# Patient Record
Sex: Female | Born: 1955 | Race: Black or African American | Hispanic: No | Smoking: Current every day smoker
Health system: Southern US, Community
[De-identification: ages and names within clinical notes are randomized; demographics above are authoritative.]

## PROBLEM LIST (undated history)

## (undated) DIAGNOSIS — C801 Malignant (primary) neoplasm, unspecified: Secondary | ICD-10-CM

## (undated) DIAGNOSIS — Z720 Tobacco use: Secondary | ICD-10-CM

## (undated) DIAGNOSIS — Z9221 Personal history of antineoplastic chemotherapy: Secondary | ICD-10-CM

## (undated) DIAGNOSIS — D869 Sarcoidosis, unspecified: Secondary | ICD-10-CM

## (undated) DIAGNOSIS — Z923 Personal history of irradiation: Secondary | ICD-10-CM

## (undated) DIAGNOSIS — C50912 Malignant neoplasm of unspecified site of left female breast: Secondary | ICD-10-CM

## (undated) DIAGNOSIS — J449 Chronic obstructive pulmonary disease, unspecified: Secondary | ICD-10-CM

## (undated) DIAGNOSIS — E119 Type 2 diabetes mellitus without complications: Secondary | ICD-10-CM

## (undated) DIAGNOSIS — I427 Cardiomyopathy due to drug and external agent: Secondary | ICD-10-CM

## (undated) DIAGNOSIS — I10 Essential (primary) hypertension: Secondary | ICD-10-CM

## (undated) DIAGNOSIS — L509 Urticaria, unspecified: Secondary | ICD-10-CM

## (undated) DIAGNOSIS — T451X5A Adverse effect of antineoplastic and immunosuppressive drugs, initial encounter: Secondary | ICD-10-CM

## (undated) HISTORY — DX: Cardiomyopathy due to drug and external agent: I42.7

## (undated) HISTORY — PX: OTHER SURGICAL HISTORY: SHX169

## (undated) HISTORY — PX: DILATION AND CURETTAGE OF UTERUS: SHX78

## (undated) HISTORY — DX: Tobacco use: Z72.0

## (undated) HISTORY — PX: GANGLION CYST EXCISION: SHX1691

## (undated) HISTORY — PX: ABDOMINAL HYSTERECTOMY: SHX81

## (undated) HISTORY — DX: Chronic obstructive pulmonary disease, unspecified: J44.9

## (undated) HISTORY — DX: Malignant neoplasm of unspecified site of left female breast: C50.912

## (undated) HISTORY — PX: ECTOPIC PREGNANCY SURGERY: SHX613

## (undated) HISTORY — DX: Urticaria, unspecified: L50.9

## (undated) HISTORY — PX: INGUINAL HERNIA REPAIR: SUR1180

## (undated) HISTORY — DX: Sarcoidosis, unspecified: D86.9

## (undated) HISTORY — DX: Adverse effect of antineoplastic and immunosuppressive drugs, initial encounter: T45.1X5A

---

## 2001-10-23 ENCOUNTER — Ambulatory Visit (HOSPITAL_COMMUNITY): Admission: RE | Admit: 2001-10-23 | Discharge: 2001-10-23 | Payer: Self-pay | Admitting: Internal Medicine

## 2001-10-23 ENCOUNTER — Encounter: Payer: Self-pay | Admitting: Internal Medicine

## 2002-06-09 ENCOUNTER — Encounter: Payer: Self-pay | Admitting: Internal Medicine

## 2002-06-09 ENCOUNTER — Ambulatory Visit (HOSPITAL_COMMUNITY): Admission: RE | Admit: 2002-06-09 | Discharge: 2002-06-09 | Payer: Self-pay | Admitting: Internal Medicine

## 2002-11-11 ENCOUNTER — Encounter: Payer: Self-pay | Admitting: Internal Medicine

## 2002-11-11 ENCOUNTER — Ambulatory Visit (HOSPITAL_COMMUNITY): Admission: RE | Admit: 2002-11-11 | Discharge: 2002-11-11 | Payer: Self-pay | Admitting: Internal Medicine

## 2003-02-12 ENCOUNTER — Ambulatory Visit (HOSPITAL_COMMUNITY): Admission: RE | Admit: 2003-02-12 | Discharge: 2003-02-12 | Payer: Self-pay | Admitting: Internal Medicine

## 2003-02-12 ENCOUNTER — Encounter: Payer: Self-pay | Admitting: Internal Medicine

## 2004-12-06 ENCOUNTER — Ambulatory Visit (HOSPITAL_COMMUNITY): Admission: RE | Admit: 2004-12-06 | Discharge: 2004-12-06 | Payer: Self-pay | Admitting: Internal Medicine

## 2005-12-08 ENCOUNTER — Ambulatory Visit (HOSPITAL_COMMUNITY): Admission: RE | Admit: 2005-12-08 | Discharge: 2005-12-08 | Payer: Self-pay | Admitting: Internal Medicine

## 2006-03-01 ENCOUNTER — Ambulatory Visit (HOSPITAL_COMMUNITY): Admission: RE | Admit: 2006-03-01 | Discharge: 2006-03-01 | Payer: Self-pay | Admitting: Internal Medicine

## 2008-01-17 ENCOUNTER — Ambulatory Visit (HOSPITAL_COMMUNITY): Admission: RE | Admit: 2008-01-17 | Discharge: 2008-01-17 | Payer: Self-pay | Admitting: Internal Medicine

## 2008-01-22 ENCOUNTER — Ambulatory Visit (HOSPITAL_COMMUNITY): Admission: RE | Admit: 2008-01-22 | Discharge: 2008-01-22 | Payer: Self-pay | Admitting: Internal Medicine

## 2008-03-04 ENCOUNTER — Ambulatory Visit: Payer: Self-pay | Admitting: Orthopedic Surgery

## 2008-03-04 DIAGNOSIS — M6740 Ganglion, unspecified site: Secondary | ICD-10-CM | POA: Insufficient documentation

## 2008-07-27 ENCOUNTER — Ambulatory Visit (HOSPITAL_COMMUNITY): Admission: RE | Admit: 2008-07-27 | Discharge: 2008-07-27 | Payer: Self-pay | Admitting: Internal Medicine

## 2009-05-27 ENCOUNTER — Ambulatory Visit (HOSPITAL_COMMUNITY): Admission: RE | Admit: 2009-05-27 | Discharge: 2009-05-27 | Payer: Self-pay | Admitting: Internal Medicine

## 2010-06-22 ENCOUNTER — Ambulatory Visit (HOSPITAL_COMMUNITY): Admission: RE | Admit: 2010-06-22 | Discharge: 2010-06-22 | Payer: Self-pay | Admitting: Internal Medicine

## 2011-01-14 ENCOUNTER — Encounter: Payer: Self-pay | Admitting: Internal Medicine

## 2011-01-15 ENCOUNTER — Encounter: Payer: Self-pay | Admitting: Internal Medicine

## 2011-11-12 DIAGNOSIS — I10 Essential (primary) hypertension: Secondary | ICD-10-CM | POA: Insufficient documentation

## 2012-01-02 ENCOUNTER — Other Ambulatory Visit (HOSPITAL_COMMUNITY): Payer: Self-pay | Admitting: Internal Medicine

## 2012-01-02 DIAGNOSIS — Z139 Encounter for screening, unspecified: Secondary | ICD-10-CM

## 2012-01-03 ENCOUNTER — Other Ambulatory Visit: Payer: Self-pay

## 2012-01-03 ENCOUNTER — Telehealth: Payer: Self-pay

## 2012-01-03 DIAGNOSIS — Z139 Encounter for screening, unspecified: Secondary | ICD-10-CM

## 2012-01-03 NOTE — Telephone Encounter (Signed)
MOVI PREP SPLIT DOSING, REGULAR BREAKFAST. CLEAR LIQUIDS AFTER 9 AM.  

## 2012-01-03 NOTE — Telephone Encounter (Signed)
FAXING RX AND INSTRUCTIONS TO [facility] ON [address] IN [place]....FAX NUMBER  [phone]

## 2012-01-03 NOTE — Telephone Encounter (Signed)
Gastroenterology Pre-Procedure Form   Request Date: 01/03/2012       Requesting Physician: Dr. Felecia Shelling     PATIENT INFORMATION:  Sally [name] is a 56 y.o., female (DOB=[birth year]).  PROCEDURE: Procedure(s) requested: colonoscopy Procedure Reason: screening for colon cancer  PATIENT REVIEW QUESTIONS: The patient reports the following:   1. Diabetes Melitis: no 2. Joint replacements in the past 12 months: no 3. Major health problems in the past 3 months: no 4. Has an artificial valve or MVP:no 5. Has been advised in past to take antibiotics in advance of a procedure like teeth cleaning: no}    MEDICATIONS & ALLERGIES:    Patient reports the following regarding taking any blood thinners:   Plavix? no Aspirin?no Coumadin?  no  Patient confirms/reports the following medications:  Current Outpatient Prescriptions  Medication Sig Dispense Refill  . atorvastatin (LIPITOR) 10 MG tablet Take 10 mg by mouth daily.      . NON FORMULARY OTC allergy pills..... Only as needed      . triamterene-hydrochlorothiazide (DYAZIDE) 37.5-25 MG per capsule Take 1 capsule by mouth once.        Patient confirms/reports the following allergies:  No Known Allergies  Patient is appropriate to schedule for requested procedure(s): yes  AUTHORIZATION INFORMATION Primary Insurance:   ID #:   Group #:  Pre-Cert / Auth required Pre-Cert / Auth #:   Secondary Insurance: ID #:   Group #:  Pre-Cert / Auth required:  Pre-Cert / Auth #:  No orders of the defined types were placed in this encounter.    SCHEDULE INFORMATION: Procedure has been scheduled as follows:  Date: 01/09/2012         Time: 1:30 PM  Location: [facility] Short Stay  This Gastroenterology Pre-Precedure Form is being routed to the following provider(s) for review: Jonette Eva, MD  CAN [facility] AND [facility] [place] ON [address]

## 2012-01-04 ENCOUNTER — Ambulatory Visit (HOSPITAL_COMMUNITY)
Admission: RE | Admit: 2012-01-04 | Discharge: 2012-01-04 | Disposition: A | Discharge status: Home / Self Care | Payer: BC Managed Care – PPO | Source: Ambulatory Visit | Attending: Internal Medicine | Admitting: Internal Medicine

## 2012-01-04 DIAGNOSIS — Z1231 Encounter for screening mammogram for malignant neoplasm of breast: Secondary | ICD-10-CM | POA: Insufficient documentation

## 2012-01-04 DIAGNOSIS — Z139 Encounter for screening, unspecified: Secondary | ICD-10-CM

## 2012-01-05 ENCOUNTER — Encounter (HOSPITAL_COMMUNITY): Payer: Self-pay

## 2012-01-08 MED ORDER — SODIUM CHLORIDE 0.45 % IV SOLN
Freq: Once | INTRAVENOUS | Status: AC
  Administered 2012-01-09: 1000 mL via INTRAVENOUS

## 2012-01-09 ENCOUNTER — Encounter (HOSPITAL_COMMUNITY): Payer: Self-pay | Admitting: *Deleted

## 2012-01-09 ENCOUNTER — Ambulatory Visit (HOSPITAL_COMMUNITY)
Admission: RE | Admit: 2012-01-09 | Discharge: 2012-01-09 | Disposition: A | Discharge status: Home / Self Care | Payer: BC Managed Care – PPO | Source: Ambulatory Visit | Attending: Gastroenterology | Admitting: Gastroenterology

## 2012-01-09 ENCOUNTER — Encounter (HOSPITAL_COMMUNITY)
Admission: RE | Disposition: A | Discharge status: Home / Self Care | Payer: Self-pay | Source: Ambulatory Visit | Attending: Gastroenterology

## 2012-01-09 ENCOUNTER — Other Ambulatory Visit: Payer: Self-pay | Admitting: Gastroenterology

## 2012-01-09 DIAGNOSIS — K5730 Diverticulosis of large intestine without perforation or abscess without bleeding: Secondary | ICD-10-CM | POA: Insufficient documentation

## 2012-01-09 DIAGNOSIS — Z1211 Encounter for screening for malignant neoplasm of colon: Secondary | ICD-10-CM

## 2012-01-09 DIAGNOSIS — D126 Benign neoplasm of colon, unspecified: Secondary | ICD-10-CM

## 2012-01-09 DIAGNOSIS — D128 Benign neoplasm of rectum: Secondary | ICD-10-CM | POA: Insufficient documentation

## 2012-01-09 DIAGNOSIS — Z79899 Other long term (current) drug therapy: Secondary | ICD-10-CM | POA: Insufficient documentation

## 2012-01-09 DIAGNOSIS — K648 Other hemorrhoids: Secondary | ICD-10-CM

## 2012-01-09 DIAGNOSIS — I10 Essential (primary) hypertension: Secondary | ICD-10-CM | POA: Insufficient documentation

## 2012-01-09 DIAGNOSIS — Z139 Encounter for screening, unspecified: Secondary | ICD-10-CM

## 2012-01-09 HISTORY — DX: Essential (primary) hypertension: I10

## 2012-01-09 HISTORY — PX: COLONOSCOPY: SHX5424

## 2012-01-09 SURGERY — COLONOSCOPY
Anesthesia: Moderate Sedation

## 2012-01-09 MED ORDER — MEPERIDINE HCL 100 MG/ML IJ SOLN
INTRAMUSCULAR | Status: AC
  Filled 2012-01-09: qty 2

## 2012-01-09 MED ORDER — MEPERIDINE HCL 100 MG/ML IJ SOLN
INTRAMUSCULAR | Status: DC | PRN
Start: 2012-01-09 — End: 2012-01-09
  Administered 2012-01-09: 50 mg via INTRAVENOUS

## 2012-01-09 MED ORDER — MIDAZOLAM HCL 5 MG/5ML IJ SOLN
INTRAMUSCULAR | Status: DC | PRN
  Administered 2012-01-09: 2 mg via INTRAVENOUS
  Administered 2012-01-09: 1 mg via INTRAVENOUS

## 2012-01-09 MED ORDER — MIDAZOLAM HCL 5 MG/5ML IJ SOLN
INTRAMUSCULAR | Status: AC
  Filled 2012-01-09: qty 10

## 2012-01-09 NOTE — Op Note (Addendum)
[facility] [address] [place], [place]  [address]  COLONOSCOPY PROCEDURE REPORT  PATIENT:  Sally, [name]  MR#:  [id number] BIRTHDATE:  [birth year], 55 yrs. old  GENDER:  female  ENDOSCOPIST:  Jonette Eva, MD REF. BY:  Glenice Laine, M.D. ASSISTANT:  PROCEDURE DATE:  01/09/2012 PROCEDURE:  Colonoscopy with biopsy  INDICATIONS:  SCREENING  MEDICATIONS:   Demerol 50 mg IV, Versed 3 mg IV  DESCRIPTION OF PROCEDURE:    Physical exam was performed. Informed consent was obtained from the patient after explaining the benefits, risks, and alternatives to procedure.  The patient was connected to monitor and placed in left lateral position. Continuous oxygen was provided by nasal cannula and IV medicine administered through an indwelling cannula.  After administration of sedation and rectal exam, the patient's rectum was intubated and the EC-[id number] ([id number]) colonoscope was advanced under direct visualization to the cecum.  The scope was removed slowly by carefully examining the color, texture, anatomy, and integrity mucosa on the way out.  The patient was recovered in endoscopy and discharged home in satisfactory condition. <<PROCEDUREIMAGES>>  FINDINGS:  There were THREE(2-3 MM)  polyps identified and removed in the rectum VIA COLD FORCEPS.  RARE Diverticula were found in the ascending colon.  SMALL Internal Hemorrhoids were found.  PREP QUALITY: EXCELLENT CECAL W/D TIME:    11 minutes  COMPLICATIONS:    None  ENDOSCOPIC IMPRESSION: 1) Polyps, multiple in the rectum 2) MILD DiverticulOSIS in the ascending colon 3) Internal hemorrhoids  RECOMMENDATIONS: TCS IN 10 YEARS HIGH FIBER DIET AWAIT BIOPSIES  REPEAT EXAM:  No  ______________________________ Jonette Eva, MD  CC:  Glenice Laine, M.D.  n. REVISED:  01/09/2012 03:05 PM eSIGNED:   Exavier Lina at 01/09/2012 03:05 PM  [place], [place], [phone]

## 2012-01-09 NOTE — H&P (Signed)
  Primary Care Physician:  Avon Gully, MD Primary Gastroenterologist:  Dr. Darrick Penna  Pre-Procedure History & Physical: HPI:  Sally [name] is a 56 y.o. female here for COLON CANCER SCREENING.   Past Medical History  Diagnosis Date  . Chronic sinusitis   . Hypertension     Past Surgical History  Procedure Date  . Inguinal hernia repair   . Ectopic pregnancy surgery   . Abdominal hysterectomy   . Left leg surgery     17 yrs of age  . Ganglion cyst excision   . Dilation and curettage of uterus     Prior to Admission medications   Medication Sig Start Date End Date Taking? Authorizing Provider  atorvastatin (LIPITOR) 10 MG tablet Take 10 mg by mouth daily.   Yes Historical Provider, MD  ibuprofen (ADVIL,MOTRIN) 200 MG tablet Take 200-400 mg by mouth every 6 (six) hours as needed. For Ankle Pain   Yes Historical Provider, MD  NON FORMULARY OTC allergy pills..... Only as needed   Yes Historical Provider, MD  polyvinyl alcohol (LIQUIFILM TEARS) 1.4 % ophthalmic solution Place 2 drops into both eyes as needed. For Dry Eyes   Yes Historical Provider, MD  triamterene-hydrochlorothiazide (DYAZIDE) 37.5-25 MG per capsule Take 1 capsule by mouth daily.    Yes Historical Provider, MD    Allergies as of 01/03/2012  . (No Known Allergies)    Family History  Problem Relation Age of Onset  . Diabetes type II Mother   . Diabetes type II Father   . Prostate cancer Father   NO COLON CA OR POLYPS   History   Social History  . Marital Status: Divorced    Spouse Name: N/A    Number of Children: N/A  . Years of Education: N/A   Occupational History  . Not on file.   Social History Main Topics  . Smoking status: Current Everyday Smoker -- 1.0 packs/day    Types: Cigarettes  . Smokeless tobacco: Not on file  . Alcohol Use: Yes     social beer  . Drug Use: No  . Sexually Active: Yes   Other Topics Concern  . Not on file   Social History Narrative  . No narrative on file      Review of Systems: See HPI, otherwise negative ROS   Physical Exam: BP 128/81  Pulse 82  Temp(Src) 97.5 F (36.4 C) (Oral)  Resp 18  Ht 5\' 1"  (1.549 m)  Wt 157 lb (71.215 kg)  BMI 29.67 kg/m2  SpO2 98% General:   Alert,  pleasant and cooperative in NAD Head:  Normocephalic and atraumatic. Neck:  Supple;  Lungs:  Clear throughout to auscultation.    Heart:  Regular rate and rhythm. Abdomen:  Soft, nontender and nondistended. Normal bowel sounds, without guarding, and without rebound.   Neurologic:  Alert and  oriented x4;  grossly normal neurologically.  Impression/Plan:    AVERAGE RISK  PLAN: TCS TODAY

## 2012-01-10 ENCOUNTER — Other Ambulatory Visit: Payer: Self-pay | Admitting: Internal Medicine

## 2012-01-10 DIAGNOSIS — R928 Other abnormal and inconclusive findings on diagnostic imaging of breast: Secondary | ICD-10-CM

## 2012-01-11 ENCOUNTER — Telehealth: Payer: Self-pay | Admitting: Gastroenterology

## 2012-01-11 NOTE — Telephone Encounter (Signed)
Called, many rings and no answer.

## 2012-01-11 NOTE — Telephone Encounter (Signed)
Results Cc to PCP  

## 2012-01-11 NOTE — Telephone Encounter (Signed)
Please call pt. She had HYPERPLASTIC POLYPS removed from her colon. TCS in 10 years. High fiber diet.  

## 2012-01-15 NOTE — Telephone Encounter (Signed)
Called home, many rings and no answer. Called mobile, constant busy. Mailed letter to call.

## 2012-01-16 ENCOUNTER — Encounter (HOSPITAL_COMMUNITY): Payer: Self-pay | Admitting: Gastroenterology

## 2012-01-18 NOTE — Progress Notes (Unsigned)
Pt called ;and was informed of her results of colonoscopy. ( Please see note dated 01/11/2012).

## 2012-01-22 NOTE — Progress Notes (Signed)
There is a reminder in epic for patient to have tcs in 10 years

## 2012-01-24 ENCOUNTER — Ambulatory Visit (HOSPITAL_COMMUNITY)
Admission: RE | Admit: 2012-01-24 | Discharge: 2012-01-24 | Disposition: A | Discharge status: Home / Self Care | Payer: BC Managed Care – PPO | Source: Ambulatory Visit | Attending: Internal Medicine | Admitting: Internal Medicine

## 2012-01-24 ENCOUNTER — Other Ambulatory Visit: Payer: Self-pay | Admitting: Internal Medicine

## 2012-01-24 DIAGNOSIS — R928 Other abnormal and inconclusive findings on diagnostic imaging of breast: Secondary | ICD-10-CM | POA: Insufficient documentation

## 2013-09-02 ENCOUNTER — Other Ambulatory Visit (HOSPITAL_COMMUNITY): Payer: Self-pay | Admitting: Internal Medicine

## 2013-09-02 DIAGNOSIS — Z09 Encounter for follow-up examination after completed treatment for conditions other than malignant neoplasm: Secondary | ICD-10-CM

## 2013-09-11 ENCOUNTER — Ambulatory Visit (HOSPITAL_COMMUNITY)
Admission: RE | Admit: 2013-09-11 | Discharge: 2013-09-11 | Disposition: A | Discharge status: Home / Self Care | Payer: BC Managed Care – PPO | Source: Ambulatory Visit | Attending: Internal Medicine | Admitting: Internal Medicine

## 2013-09-11 DIAGNOSIS — Z09 Encounter for follow-up examination after completed treatment for conditions other than malignant neoplasm: Secondary | ICD-10-CM

## 2013-09-11 DIAGNOSIS — Z1231 Encounter for screening mammogram for malignant neoplasm of breast: Secondary | ICD-10-CM | POA: Insufficient documentation

## 2013-09-16 ENCOUNTER — Other Ambulatory Visit: Payer: Self-pay | Admitting: Internal Medicine

## 2013-09-16 DIAGNOSIS — R928 Other abnormal and inconclusive findings on diagnostic imaging of breast: Secondary | ICD-10-CM

## 2013-09-24 ENCOUNTER — Encounter (HOSPITAL_COMMUNITY): Payer: Self-pay

## 2013-09-24 ENCOUNTER — Ambulatory Visit (HOSPITAL_COMMUNITY)
Admission: RE | Admit: 2013-09-24 | Discharge: 2013-09-24 | Disposition: A | Discharge status: Home / Self Care | Payer: BC Managed Care – PPO | Source: Ambulatory Visit | Attending: Internal Medicine | Admitting: Internal Medicine

## 2013-09-24 ENCOUNTER — Other Ambulatory Visit (HOSPITAL_COMMUNITY): Payer: Self-pay | Admitting: Internal Medicine

## 2013-09-24 ENCOUNTER — Ambulatory Visit
Admission: RE | Admit: 2013-09-24 | Discharge: 2013-09-24 | Disposition: A | Discharge status: Home / Self Care | Payer: BC Managed Care – PPO | Source: Ambulatory Visit | Attending: Internal Medicine | Admitting: Internal Medicine

## 2013-09-24 ENCOUNTER — Other Ambulatory Visit: Payer: Self-pay | Admitting: Internal Medicine

## 2013-09-24 DIAGNOSIS — R928 Other abnormal and inconclusive findings on diagnostic imaging of breast: Secondary | ICD-10-CM

## 2013-09-24 DIAGNOSIS — C50919 Malignant neoplasm of unspecified site of unspecified female breast: Secondary | ICD-10-CM | POA: Insufficient documentation

## 2013-09-24 MED ORDER — LIDOCAINE HCL (PF) 2 % IJ SOLN
10.0000 mL | Freq: Once | INTRAMUSCULAR | Status: AC
  Administered 2013-09-24: 10 mL
  Filled 2013-09-24: qty 10

## 2013-09-24 MED ORDER — LIDOCAINE HCL (PF) 2 % IJ SOLN
INTRAMUSCULAR | Status: AC
  Filled 2013-09-24: qty 10

## 2013-09-24 NOTE — Progress Notes (Signed)
Biopsy complete no signs of distress  

## 2013-09-26 ENCOUNTER — Other Ambulatory Visit: Payer: Self-pay | Admitting: Internal Medicine

## 2013-09-26 DIAGNOSIS — C50912 Malignant neoplasm of unspecified site of left female breast: Secondary | ICD-10-CM

## 2013-10-03 ENCOUNTER — Ambulatory Visit (HOSPITAL_COMMUNITY)
Admission: RE | Admit: 2013-10-03 | Discharge: 2013-10-03 | Disposition: A | Discharge status: Home / Self Care | Payer: BC Managed Care – PPO | Source: Ambulatory Visit | Attending: Internal Medicine | Admitting: Internal Medicine

## 2013-10-03 DIAGNOSIS — C50912 Malignant neoplasm of unspecified site of left female breast: Secondary | ICD-10-CM

## 2013-10-03 DIAGNOSIS — C50419 Malignant neoplasm of upper-outer quadrant of unspecified female breast: Secondary | ICD-10-CM | POA: Insufficient documentation

## 2013-10-03 MED ORDER — SODIUM CHLORIDE 0.9 % IV SOLN
INTRAVENOUS | Status: AC
  Filled 2013-10-03: qty 150

## 2013-10-03 MED ORDER — GADOBENATE DIMEGLUMINE 529 MG/ML IV SOLN
15.0000 mL | Freq: Once | INTRAVENOUS | Status: AC | PRN
  Administered 2013-10-03: 15 mL via INTRAVENOUS

## 2013-10-09 ENCOUNTER — Other Ambulatory Visit: Payer: Self-pay | Admitting: Internal Medicine

## 2013-10-09 DIAGNOSIS — R928 Other abnormal and inconclusive findings on diagnostic imaging of breast: Secondary | ICD-10-CM

## 2013-10-10 ENCOUNTER — Other Ambulatory Visit: Payer: Self-pay | Admitting: Internal Medicine

## 2013-10-10 DIAGNOSIS — R928 Other abnormal and inconclusive findings on diagnostic imaging of breast: Secondary | ICD-10-CM

## 2013-10-13 ENCOUNTER — Ambulatory Visit
Admission: RE | Admit: 2013-10-13 | Discharge: 2013-10-13 | Disposition: A | Discharge status: Home / Self Care | Payer: BC Managed Care – PPO | Source: Ambulatory Visit | Attending: Internal Medicine | Admitting: Internal Medicine

## 2013-10-13 ENCOUNTER — Other Ambulatory Visit: Payer: Self-pay | Admitting: Internal Medicine

## 2013-10-13 DIAGNOSIS — R928 Other abnormal and inconclusive findings on diagnostic imaging of breast: Secondary | ICD-10-CM

## 2013-10-14 ENCOUNTER — Other Ambulatory Visit: Payer: BC Managed Care – PPO

## 2013-10-17 ENCOUNTER — Ambulatory Visit
Admission: RE | Admit: 2013-10-17 | Discharge: 2013-10-17 | Disposition: A | Discharge status: Home / Self Care | Payer: BC Managed Care – PPO | Source: Ambulatory Visit | Attending: Internal Medicine | Admitting: Internal Medicine

## 2013-10-17 DIAGNOSIS — R928 Other abnormal and inconclusive findings on diagnostic imaging of breast: Secondary | ICD-10-CM

## 2013-10-17 MED ORDER — GADOBENATE DIMEGLUMINE 529 MG/ML IV SOLN
10.0000 mL | Freq: Once | INTRAVENOUS | Status: AC | PRN
  Administered 2013-10-17: 10 mL via INTRAVENOUS

## 2013-10-28 ENCOUNTER — Other Ambulatory Visit (HOSPITAL_COMMUNITY): Payer: Self-pay | Admitting: General Surgery

## 2013-10-28 DIAGNOSIS — N63 Unspecified lump in unspecified breast: Secondary | ICD-10-CM

## 2013-10-28 DIAGNOSIS — C50912 Malignant neoplasm of unspecified site of left female breast: Secondary | ICD-10-CM

## 2013-10-28 NOTE — H&P (Signed)
  NTS SOAP Note  Vital Signs:  Vitals as of: 10/28/2013: Systolic 135: Diastolic 85: Heart Rate 80: Temp 98.64F: Height 24ft 1in: Weight 148Lbs 0 Ounces: BMI 27.96  BMI : 27.96 kg/m2  Subjective: This 57 Years old Female presents for of left breast cancer.  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  Confirm by biopsy.  MRI shows suspicious area in the right breast.  No lump noted by patient,  No nipple discharge, family h/o breast cancer.  Review of Symptoms:  Constitutional:unremarkable   Head:unremarkable    Eyes:unremarkable   Nose/Mouth/Throat:unremarkable Cardiovascular:  unremarkable   Respiratory:unremarkable   Gastrointestinal:  unremarkable   Genitourinary:unremarkable     Musculoskeletal:unremarkable     as above Hematolgic/Lymphatic:unremarkable       sinus allergies   Past Medical History:    Reviewed   Past Medical History  Surgical History: TAH, leg surgery Medical Problems: HTN, high cholesterol Allergies: NKDA Medications: lipitor, triamterene/HCTZ   Social History:Reviewed  Social History  Preferred Language: English Race:  Black or African American Ethnicity: Not Hispanic / Latino Age: 58 Years 11 Months Marital Status:  S Alcohol: rarely Recreational drug(s): no   Smoking Status: Current every day smoker reviewed on 10/28/2013 Started Date: 12/25/1978 Packs per day: 1.00 Functional Status reviewed on mm/dd/yyyy ------------------------------------------------ Bathing: Normal Cooking: Normal Dressing: Normal Driving: Normal Eating: Normal Managing Meds: Normal Oral Care: Normal Shopping: Normal Toileting: Normal Transferring: Normal Walking: Normal Cognitive Status reviewed on mm/dd/yyyy ------------------------------------------------ Attention: Normal Decision Making: Normal Language: Normal Memory: Normal Motor: Normal Perception: Normal Problem Solving:  Normal Visual and Spatial: Normal   Family History:  Reviewed  Family Health History Mother, Deceased; Diabetes mellitus, unspecified type;  Father, Deceased; Healthy;     Objective Information: General:  Well appearing, well nourished in no distress. Neck:  Supple without lymphadenopathy.  Heart:  RRR, no murmur Lungs:    CTA bilaterally, no wheezes, rhonchi, rales.  Breathing unlabored.     No dominant mass, nipple discharge, dimpling.  Axillas negative for palpable nodes.  Assessment:Left breast carcinoma, clinical stage 1 Diagnosis &amp; Procedure Smart Code   Plan:Scheduled for left partial mastectomy after needle localization, sentinel lymph node biopsy, possible axillary dissection on 11/12/13.  Risks and benefits of procedure were fully explained to the patient, who gives informed consent.   Follow-up:Pending surgery

## 2013-11-05 ENCOUNTER — Encounter (HOSPITAL_COMMUNITY): Payer: Self-pay | Admitting: Pharmacy Technician

## 2013-11-06 ENCOUNTER — Ambulatory Visit (HOSPITAL_COMMUNITY): Payer: BC Managed Care – PPO

## 2013-11-06 ENCOUNTER — Encounter (HOSPITAL_COMMUNITY): Payer: Self-pay

## 2013-11-06 ENCOUNTER — Ambulatory Visit (HOSPITAL_COMMUNITY)
Admission: RE | Admit: 2013-11-06 | Discharge: 2013-11-06 | Disposition: A | Discharge status: Home / Self Care | Payer: BC Managed Care – PPO | Source: Ambulatory Visit | Attending: General Surgery | Admitting: General Surgery

## 2013-11-06 ENCOUNTER — Encounter (HOSPITAL_COMMUNITY)
Admission: RE | Admit: 2013-11-06 | Discharge: 2013-11-06 | Disposition: A | Discharge status: Home / Self Care | Payer: BC Managed Care – PPO | Source: Ambulatory Visit | Attending: General Surgery | Admitting: General Surgery

## 2013-11-06 DIAGNOSIS — C50919 Malignant neoplasm of unspecified site of unspecified female breast: Secondary | ICD-10-CM | POA: Insufficient documentation

## 2013-11-06 DIAGNOSIS — R9431 Abnormal electrocardiogram [ECG] [EKG]: Secondary | ICD-10-CM | POA: Insufficient documentation

## 2013-11-06 DIAGNOSIS — I454 Nonspecific intraventricular block: Secondary | ICD-10-CM | POA: Insufficient documentation

## 2013-11-06 DIAGNOSIS — F17200 Nicotine dependence, unspecified, uncomplicated: Secondary | ICD-10-CM | POA: Insufficient documentation

## 2013-11-06 DIAGNOSIS — J9819 Other pulmonary collapse: Secondary | ICD-10-CM | POA: Insufficient documentation

## 2013-11-06 HISTORY — DX: Type 2 diabetes mellitus without complications: E11.9

## 2013-11-06 LAB — COMPREHENSIVE METABOLIC PANEL
ALT: 33 U/L (ref 0–35)
AST: 32 U/L (ref 0–37)
CO2: 32 mEq/L (ref 19–32)
Calcium: 10.2 mg/dL (ref 8.4–10.5)
Chloride: 101 mEq/L (ref 96–112)
Creatinine, Ser: 1.08 mg/dL (ref 0.50–1.10)
GFR calc non Af Amer: 57 mL/min — ABNORMAL LOW (ref >90)
Sodium: 141 mEq/L (ref 135–145)
Total Protein: 7.7 g/dL (ref 6.0–8.3)

## 2013-11-06 LAB — CBC WITH DIFFERENTIAL/PLATELET
Basophils Absolute: 0 10*3/uL (ref 0.0–0.1)
Basophils Relative: 0 % (ref 0–1)
Eosinophils Absolute: 0.2 10*3/uL (ref 0.0–0.7)
Eosinophils Relative: 3 % (ref 0–5)
HCT: 40.2 % (ref 36.0–46.0)
Lymphocytes Relative: 33 % (ref 12–46)
MCH: 31.2 pg (ref 26.0–34.0)
MCHC: 34.3 g/dL (ref 30.0–36.0)
MCV: 90.7 fL (ref 78.0–100.0)
Monocytes Absolute: 0.5 10*3/uL (ref 0.1–1.0)
Neutrophils Relative %: 53 % (ref 43–77)
Platelets: 175 10*3/uL (ref 150–400)
RDW: 12.8 % (ref 11.5–15.5)

## 2013-11-06 NOTE — Patient Instructions (Signed)
Sally [name]  11/06/2013   Your procedure is scheduled on: 11/12/2013  Report to [facility] at  700  AM.  Call this number if you have problems the morning of surgery: [phone]   Remember:   Do not eat food or drink liquids after midnight.   Take these medicines the morning of surgery with A SIP OF WATER: claritin, dyazide   Do not wear jewelry, make-up or nail polish.  Do not wear lotions, powders, or perfumes.  Do not shave 48 hours prior to surgery. Men may shave face and neck.  Do not bring valuables to the hospital.  [facility] is not responsible for any belongings or valuables.               Contacts, dentures or bridgework may not be worn into surgery.  Leave suitcase in the car. After surgery it may be brought to your room.  For patients admitted to the hospital, discharge time is determined by your treatment team.               Patients discharged the day of surgery will not be allowed to drive home.  Name and phone number of your driver: family  Special Instructions: Shower using CHG 2 nights before surgery and the night before surgery.  If you shower the day of surgery use CHG.  Use special wash - you have one bottle of CHG for all showers.  You should use approximately 1/3 of the bottle for each shower.   Please read over the following fact sheets that you were given: Pain Booklet, Coughing and Deep Breathing, MRSA Information, Surgical Site Infection Prevention, Anesthesia Post-op Instructions and Care and Recovery After Surgery Sentinel Lymph Node Biopsy Sentinel lymph node biopsy is a procedure in which a single lymph node is identified, removed, and examined for cancer. Lymph nodes are collections of tissue that help filter infections, cancer cells, and other waste substances from the bloodstream. Certain types of cancer can spread to nearby lymph nodes. The cancer spreads to one lymph node first, and then to others. The first lymph node that your  cancer could spread to is called the sentinel lymph node. Examining the sentinel lymph node for cancer can help your caregiver plan future treatment for you. LET YOUR CAREGIVER KNOW ABOUT:   Allergies to food or medicine.  Medicines taken, including vitamins, herbs, eyedrops, over-the-counter medicines, and creams.  Use of steroids (by mouth or creams).  Previous problems with numbing medicines.  History of bleeding problems or blood clots.  Previous surgery.  Other health problems, including diabetes and kidney problems.  Possibility of pregnancy, if this applies. RISKS AND COMPLICATIONS   Infection.  Bleeding.  Allergic reaction to the dye used for the procedure.  Blue staining of the skin where the dye is injected.  Damaged lymph vessels, causing a buildup of fluid (lymphedema).  Pain or bruising at the biopsy site. BEFORE THE PROCEDURE   Stop smoking at least 2 weeks before the procedure. Not smoking will improve your health after the procedure and decrease the chance of getting a wound infection.  You may have blood tests to make sure your blood clots normally.  Ask your caregiver about changing or stopping your regular medicines.  Do not eat or drink anything for 8 hours before the procedure. PROCEDURE   You will be given medicine that makes you sleep (general anesthetic).  A blue, radioactive dye will be  injected near the tumor. The dye will then spread into the sentinel lymph node.  A scanner will identify the sentinel lymph node.  A small cut (incision) will be made, and the sentinel lymph node will be removed.  The sentinel lymph node will be examined in a lab. Sometimes, a sentinel lymph node biopsy is performed during another surgery, such as a mastectomy or lumpectomy for breast cancer.  AFTER THE PROCEDURE   You will go to a recovery room.  You will be monitored for several hours.  If complications do not occur, you will be allowed to go home a  few hours after the procedure.  Your urine may be blue for the next 24 hours. This is normal. It is caused by the dye used during the procedure.  Your skin where the dye was injected may be blue for up to 8 weeks. Document Released: 03/04/2012 Document Reviewed: 03/04/2012 [facility] Patient Information 2014 [place], [place]. Sentinel Lymph Node Analysis in Breast Cancer Treatment WHAT IS A LYMPH NODE? Lymph nodes are little glands that lie along the lymph channels and serve to trap infections in the body. These are the small vessel-like structures that carry the fluid (lymph) from body tissues away to be filtered. These are the "glands" that feel swollen in the neck when you or your child has a sore throat. These glands in your armpit are where breast cancer tends to spread first. WHAT IS SENTINEL LYMPH NODE ANALYSIS? Sentinel lymph node study is a new cancer diagnostic procedure. It aims to look at the most likely first spread of breast cancer. It involves looking at the lymph node or nodes where breast cancer is likely to travel next.  PROCEDURE  A small amount of blue dye and radioactive tracer are injected around the tumor in the breast. The dye and tracer follow the same path that a spreading cancer would be likely to follow. With the use of a Conservation officer, nature, the radioactive tracer can be located in the lymph node that is the gatekeeper to other lymph nodes in the armpit. The sentinel lymph node is the first lymph node in a chain of lymph nodes that drain the lymph from the breast. The blue dye enables the surgeon to identify this sentinel node. This node can be removed and examined. If no cancer is found in this node, no further removal of lymph nodes is recommended. In this case, the spread of cancer to the other lymph nodes is rare. This eliminates any more surgery in the armpit and risk of complications. If the lymph node shows spread of the cancer from the breast, the other lymph nodes in the  armpit are removed and analyzed. This helps the doctor and patient decide how much more surgery is needed and if chemotherapy and/or radiation treatment is necessary following the surgery.  BENEFITS  The pathology tests for this procedure are much more sensitive than was previously available.  This technique is thought to be a major improvement in the treatment of breast cancer.  This procedure allows many patients to avoid the effects of more extensive underarm lymph node removal and risk of complications (infection, bleeding, or severe arm swelling).  Survival rates from breast cancer are better and the risk of complications isreduced. Document Released: 12/11/2005 Document Revised: 03/04/2012 Document Reviewed: 08/27/2007 [facility] Patient Information 2014 [place], [place]. Breast Biopsy A breast biopsy is a procedure where a sample of breast tissue is removed from your breast. The tissue is examined under a  microscope to see if cancerous cells are present. A breast biopsy is done when there is:  Any undiagnosed breast mass (tumor).  Nipple abnormalities, dimpling, crusting, or ulcerations.  Abnormal discharge from the nipple, especially blood.  Redness, swelling, and pain of the breast.  Calcium deposits (calcifications) or abnormalities seen on a mammogram, ultrasound result, or results of magnetic resonance imaging (MRI).  Suspicious changes in the breast seen on your mammogram. If the tumor is found to be cancerous (malignant), a breast biopsy can help to determine what the best treatment is for you. There are many different types of breast biopsies. Talk to your caregiver about your options and which type is best for you. LET YOUR CAREGIVER KNOW ABOUT:  Allergies to food or medicine.  Medicines taken, including vitamins, herbs, eyedrops, over-the-counter medicines, and creams.  Use of steroids (by mouth or creams).  Previous problems with anesthetics or numbing  medicines.  History of bleeding problems or blood clots.  Previous surgery.  Other health problems, including diabetes and kidney problems.  Any recent colds or infections.  Possibility of pregnancy, if this applies. RISKS AND COMPLICATIONS   Bleeding.  Infection.  Allergy to medicines.  Bruising and swelling of the breast.  Alteration in the shape of the breast.  Not finding the lump or abnormality.  Needing more surgery. BEFORE THE PROCEDURE  Arrange for someone to drive you home after the procedure.  Do not smoke for 2 weeks before the procedure. Stop smoking, if you smoke.  Do not drink alcohol for 24 hours before procedure.  Wear a good support bra to the procedure. PROCEDURE  You may be given a medicine to numb the breast area (local anesthesia) or a medicine to make you sleep (general anesthesia) during the procedure. The following are the different types of biopsies that can be performed.   Fine-needle aspiration A thin needle is attached to a syringe and inserted into the breast lump. Fluid and cells are removed and then looked at under a microscope. If the breast lump cannot be felt, an ultrasound may be used to help locate the lump and place the needle in the correct area.   Core needle biopsy A wide, hollow needle (core needle) is inserted into the breast lump 3 6 times to get tissue samples or cores. The samples are removed. The needle is usually placed in the correct area by using an ultrasound or X-ray.   Stereotactic biopsy X-ray equipment and a computer are used to analyze X-ray pictures of the breast lump. The computer then finds exactly where the core needle needs to be inserted. Tissue samples are removed.   Vacuum-assisted biopsy A small incision (less than  inch) is made in your breast. A biopsy device that includes a hollow needle and vacuum is passed through the incision and into the breast tissue. The vacuum gently draws abnormal breast tissue  into the needle to remove it. This type of biopsy removes a larger tissue sample than a regular core needle biopsy. No stitches are needed, and there is usually little scarring.  Ultrasound-guided core needle biopsy A high frequency ultrasound helps guide the core needle to the area of the mass or abnormality. An incision is made to insert the needle. Tissue samples are removed.  Open biopsy A larger incision is made in the breast. Your caregiver will attempt to remove the whole breast lump or as much as possible. AFTER THE PROCEDURE  You will be taken to the recovery area. If  you are doing well and have no problems, you will be allowed to go home.  You may notice bruising on your breast. This is normal.  Your caregiver may apply a pressure dressing on your breast for 24 48 hours. A pressure dressing is a bandage that is wrapped tightly around the chest to stop fluid from collecting underneath tissues. Document Released: 12/11/2005 Document Revised: 04/07/2013 Document Reviewed: 01/11/2012 [facility] Patient Information 2014 [place], [place]. PATIENT INSTRUCTIONS POST-ANESTHESIA  IMMEDIATELY FOLLOWING SURGERY:  Do not drive or operate machinery for the first twenty four hours after surgery.  Do not make any important decisions for twenty four hours after surgery or while taking narcotic pain medications or sedatives.  If you develop intractable nausea and vomiting or a severe headache please notify your doctor immediately.  FOLLOW-UP:  Please make an appointment with your surgeon as instructed. You do not need to follow up with anesthesia unless specifically instructed to do so.  WOUND CARE INSTRUCTIONS (if applicable):  Keep a dry clean dressing on the anesthesia/puncture wound site if there is drainage.  Once the wound has quit draining you may leave it open to air.  Generally you should leave the bandage intact for twenty four hours unless there is drainage.  If the epidural site drains for more  than 36-48 hours please call the anesthesia department.  QUESTIONS?:  Please feel free to call your physician or the hospital operator if you have any questions, and they will be happy to assist you.

## 2013-11-12 ENCOUNTER — Ambulatory Visit (HOSPITAL_COMMUNITY)
Admission: RE | Admit: 2013-11-12 | Discharge: 2013-11-12 | Disposition: A | Discharge status: Home / Self Care | Payer: BC Managed Care – PPO | Source: Ambulatory Visit | Attending: General Surgery | Admitting: General Surgery

## 2013-11-12 ENCOUNTER — Encounter (HOSPITAL_COMMUNITY): Payer: BC Managed Care – PPO | Admitting: Anesthesiology

## 2013-11-12 ENCOUNTER — Encounter (HOSPITAL_COMMUNITY): Payer: Self-pay

## 2013-11-12 ENCOUNTER — Ambulatory Visit (HOSPITAL_COMMUNITY): Payer: BC Managed Care – PPO

## 2013-11-12 ENCOUNTER — Ambulatory Visit (HOSPITAL_COMMUNITY): Payer: BC Managed Care – PPO | Admitting: Anesthesiology

## 2013-11-12 ENCOUNTER — Encounter (HOSPITAL_COMMUNITY)
Admission: RE | Disposition: A | Discharge status: Home / Self Care | Payer: Self-pay | Source: Ambulatory Visit | Attending: General Surgery

## 2013-11-12 DIAGNOSIS — C50919 Malignant neoplasm of unspecified site of unspecified female breast: Secondary | ICD-10-CM | POA: Insufficient documentation

## 2013-11-12 DIAGNOSIS — E7800 Pure hypercholesterolemia, unspecified: Secondary | ICD-10-CM | POA: Insufficient documentation

## 2013-11-12 DIAGNOSIS — I10 Essential (primary) hypertension: Secondary | ICD-10-CM | POA: Insufficient documentation

## 2013-11-12 DIAGNOSIS — N63 Unspecified lump in unspecified breast: Secondary | ICD-10-CM

## 2013-11-12 DIAGNOSIS — F17200 Nicotine dependence, unspecified, uncomplicated: Secondary | ICD-10-CM | POA: Insufficient documentation

## 2013-11-12 DIAGNOSIS — Z01812 Encounter for preprocedural laboratory examination: Secondary | ICD-10-CM | POA: Insufficient documentation

## 2013-11-12 DIAGNOSIS — E119 Type 2 diabetes mellitus without complications: Secondary | ICD-10-CM | POA: Insufficient documentation

## 2013-11-12 DIAGNOSIS — C801 Malignant (primary) neoplasm, unspecified: Secondary | ICD-10-CM

## 2013-11-12 DIAGNOSIS — C50912 Malignant neoplasm of unspecified site of left female breast: Secondary | ICD-10-CM

## 2013-11-12 HISTORY — DX: Malignant (primary) neoplasm, unspecified: C80.1

## 2013-11-12 HISTORY — PX: PARTIAL MASTECTOMY WITH NEEDLE LOCALIZATION AND AXILLARY SENTINEL LYMPH NODE BX: SHX6009

## 2013-11-12 LAB — GLUCOSE, CAPILLARY: Glucose-Capillary: 94 mg/dL (ref 70–99)

## 2013-11-12 SURGERY — PARTIAL MASTECTOMY WITH NEEDLE LOCALIZATION AND AXILLARY SENTINEL LYMPH NODE BX
Anesthesia: General | Site: Breast | Laterality: Left | Wound class: Clean

## 2013-11-12 MED ORDER — TECHNETIUM TC 99M SULFUR COLLOID FILTERED
0.5000 | Freq: Once | INTRAVENOUS | Status: AC | PRN
  Administered 2013-11-12: 0.5 via INTRADERMAL

## 2013-11-12 MED ORDER — MIDAZOLAM HCL 2 MG/2ML IJ SOLN
1.0000 mg | INTRAMUSCULAR | Status: DC | PRN
  Administered 2013-11-12: 2 mg via INTRAVENOUS

## 2013-11-12 MED ORDER — PENTAFLUOROPROP-TETRAFLUOROETH EX AERO
INHALATION_SPRAY | CUTANEOUS | Status: AC
  Filled 2013-11-12: qty 103.5

## 2013-11-12 MED ORDER — MIDAZOLAM HCL 5 MG/5ML IJ SOLN
INTRAMUSCULAR | Status: DC | PRN
  Administered 2013-11-12: 2 mg via INTRAVENOUS

## 2013-11-12 MED ORDER — SUCCINYLCHOLINE CHLORIDE 20 MG/ML IJ SOLN
INTRAMUSCULAR | Status: AC
  Filled 2013-11-12: qty 1

## 2013-11-12 MED ORDER — ROCURONIUM BROMIDE 100 MG/10ML IV SOLN
INTRAVENOUS | Status: DC | PRN
  Administered 2013-11-12: 30 mg via INTRAVENOUS

## 2013-11-12 MED ORDER — HYDROCODONE-ACETAMINOPHEN 5-325 MG PO TABS: 1.0000 | ORAL_TABLET | Freq: Four times a day (QID) | ORAL | Status: DC | PRN

## 2013-11-12 MED ORDER — PROPOFOL 10 MG/ML IV BOLUS
INTRAVENOUS | Status: DC | PRN
  Administered 2013-11-12: 150 mg via INTRAVENOUS

## 2013-11-12 MED ORDER — PROPOFOL 10 MG/ML IV EMUL
INTRAVENOUS | Status: AC
  Filled 2013-11-12: qty 20

## 2013-11-12 MED ORDER — METHYLENE BLUE 1 % INJ SOLN
INTRAMUSCULAR | Status: AC
  Filled 2013-11-12: qty 2

## 2013-11-12 MED ORDER — MIDAZOLAM HCL 2 MG/2ML IJ SOLN
INTRAMUSCULAR | Status: AC
  Filled 2013-11-12: qty 2

## 2013-11-12 MED ORDER — FENTANYL CITRATE 0.05 MG/ML IJ SOLN
INTRAMUSCULAR | Status: AC
  Filled 2013-11-12: qty 5

## 2013-11-12 MED ORDER — BUPIVACAINE HCL (PF) 0.25 % IJ SOLN
INTRAMUSCULAR | Status: AC
  Filled 2013-11-12: qty 30

## 2013-11-12 MED ORDER — ONDANSETRON HCL 4 MG/2ML IJ SOLN
4.0000 mg | Freq: Once | INTRAMUSCULAR | Status: AC
  Administered 2013-11-12: 4 mg via INTRAVENOUS

## 2013-11-12 MED ORDER — CEFAZOLIN SODIUM-DEXTROSE 2-3 GM-% IV SOLR
INTRAVENOUS | Status: AC
  Filled 2013-11-12: qty 50

## 2013-11-12 MED ORDER — ENOXAPARIN SODIUM 40 MG/0.4ML ~~LOC~~ SOLN
SUBCUTANEOUS | Status: AC
  Filled 2013-11-12: qty 0.4

## 2013-11-12 MED ORDER — SUCCINYLCHOLINE CHLORIDE 20 MG/ML IJ SOLN
INTRAMUSCULAR | Status: DC | PRN
  Administered 2013-11-12: 150 mg via INTRAVENOUS

## 2013-11-12 MED ORDER — CHLORHEXIDINE GLUCONATE 4 % EX LIQD: 1.0000 "application " | Freq: Once | CUTANEOUS | Status: DC

## 2013-11-12 MED ORDER — FENTANYL CITRATE 0.05 MG/ML IJ SOLN
25.0000 ug | INTRAMUSCULAR | Status: DC | PRN
  Filled 2013-11-12: qty 2

## 2013-11-12 MED ORDER — ROCURONIUM BROMIDE 50 MG/5ML IV SOLN
INTRAVENOUS | Status: AC
  Filled 2013-11-12: qty 1

## 2013-11-12 MED ORDER — SODIUM CHLORIDE 0.9 % IJ SOLN
INTRAMUSCULAR | Status: DC | PRN
  Administered 2013-11-12: 3 mL

## 2013-11-12 MED ORDER — GLYCOPYRROLATE 0.2 MG/ML IJ SOLN
INTRAMUSCULAR | Status: AC
  Filled 2013-11-12: qty 2

## 2013-11-12 MED ORDER — NEOSTIGMINE METHYLSULFATE 1 MG/ML IJ SOLN
INTRAMUSCULAR | Status: DC | PRN
  Administered 2013-11-12: 1 mg via INTRAVENOUS
  Administered 2013-11-12: 2 mg via INTRAVENOUS

## 2013-11-12 MED ORDER — BUPIVACAINE HCL (PF) 0.5 % IJ SOLN
INTRAMUSCULAR | Status: AC
  Filled 2013-11-12: qty 30

## 2013-11-12 MED ORDER — GLYCOPYRROLATE 0.2 MG/ML IJ SOLN
INTRAMUSCULAR | Status: DC | PRN
  Administered 2013-11-12 (×2): 0.2 mg via INTRAVENOUS

## 2013-11-12 MED ORDER — FENTANYL CITRATE 0.05 MG/ML IJ SOLN
INTRAMUSCULAR | Status: DC | PRN
  Administered 2013-11-12: 100 ug via INTRAVENOUS
  Administered 2013-11-12: 50 ug via INTRAVENOUS
  Administered 2013-11-12: 100 ug via INTRAVENOUS

## 2013-11-12 MED ORDER — ONDANSETRON HCL 4 MG/2ML IJ SOLN
INTRAMUSCULAR | Status: AC
  Filled 2013-11-12: qty 2

## 2013-11-12 MED ORDER — SODIUM CHLORIDE 0.9 % IJ SOLN
INTRAMUSCULAR | Status: AC
  Filled 2013-11-12: qty 10

## 2013-11-12 MED ORDER — BUPIVACAINE HCL (PF) 0.5 % IJ SOLN
INTRAMUSCULAR | Status: DC | PRN
  Administered 2013-11-12: 11 mL

## 2013-11-12 MED ORDER — LACTATED RINGERS IV SOLN
INTRAVENOUS | Status: DC
  Administered 2013-11-12 (×2): via INTRAVENOUS

## 2013-11-12 MED ORDER — CEFAZOLIN SODIUM-DEXTROSE 2-3 GM-% IV SOLR
2.0000 g | INTRAVENOUS | Status: AC
  Administered 2013-11-12: 2 g via INTRAVENOUS

## 2013-11-12 MED ORDER — KETOROLAC TROMETHAMINE 30 MG/ML IJ SOLN
30.0000 mg | Freq: Once | INTRAMUSCULAR | Status: AC
  Administered 2013-11-12: 30 mg via INTRAVENOUS
  Filled 2013-11-12: qty 1

## 2013-11-12 MED ORDER — ONDANSETRON HCL 4 MG/2ML IJ SOLN
4.0000 mg | Freq: Once | INTRAMUSCULAR | Status: AC | PRN
  Administered 2013-11-12: 4 mg via INTRAVENOUS
  Filled 2013-11-12: qty 2

## 2013-11-12 MED ORDER — ENOXAPARIN SODIUM 40 MG/0.4ML ~~LOC~~ SOLN
40.0000 mg | Freq: Once | SUBCUTANEOUS | Status: AC
  Administered 2013-11-12: 40 mg via SUBCUTANEOUS

## 2013-11-12 MED ORDER — LIDOCAINE HCL (PF) 1 % IJ SOLN
INTRAMUSCULAR | Status: AC
  Filled 2013-11-12: qty 5

## 2013-11-12 MED ORDER — METHYLENE BLUE 1 % INJ SOLN
INTRAMUSCULAR | Status: DC | PRN
  Administered 2013-11-12: 2 mL via INTRADERMAL

## 2013-11-12 MED ORDER — LIDOCAINE HCL 1 % IJ SOLN
INTRAMUSCULAR | Status: DC | PRN
  Administered 2013-11-12: 50 mg via INTRADERMAL

## 2013-11-12 SURGICAL SUPPLY — 37 items
APPLIER CLIP 9.375 SM OPEN (CLIP) ×2
BAG HAMPER (MISCELLANEOUS) ×2 IMPLANT
BNDG CONFORM 6X.82 1P STRL (GAUZE/BANDAGES/DRESSINGS) ×2 IMPLANT
CLIP APPLIE 9.375 SM OPEN (CLIP) ×1 IMPLANT
CLOTH BEACON ORANGE TIMEOUT ST (SAFETY) ×2 IMPLANT
CONT SPECI 4OZ STER CLIK (MISCELLANEOUS) ×4 IMPLANT
COVER LIGHT HANDLE STERIS (MISCELLANEOUS) ×4 IMPLANT
COVER PROBE W GEL 5X96 (DRAPES) ×2 IMPLANT
DECANTER SPIKE VIAL GLASS SM (MISCELLANEOUS) ×2 IMPLANT
DERMABOND ADVANCED (GAUZE/BANDAGES/DRESSINGS) ×1
DERMABOND ADVANCED .7 DNX12 (GAUZE/BANDAGES/DRESSINGS) ×1 IMPLANT
DURAPREP 26ML APPLICATOR (WOUND CARE) ×2 IMPLANT
ELECT REM PT RETURN 9FT ADLT (ELECTROSURGICAL) ×2
ELECTRODE REM PT RTRN 9FT ADLT (ELECTROSURGICAL) ×1 IMPLANT
FORMALIN 10 PREFIL 120ML (MISCELLANEOUS) ×2 IMPLANT
GLOVE BIO SURGEON STRL SZ7.5 (GLOVE) ×2 IMPLANT
GLOVE BIOGEL PI IND STRL 7.0 (GLOVE) ×1 IMPLANT
GLOVE BIOGEL PI IND STRL 7.5 (GLOVE) ×1 IMPLANT
GLOVE BIOGEL PI INDICATOR 7.0 (GLOVE) ×1
GLOVE BIOGEL PI INDICATOR 7.5 (GLOVE) ×1
GLOVE ECLIPSE 7.0 STRL STRAW (GLOVE) ×4 IMPLANT
GLOVE EXAM NITRILE LRG STRL (GLOVE) ×2 IMPLANT
GOWN STRL REIN XL XLG (GOWN DISPOSABLE) ×6 IMPLANT
KIT ROOM TURNOVER APOR (KITS) ×2 IMPLANT
MANIFOLD NEPTUNE II (INSTRUMENTS) ×2 IMPLANT
NEEDLE HYPO 25X1 1.5 SAFETY (NEEDLE) ×2 IMPLANT
NS IRRIG 1000ML POUR BTL (IV SOLUTION) ×2 IMPLANT
PACK MINOR (CUSTOM PROCEDURE TRAY) ×2 IMPLANT
PAD ARMBOARD 7.5X6 YLW CONV (MISCELLANEOUS) ×2 IMPLANT
SET BASIN LINEN APH (SET/KITS/TRAYS/PACK) ×2 IMPLANT
SPONGE LAP 18X18 X RAY DECT (DISPOSABLE) ×2 IMPLANT
STOCKINETTE IMPERVIOUS LG (DRAPES) ×2 IMPLANT
SUT SILK 0 FSL (SUTURE) ×2 IMPLANT
SUT VIC AB 3-0 SH 27 (SUTURE) ×1
SUT VIC AB 3-0 SH 27X BRD (SUTURE) ×1 IMPLANT
SUT VIC AB 4-0 PS2 27 (SUTURE) ×4 IMPLANT
SYR CONTROL 10ML LL (SYRINGE) ×2 IMPLANT

## 2013-11-12 NOTE — Op Note (Signed)
Patient:  Sally [name]  DOB:  [birth year]  MRN:  [id number]   Preop Diagnosis:  Left breast carcinoma  Postop Diagnosis:  Same  Procedure:  Left partial mastectomy after needle localization, sentinel lymph node biopsy  Surgeon:  Franky Macho, M.D.  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 After extensive discussion with the patient, she was elected to proceed with a left partial mastectomy after needle localization with sentinel lymph node biopsy, possible axillary dissection. The risks and benefits of the procedures including bleeding, infection, nerve injury, and the possibility of positive surgical margins were fully explained to the patient, who gave informed consent.  Procedure note:  The patient is placed the supine position. The patient had already undergone needle localization and radioactive nuclide injection by the radiology department. After induction of general endotracheal anesthesia, the left breast cancer site was injected with blue dye. This was massaged into the breast for 5 minutes. Then the left breast and axilla were prepped and draped using usual sterile technique with DuraPrep. Surgical site confirmation was performed.  First, we sentinel lymph node biopsy left axilla was performed. Using the neoprobe, I dissection was taken down to the left axilla. A large blue lymph node was noted. Ex vivo counts were approximately 3000. The basin count was less than 30. The specimen was sent for frozen section and that was negative for malignancy. Any bleeding was controlled using small clips. 0.5% Sensorcaine was instilled the surrounding wound. The subcutaneous layer was reapproximated using 3-0 Vicryl interrupted suture. The skin was closed using a 4 Vicryl subcuticular suture. Dermabond was applied when both  procedures were finished.  A wire was in place along the left lateral aspect of the left breast. An incision was made to include the guidewire. The guidewire was then used to guide the surgical resection of the cancer. Upon removal of the breast tissue, the area that was concerning for cancer was close to the medial margin. A short suture was placed superiorly and a long suture placed laterally for orientation purposes. I then elected to excise further medially down to the chest wall the partial mastectomy site. This was sent in formalin. Specimen radiography revealed the suspicious area along with the clip to have been removed. It was then sent to pathology further examination. The wound was irrigated iwth normal saline. A bleeding was controlled using Bovie electrocautery. 0.5% Sensorcaine was instilled the surrounding wound. The skin was closed a 4 Vicryl subcuticular suture. Dermabond was then applied.  All tape and needle counts were correct the end of the procedure. The patient was extubated in the operating room and transferred to PACU in stable condition.  Complications:  None  EBL:  Minimal  Specimen:  Left axillary sentinel lymph node biopsy, left breast tissue with metallic marker in place, extending medial excision of mastectomy site

## 2013-11-12 NOTE — Anesthesia Preprocedure Evaluation (Signed)
Anesthesia Evaluation  Patient identified by MRN, date of birth, ID band Patient awake    Reviewed: Allergy & Precautions, H&P , NPO status , Patient's Chart, lab work & pertinent test results  Airway Mallampati: III TM Distance: >3 FB Neck ROM: Full    Dental  (+) Partial Lower and Partial Upper   Pulmonary Current Smoker,  breath sounds clear to auscultation        Cardiovascular hypertension, Pt. on medications Rhythm:Regular Rate:Normal     Neuro/Psych    GI/Hepatic negative GI ROS,   Endo/Other  diabetes, Well Controlled, Type 2  Renal/GU      Musculoskeletal   Abdominal   Peds  Hematology   Anesthesia Other Findings   Reproductive/Obstetrics                           Anesthesia Physical Anesthesia Plan  ASA: II  Anesthesia Plan: General   Post-op Pain Management:    Induction: Intravenous  Airway Management Planned: Oral ETT and Video Laryngoscope Planned  Additional Equipment:   Intra-op Plan:   Post-operative Plan: Extubation in OR  Informed Consent: I have reviewed the patients History and Physical, chart, labs and discussed the procedure including the risks, benefits and alternatives for the proposed anesthesia with the patient or authorized representative who has indicated his/her understanding and acceptance.     Plan Discussed with:   Anesthesia Plan Comments:         Anesthesia Quick Evaluation

## 2013-11-12 NOTE — OR Nursing (Signed)
Back from xray / mammo.  Via wheelchair.    Noted   A, red scratch area to left shoulder  Appr. 1/1/2 inches long. Appr. 1/4" wide.  Patient states  That she don't know ow that happened.  No bleeding or pain at site.

## 2013-11-12 NOTE — OR Nursing (Signed)
To xray for needle loc via wheelchair

## 2013-11-12 NOTE — Anesthesia Procedure Notes (Signed)
Procedure Name: Intubation Date/Time: 11/12/2013 9:55 AM Performed by: Despina Hidden Pre-anesthesia Checklist: Suction available, Emergency Drugs available, Patient being monitored and Patient identified Patient Re-evaluated:Patient Re-evaluated prior to inductionOxygen Delivery Method: Circle system utilized Preoxygenation: Pre-oxygenation with 100% oxygen Intubation Type: IV induction, Rapid sequence and Cricoid Pressure applied Ventilation: Mask ventilation without difficulty and Oral airway inserted - appropriate to patient size Grade View: Grade II Tube type: Oral Tube size: 7.0 mm Number of attempts: 1 Airway Equipment and Method: Video-laryngoscopy and Stylet Placement Confirmation: positive ETCO2,  ETT inserted through vocal cords under direct vision and breath sounds checked- equal and bilateral Secured at: 22 cm Tube secured with: Tape Dental Injury: Teeth and Oropharynx as per pre-operative assessment

## 2013-11-12 NOTE — Anesthesia Postprocedure Evaluation (Signed)
  Anesthesia Post-op Note  Patient: Sally [name]  Procedure(s) Performed: Procedure(s): PARTIAL MASTECTOMY WITH NEEDLE LOCALIZATION AND AXILLARY SENTINEL LYMPH NODE BX (Left)  Patient Location: PACU  Anesthesia Type:General  Level of Consciousness: awake, alert , oriented and patient cooperative  Airway and Oxygen Therapy: Patient Spontanous Breathing  Post-op Pain: 3 /10, mild  Post-op Assessment: Post-op Vital signs reviewed, Patient's Cardiovascular Status Stable, Respiratory Function Stable, Patent Airway, No signs of Nausea or vomiting and Pain level controlled  Post-op Vital Signs: Reviewed and stable  Complications: No apparent anesthesia complications

## 2013-11-12 NOTE — Transfer of Care (Signed)
Immediate Anesthesia Transfer of Care Note  Patient: Sally [name]  Procedure(s) Performed: Procedure(s): PARTIAL MASTECTOMY WITH NEEDLE LOCALIZATION AND AXILLARY SENTINEL LYMPH NODE BX (Left)  Patient Location: PACU  Anesthesia Type:General  Level of Consciousness: awake and patient cooperative  Airway & Oxygen Therapy: Patient Spontanous Breathing and Patient connected to face mask oxygen  Post-op Assessment: Report given to PACU RN, Post -op Vital signs reviewed and stable and Patient moving all extremities  Post vital signs: Reviewed and stable  Complications: No apparent anesthesia complications

## 2013-11-12 NOTE — Interval H&P Note (Signed)
History and Physical Interval Note:  11/12/2013 9:32 AM  Sally [name]  has presented today for surgery, with the diagnosis of left breast cancer  The various methods of treatment have been discussed with the patient and family. After consideration of risks, benefits and other options for treatment, the patient has consented to  Procedure(s) with comments: PARTIAL MASTECTOMY WITH NEEDLE LOCALIZATION AND AXILLARY SENTINEL LYMPH NODE BX (Left) - Sentinel Node Injection 7:30/Needle Loc 8:00 as a surgical intervention .  The patient's history has been reviewed, patient examined, no change in status, stable for surgery.  I have reviewed the patient's chart and labs.  Questions were answered to the patient's satisfaction.     Franky Macho A

## 2013-11-12 NOTE — OR Nursing (Signed)
Dr. Lovell Sheehan informed and looked at  scratch area to right shoulder .

## 2013-11-12 NOTE — OR Nursing (Signed)
Sentinel injection completed.

## 2013-11-12 NOTE — OR Nursing (Signed)
Nuc med called to in form that patient is here and ready for sentinel node injection.

## 2013-11-13 ENCOUNTER — Encounter (HOSPITAL_COMMUNITY): Payer: Self-pay | Admitting: General Surgery

## 2013-11-28 ENCOUNTER — Other Ambulatory Visit (HOSPITAL_COMMUNITY): Payer: Self-pay | Admitting: *Deleted

## 2013-12-04 ENCOUNTER — Encounter (HOSPITAL_COMMUNITY): Payer: Self-pay

## 2013-12-04 ENCOUNTER — Encounter (HOSPITAL_COMMUNITY): Payer: BC Managed Care – PPO | Attending: Hematology and Oncology

## 2013-12-04 ENCOUNTER — Encounter (HOSPITAL_COMMUNITY): Payer: BC Managed Care – PPO

## 2013-12-04 VITALS — BP 116/85 | HR 105 | Temp 97.9°F | Resp 16 | Ht 60.5 in | Wt 153.2 lb

## 2013-12-04 DIAGNOSIS — Z170 Estrogen receptor positive status [ER+]: Secondary | ICD-10-CM

## 2013-12-04 DIAGNOSIS — C50919 Malignant neoplasm of unspecified site of unspecified female breast: Secondary | ICD-10-CM

## 2013-12-04 DIAGNOSIS — C50912 Malignant neoplasm of unspecified site of left female breast: Secondary | ICD-10-CM

## 2013-12-04 LAB — COMPREHENSIVE METABOLIC PANEL
AST: 30 U/L (ref 0–37)
BUN: 17 mg/dL (ref 6–23)
CO2: 28 mEq/L (ref 19–32)
Calcium: 10.4 mg/dL (ref 8.4–10.5)
Chloride: 95 mEq/L — ABNORMAL LOW (ref 96–112)
Creatinine, Ser: 0.99 mg/dL (ref 0.50–1.10)
GFR calc Af Amer: 73 mL/min — ABNORMAL LOW (ref >90)
GFR calc non Af Amer: 63 mL/min — ABNORMAL LOW (ref >90)
Glucose, Bld: 102 mg/dL — ABNORMAL HIGH (ref 70–99)
Sodium: 136 mEq/L (ref 135–145)
Total Bilirubin: 0.7 mg/dL (ref 0.3–1.2)
Total Protein: 8.5 g/dL — ABNORMAL HIGH (ref 6.0–8.3)

## 2013-12-04 LAB — CBC WITH DIFFERENTIAL/PLATELET
Basophils Absolute: 0 10*3/uL (ref 0.0–0.1)
Eosinophils Absolute: 0.1 10*3/uL (ref 0.0–0.7)
Eosinophils Relative: 2 % (ref 0–5)
HCT: 37.9 % (ref 36.0–46.0)
Lymphocytes Relative: 27 % (ref 12–46)
Lymphs Abs: 1.4 10*3/uL (ref 0.7–4.0)
MCH: 31.1 pg (ref 26.0–34.0)
MCV: 91.3 fL (ref 78.0–100.0)
Monocytes Absolute: 0.4 10*3/uL (ref 0.1–1.0)
Monocytes Relative: 8 % (ref 3–12)
Platelets: 249 10*3/uL (ref 150–400)
RBC: 4.15 MIL/uL (ref 3.87–5.11)
RDW: 13.7 % (ref 11.5–15.5)
WBC: 5.2 10*3/uL (ref 4.0–10.5)

## 2013-12-04 NOTE — Progress Notes (Signed)
Sally [name] presented for labwork. Labs per MD order drawn via Peripheral Line 23 gauge needle inserted in right AC  Good blood return present. Procedure without incident.  Needle removed intact. Patient tolerated procedure well.

## 2013-12-04 NOTE — Patient Instructions (Signed)
[facility] Discharge Instructions  RECOMMENDATIONS MADE BY THE CONSULTANT AND ANY TEST RESULTS WILL BE SENT TO YOUR REFERRING PHYSICIAN.  EXAM FINDINGS BY THE PHYSICIAN TODAY AND SIGNS OR SYMPTOMS TO REPORT TO CLINIC OR PRIMARY PHYSICIAN: Exam and findings as discussed by Dr. Zigmund Daniel. Need to wait for results of oncotype to determine best treatment options for you.  We will call you with the date and time of your next appointment.  MEDICATIONS PRESCRIBED:  none  INSTRUCTIONS/FOLLOW-UP: To be arranged.  Thank you for choosing [facility] to provide your oncology and hematology care.  To afford each patient quality time with our providers, please arrive at least 15 minutes before your scheduled appointment time.  With your help, our goal is to use those 15 minutes to complete the necessary work-up to ensure our physicians have the information they need to help with your evaluation and healthcare recommendations.    Effective January 1st, 2014, we ask that you re-schedule your appointment with our physicians should you arrive 10 or more minutes late for your appointment.  We strive to give you quality time with our providers, and arriving late affects you and other patients whose appointments are after yours.    Again, thank you for choosing [facility].  Our hope is that these requests will decrease the amount of time that you wait before being seen by our physicians.       _____________________________________________________________  Should you have questions after your visit to [facility], please contact our office at [phone] between the hours of 8:30 a.m. and 5:00 p.m.  Voicemails left after 4:30 p.m. will not be returned until the following business day.  For prescription refill requests, have your pharmacy contact our office with your prescription refill request.

## 2013-12-04 NOTE — Progress Notes (Signed)
[facility] Earl Lites A. Zigmund Daniel, M.D.  NEW PATIENT EVALUATION   Name: Sally [name] Date: 12/04/2013 MRN: [id number] DOB: 20-Sep-1956  PCP: Avon Gully, MD   REFERRING PHYSICIAN: Avon Gully, MD  REASON FOR REFERRAL: Breast cancer newly diagnosed.     HISTORY OF PRESENT ILLNESS:Sally [name] is a 57 y.o. female who is referred by her surgeon and family physician for evaluation and recommendation regarding management of newly diagnosed left breast cancer. 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 She was subsequent referred to Dr. Lovell Sheehan and after careful consideration he elected to undergo lumpectomy and sentinel node biopsy which was performed on 11/12/2013 revealing a 1.9 cm tumor that was node-negative, ER 57%, PR negative, HER-2/neu non-amplified. Right breast also showed an abnormality on mammography which on biopsy showed no evidence of cancer. She has experienced swelling and discomfort in the left breast without left upper extremity swelling. She denies any nasal drip, sore throat, cough, wheezing, PND, orthopnea, palpitations, nausea, vomiting, diarrhea, constipation, melena, hematochezia, hematuria, vaginal bleeding, dysuria, hematuria, incontinence, lower extremity swelling or redness, skin rash, headache, or seizures. She is being seen by her surgeon who expressed some fluid from the left breast without inserting a needle. This is being followed closely.   PAST MEDICAL HISTORY:  has a past medical history of Chronic sinusitis; Hypertension; Diabetes mellitus without complication; and Cancer (11/12/2013).     PAST SURGICAL HISTORY: Past Surgical History  Procedure Laterality Date  . Ectopic pregnancy surgery    . Abdominal hysterectomy    . Left leg surgery      17 yrs of age  . Ganglion cyst excision    . Dilation and curettage of  uterus    . Colonoscopy  01/09/2012    Procedure: COLONOSCOPY;  Surgeon: Arlyce Harman, MD;  Location: [facility];  Service: Endoscopy;  Laterality: N/A;  1:30 pm  . Inguinal hernia repair Left   . Partial mastectomy with needle localization and axillary sentinel lymph node bx Left 11/12/2013    Procedure: PARTIAL MASTECTOMY WITH NEEDLE LOCALIZATION AND AXILLARY SENTINEL LYMPH NODE BX;  Surgeon: Dalia Heading, MD;  Location: [facility] ORS;  Service: General;  Laterality: Left;     CURRENT MEDICATIONS: has a current medication list which includes the following prescription(s): atorvastatin, hydrocodone-acetaminophen, loratadine, triamterene-hydrochlorothiazide, and ibuprofen.   ALLERGIES: Review of patient's allergies indicates no known allergies.   SOCIAL HISTORY:  reports that she has been smoking Cigarettes.  She has a 3.75 pack-year smoking history. She has never used smokeless tobacco. She reports that she drinks alcohol. She reports that she does not use illicit drugs.   FAMILY HISTORY: family history includes Diabetes type II in her father and mother; Prostate cancer in her father.    REVIEW OF SYSTEMS:  Other than that discussed above is noncontributory.    PHYSICAL EXAM:  height is 5' 0.5" (1.537 m) and weight is 153 lb 3.2 oz (69.491 kg). Her oral temperature is 97.9 F (36.6 C). Her blood pressure is 116/85 and her pulse is 105. Her respiration is 16.    GENERAL:alert, no distress and comfortable SKIN: skin color, texture, turgor are normal, no rashes or significant lesions EYES: normal, Conjunctiva are pink and non-injected, sclera clear OROPHARYNX:no exudate, no erythema and lips, buccal mucosa, and tongue normal  NECK: supple, thyroid normal size, non-tender, without nodularity CHEST: Status  post left breast lumpectomy with swelling and tenderness without axillary adenopathy. Right breast without mass but with biopsy site in the upper inner quadrant LYMPH:  no palpable  lymphadenopathy in the cervical, axillary or inguinal LUNGS: clear to auscultation and percussion with normal breathing effort HEART: regular rate & rhythm and no murmurs ABDOMEN:abdomen soft, non-tender and normal bowel sounds MUSCULOSKELETALl:no cyanosis of digits, no clubbing or edema  NEURO: alert & oriented x 3 with fluent speech, no focal motor/sensory deficits    LABORATORY DATA:  Hospital Outpatient Visit on 11/12/2013  Component Date Value Range Status  . Glucose-Capillary 11/12/2013 94  70 - 99 mg/dL Final  Hospital Outpatient Visit on 11/06/2013  Component Date Value Range Status  . WBC 11/06/2013 4.6  4.0 - 10.5 K/uL Final  . RBC 11/06/2013 4.43  3.87 - 5.11 MIL/uL Final  . Hemoglobin 11/06/2013 13.8  12.0 - 15.0 g/dL Final  . HCT 16/09/9603 40.2  36.0 - 46.0 % Final  . MCV 11/06/2013 90.7  78.0 - 100.0 fL Final  . MCH 11/06/2013 31.2  26.0 - 34.0 pg Final  . MCHC 11/06/2013 34.3  30.0 - 36.0 g/dL Final  . RDW 54/08/8118 12.8  11.5 - 15.5 % Final  . Platelets 11/06/2013 175  150 - 400 K/uL Final  . Neutrophils Relative % 11/06/2013 53  43 - 77 % Final  . Neutro Abs 11/06/2013 2.4  1.7 - 7.7 K/uL Final  . Lymphocytes Relative 11/06/2013 33  12 - 46 % Final  . Lymphs Abs 11/06/2013 1.5  0.7 - 4.0 K/uL Final  . Monocytes Relative 11/06/2013 11  3 - 12 % Final  . Monocytes Absolute 11/06/2013 0.5  0.1 - 1.0 K/uL Final  . Eosinophils Relative 11/06/2013 3  0 - 5 % Final  . Eosinophils Absolute 11/06/2013 0.2  0.0 - 0.7 K/uL Final  . Basophils Relative 11/06/2013 0  0 - 1 % Final  . Basophils Absolute 11/06/2013 0.0  0.0 - 0.1 K/uL Final  . Sodium 11/06/2013 141  135 - 145 mEq/L Final  . Potassium 11/06/2013 3.8  3.5 - 5.1 mEq/L Final  . Chloride 11/06/2013 101  96 - 112 mEq/L Final  . CO2 11/06/2013 32  19 - 32 mEq/L Final  . Glucose, Bld 11/06/2013 116* 70 - 99 mg/dL Final  . BUN 14/78/2956 19  6 - 23 mg/dL Final  . Creatinine, Ser 11/06/2013 1.08  0.50 - 1.10 mg/dL  Final  . Calcium 21/30/8657 10.2  8.4 - 10.5 mg/dL Final  . Total Protein 11/06/2013 7.7  6.0 - 8.3 g/dL Final  . Albumin 84/69/6295 4.2  3.5 - 5.2 g/dL Final  . AST 28/41/3244 32  0 - 37 U/L Final  . ALT 11/06/2013 33  0 - 35 U/L Final  . Alkaline Phosphatase 11/06/2013 158* 39 - 117 U/L Final  . Total Bilirubin 11/06/2013 0.4  0.3 - 1.2 mg/dL Final  . GFR calc non Af Amer 11/06/2013 57* >90 mL/min Final  . GFR calc Af Amer 11/06/2013 66* >90 mL/min Final   Comment: (NOTE)                          The eGFR has been calculated using the CKD EPI equation.                          This calculation has not been validated in all clinical situations.  eGFR's persistently <90 mL/min signify possible Chronic Kidney                          Disease.    Urinalysis No results found for this basename: colorurine,  appearanceur,  labspec,  phurine,  glucoseu,  hgbur,  bilirubinur,  ketonesur,  proteinur,  urobilinogen,  nitrite,  leukocytesur      @RADIOGRAPHY : Chest 2 View  11/06/2013   CLINICAL DATA:  Breast cancer.  Smoker.  EXAM: CHEST  2 VIEW  COMPARISON:  No recent exam available for comparison.  FINDINGS: Mediastinum is normal. Mild bilateral hilar fullness and infrahilar soft tissue fullness is present. Although this may be vascular, adenopathy cannot be excluded. Mild atelectasis versus infiltrates present in the lung bases. Changes suggesting mild bronchiectasis noted in the right lung base. Given the above findings and the patient's history of breast cancer and smoking, it may be wise to perform a chest CT to evaluate for adenopathy/mass lesion and/ or significant infiltrate in the lung bases. Cardiac silhouette unremarkable. Pulmonary vascularity is normal. Mild degenerative changes thoracic spine. No focal bony abnormalities identified. No pneumothorax.  IMPRESSION: 1. Bilateral hilar and infrahilar fullness. Although this may be vascular to exclude a mass lesion or  adenopathy, chest CT suggested. 2. Basilar atelectasis with possible infiltrates. Bronchiectasis particularly in the right lung base cannot be excluded. These findings can also be further evaluated with chest CT.   Electronically Signed   By: Maisie Fus  Register   On: 11/06/2013 11:35   Nm Sentinel Node Inj-no Rpt (breast)  11/12/2013   CLINICAL DATA: Left Breast cancer   Sulfur colloid was injected intradermally by the nuclear medicine  technologist for breast cancer sentinel node localization.    Mm Lt Plc Breast Loc Dev   1st Lesion  Inc Mammo Guide  11/12/2013   CLINICAL DATA:  Left breast cancer.  EXAM: NEEDLE LOCALIZATION OF THE LEFT BREAST WITH MAMMO GUIDANCE  COMPARISON:  Previous exams.  FINDINGS: Patient presents for needle localization prior to lumpectomy. I met with the patient and we discussed the procedure of needle localization including benefits and alternatives. We discussed the high likelihood of a successful procedure. We discussed the risks of the procedure, including infection, bleeding, tissue injury, and further surgery. Informed, written consent was given. The usual time-out protocol was performed immediately prior to the procedure.  Using mammographic guidance, sterile technique, 2% lidocaine and a 7 cm modified Kopans needle, the biopsy site marker was localized using lateral to medial approach. The films were marked for Dr. Lovell Sheehan.  Specimen radiograph was performed at the radiology department and confirms the hookwire and clip to be present in the tissue sample. The specimen was marked for pathology.  IMPRESSION: Needle localization of the left breast. No apparent complications.   Electronically Signed   By: Jerene Dilling M.D.   On: 11/12/2013 11:14    PATHOLOGY: 1902) Patient: VERYL, ABRIL Collected: 09/24/2013 Client: [facility] Accession: [id number] Received: 09/25/2013 Cain Saupe, MD DOB: 12/26/1955 Age: 50 Gender: F Reported: 09/26/2013 [address] Patient Ph: [phone] MRN #: [id number] [place], [place] [address] Visit #: [id number] Chart #: Phone: [phone] Fax: CC: Avon Gully, MD REPORT OF SURGICAL PATHOLOGY ADDITIONAL INFORMATION: PROGNOSTIC INDICATORS - ACIS Results: IMMUNOHISTOCHEMICAL AND MORPHOMETRIC ANALYSIS BY THE AUTOMATED CELLULAR IMAGING SYSTEM (ACIS) Estrogen Receptor: 57%, POSITIVE, WEAK STAINING INTENSITY Progesterone Receptor: 0% NEGATIVE Proliferation Marker Ki67: 62% COMMENT: The negative hormone receptor study in this  case have an internal positive control. REFERENCE RANGE ESTROGEN RECEPTOR NEGATIVE <1% POSITIVE =>1% PROGESTERONE RECEPTOR NEGATIVE <1% POSITIVE =>1% All controls stained appropriately Pecola Leisure MD Pathologist, Electronic Signature ( Signed 10/01/2013) CHROMOGENIC IN-SITU HYBRIDIZATION Results: HER-2/NEU BY CISH - NO AMPLIFICATION OF HER-2 DETECTED. RESULT RATIO OF HER2: CEP 17 SIGNALS 0.94 AVERAGE HER2 COPY NUMBER PER CELL 1.55 1 of 3 FINAL for FAIRY, ASHLOCK [phone]) ADDITIONAL INFORMATION:(continued) REFERENCE RANGE NEGATIVE HER2/Chr17 Ratio <2.0 and Average HER2 copy number <4.0 EQUIVOCAL HER2/Chr17 Ratio <2.0 and Average HER2 copy number 4.0 and <6.0 POSITIVE HER2/Chr17 Ratio >=2.0 and/or Average HER2 copy number >=6.0 Pecola Leisure MD Pathologist, Electronic Signature ( Signed 09/30/2013) FINAL DIAGNOSIS Diagnosis Breast, left, needle core biopsy, 2 o'clock, 5 cm/nipple - INVASIVE DUCTAL CARCINOMA. Microscopic Comment Although the grade and type is best determined at the time of excision, there is an invasive ductal carcinoma that appears to be intermediate grade. 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 There is no definitive evidence of angiolymphatic invasion identified. The longest length involved by tumor measures 0.7 cm from the glass slide. A Breast prognostic profile will be performed and an addendum report will  follow. (HCL:gt, 09/26/13) Abigail Miyamoto MD Pathologist, Electronic Signature (Case signed 09/26/2013) Specimen Gross and Clinical Information Specimen(s) Obtained: Breast, left, needle core biopsy, 2 o'clock, 5 cm/nipple Specimen Clinical Information chronic complicated cyst vs fibroadenoma vs invasive mammary carcinoma Gross Received in formalin and consists of four core and core fragments of tan brown, focal hemorrhagic fibroadipose tissue, ranging from 0.3 x 0.3 x 0.1 cm to 2.1 x 0.2 x 0.2 cm. The specimen is entirely submitted in one cassette. (KL:gt, 09/25/13) Stain(s) used in Diagnosis: The following stain(s) were used in diagnosing the case: ER-ACIS, PR-ACIS, CISH, KI-67-ACIS. The control(s) stained appropriately. Disclaimer PR progesterone receptor (PgR 636), immunohistochemical stains are performed on formalin fixed, paraffin embedded tissue using a 3,3"-diaminobenzidine (DAB) chromogen and DAKO Autostainer System. The staining intensity of the nucleus is scored morphometrically using the Automated Cellular Imaging System (ACIS) and is reported as the percentage of tumor cell nuclei demonstrating specific nuclear staining. Estrogen receptor (SP1), immunohistochemical stains are performed on formalin fixed, paraffin embedded tissue using a 3,3"-diaminobenzidine (DAB) chromogen and DAKO Autostainer System. The staining intensity of the nucleus is scored morphometrically using the Automated Cellular Imaging System (ACIS) and is reported as the percentage of tumor cell nuclei demonstrating specific nuclear staining. Ki-67 (Mib-1), immunohistochemical stains are performed on formalin fixed, paraffin embedded tissue using a 3,3"-diaminobenzidine (DAB) chromogen and DAKO Autostainer 2 of 3 FINAL for ADALIZ, DOBIS [phone]) Disclaimer(continued) System. The staining intensity of the nucleus is scored morphometrically using the Automated Cellular Imaging System (ACIS) and is  reported as the percentage of tumor cell nuclei demonstrating specific nuclear staining. Her2Neu by CISH (chromogenic in-situ hybridization) is performed at [facility] Pathology, using the Her2CISH pharmDx Kit (code number [phone]) Report signed out from the following location(s) Technical Component performed at [facility]. [address],[place] [address].CLIA:[id number],[id number]., Technical Component performed at [facility] [address] [place], [place], [place] [address]. CLIA #: [id number], Interpretation performed at [facility] [address], [place], [place] [address]. CLIA #: [id number],  for RAELIN, PIXLER ([id number]) Patient: Cheryll Dessert D Collected: 10/17/2013 Client: [facility] Accession: [id number] Received: 10/17/2013 Sherian Rein, MD DOB: 10-01-1956 Age: 51 Gender: F Reported: 10/20/2013 [address] Patient Ph: [phone] MRN #: [id number] [place], [place] [address] Client  Acc#: Chart #: [id number] Phone: [phone] Fax: CC: [facility] INTERNAL CC CC: Avon Gully, MD REPORT OF SURGICAL PATHOLOGY FINAL DIAGNOSIS Diagnosis Breast, right, needle core biopsy, medial - FIBROCYTIC CHANGES. - THERE IS NO EVIDENCE OF MALIGNANCY. - SEE COMMENT. Microscopic Comment The results were called to [facility] on 10/20/2013. (MM:ecj 10/20/2013) Pecola Leisure MD Pathologist, Electronic Signature (Case signed 10/20/2013) Specimen Gross and Clinical Information Specimen Comment Small nodule medial right breast. In formalin 8:15 a.m. Extracted < 1 min. Specimen(s) Obtained: Breast, right, needle core biopsy, medial Specimen Clinical Information R/O cancer Gross Received in formalin is a 2.5 x 2.5 x 0.4 cm aggregate of soft yellow tan fibrofatty tissue. The specimen is entirely submitted in one block. (TB:caf 10/17/13) Report signed out from the following location(s) Technical  Component performed at [facility]. [address],[place] [address].CLIA:[id number],CAP:[id number]., Interpretation performed at [facility] [address] [place], [place], [place] [address]. CLIA #: [id number],  FINAL for ANJELIQUE, MAKAR ([id number]) Patient: Cheryll Dessert D Collected: 11/12/2013 Client: [facility] Accession: [id number] Received: 11/12/2013 Franky Macho DOB: Feb 21, 1956 Age: 32 Gender: F Reported: 11/17/2013 [address] Patient Ph: [phone] MRN #: [id number] [place] [address] Visit #: [id number] Chart #: Phone: [phone] Fax: CC: REPORT OF SURGICAL PATHOLOGY ADDITIONAL INFORMATION: 2. CHROMOGENIC IN-SITU HYBRIDIZATION Results: HER-2/NEU BY CISH - NO AMPLIFICATION OF HER-2 DETECTED. RESULT RATIO OF HER2: CEP 17 SIGNALS 1.21 AVERAGE HER2 COPY NUMBER PER CELL 2.00 REFERENCE RANGE NEGATIVE HER2/Chr17 Ratio <2.0 and Average HER2 copy number <4.0 EQUIVOCAL HER2/Chr17 Ratio <2.0 and Average HER2 copy number 4.0 and <6.0 POSITIVE HER2/Chr17 Ratio >=2.0 and/or Average HER2 copy number >=6.0 Pecola Leisure MD Pathologist, Electronic Signature ( Signed 11/19/2013) FINAL DIAGNOSIS Diagnosis 1. Lymph node, sentinel, biopsy, left axillary - ONE BENIGN LYMPH NODE (0/1). 2. Breast, lumpectomy, left - INVASIVE DUCTAL CARCINOMA. - HIGH GRADE DUCTAL CARCINOMA IN SITU WITH NECROSIS. - INVASIVE CARCINOMA LESS THAN 0.1 CM FROM POSTERIOR AND MEDIAL MARGINS. 3. Breast, excision, left, medial border - FIBROCYSTIC CHANGES. - NO MALIGNANCY IDENTIFIED. - FINAL MEDIAL MARGIN CLEAR. 1 of 4 FINAL for MYLEI, BRACKEEN ([id number]) Microscopic Comment 2. BREAST, INVASIVE TUMOR, WITH LYMPH NODE SAMPLING Specimen, including laterality and lymph node sampling (sentinel, non-sentinel): Left breast and sentinel lymph node Procedure: Needle localized lumpectomy and sentinel lymph node with additional medial margin tissue Histologic type:  Invasive ductal carcinoma Grade: III Tubule formation: 3 Nuclear pleomorphism: 2 Mitotic: 3 Tumor size (gross measurement): 1.9 cm Margins: Free of tumor Invasive, distance to closest margin: Less than 0.1 cm from posterior and medial margin and lumpectomy, additional medial margin free of tumor. In-situ, distance to closest margin: Less than 0.1 cm from medial margin in lumpectomy, additional medial margin free of DCIS If margin positive, focally or broadly: N/A Lymphovascular invasion: Suspicious, see comment. Ductal carcinoma in situ: Present Grade: High grade Extensive intraductal component: No Lobular neoplasia: No Tumor focality: Unifocal Treatment effect: No If present, treatment effect in breast tissue, lymph nodes or both: N/A Extent of tumor: Skin: N/A Nipple: N/A Skeletal muscle: N/A Lymph nodes: Examined: 1 Sentinel 0 Non-sentinel 1 Total Lymph nodes with metastasis: 0 Isolated tumor cells (< 0.2 mm): 0 Micrometastasis: (> 0.2 mm and < 2.0 mm): 0 Macrometastasis: (> 2.0 mm): 0 Extracapsular extension: N/A Breast prognostic profile: Case [id number] Estrogen receptor: 57%, positive Progesterone receptor: 0%, negative Her 2 neu: No amplification, ratio is 0.94. Will be repeated on current specimen Ki-67: 62% Non-neoplastic breast: Fibrocystic  changes TNM: pT 1c, pN0, pMX Comments: The tumor is a 1.9 cm in greatest dimension invasive and in situ ductal carcinoma which is focally less than 0.1 cm from the posterior and medial margins in the lumpectomy (part 2). The additional medial margin tissue (part 3) is free of tumor and therefore the final medial margin is greater than 1 cm. Around some of the nests of invasive tumor cells there is retraction artifact and focally lymph vascular invasion cannot be ruled out. (JDP:kh 11/14/13) Jimmy Picket MD Pathologist, Electronic Signature (Case signed 11/17/2013) 2 of 4 FINAL for SEPTEMBER, MORMILE  [phone]) Intraoperative Diagnosis RAPID INTRAOPERATIVE CONSULT: RECEIVED FRESH IS TISSUE THAT CONTAINS A LYMPH NODE, WHICH IS SAMPLED FOR RIOC. LEFT AXILLARY SENTINEL LYMPH NODE, TOUCH PREPARATION - NO MALIGNANT CELLS IDENTIFIED. (JDP) Specimen Gross and Clinical Information Specimen(s) Obtained: 1. Lymph node, sentinel, biopsy, left axillary 2. Breast, lumpectomy, left 3. Breast, excision, left, medial border Specimen Clinical Information 2. left breast cancer 3. left breast cancer Gross 1. Rapid Intraoperative Consult performed (Yes or No): Yes. Specimen: Left axillary sentinel node. Number and size: One node, 1.5 cm. Cut Surface(s): Soft, fatty, focally blue. Block Summary: Bisected, touch preparations are made on one slide, and submitted in one block labeled SLN for routine histology. 2. Specimen type: Left breast lumpectomy. Size: 5.3 x 5.1 cm, and ranges from 1.4 to 1.8 cm thick. Orientation: Long suture at lateral, short suture at superior. The specimen is inked as follows: Green anterior, blue inferior, orange lateral, yellow medial, black posterior, red superior. Localized area: There is an inserted wire, but no localizing needle. Cut surface: There is a 1.9 x 1.6 x 1.5 cm gray-white to red, firm, ill-defined mass with an embedded ribbon clip. Margins: The mass abuts the medial and anterior margins, and comes within 0.2 cm of the superior and posterior margins. Inferior and lateral margins are greater than 1 cm. Prognostic indicators: Not taken at the time of gross. Block summary: A - C = mass and nearest anterior and posterior margins. D, E = mass and nearest medial margin. F, G = mass and nearest superior margin. H - inferior margin nearest mass. I = lateral margin nearest mass. Total, nine blocks. 3. Received in formalin is a 4 x 3.1 x 1.1 cm irregular unoriented portion of fibrofatty tissue, clinically medial border of left breast. The presumed new surgical  margin is inked black. On sectioning, there is predominantly soft fatty tissue and minimal gray-white soft fibrous tissue. A discrete lesion or mass is not identified. Entirely submitted in eight blocks. (SSW:ecj 11/12/2013) Stain(s) used in Diagnosis: The following stain(s) were used in diagnosing the case: P63, CISH, Smooth Muscle Myosin - 1 Heavy Chain, Calponin. The control(s) stained appropriately. 3 of 4 FINAL for CECILA, SATCHER ([id number]) Disclaimer Her2Neu by CISH (chromogenic in-situ hybridization) is performed at [facility] Pathology, using the Her2CISH pharmDx Kit (code number [phone]) Some of these immunohistochemical stains may have been developed and the performance characteristics determined by [facility]. Some may not have been cleared or approved by the U.S. Food and Drug Administration. The FDA has determined that such clearance or approval is not necessary. This test is used for clinical purposes. It should not be regarded as investigational or for research. This laboratory is certified under the Clinical Laboratory Improvement Amendments of 1988 (CLIA-88) as qualified to perform high complexity clinical laboratory testing. Report signed out from the following location(s) Technical Component performed at [facility] [address],[place],  [place] [address] CLIA: [id number]., Technical Component performed at [facility] [address] [place], [place], [place] [address]. CLIA #: [id number], Interpretation performed at [facility] [address] [place], [place], [place] [address]. CLIA #: [id number],   IMPRESSION:  #1. 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 Oncotype DX will be available on 12/10/2013 #2. Hyperlipidemia, on treatment. #3. Allergic rhinitis, on treatment. #4.  Hypertension, controlled.   PLAN:  #1. A discussion was held with the regarding the value of adjuvant therapy and which adjuvant treatment to recommend. If the patient's Oncotype DX recurrence score is low, endocrine treatment alone will be recommended along with external beam radiotherapy to the remainder of the left breast. Treatment with aromatase inhibitor will be instituted if biochemically she is postmenopausal. If chemotherapy is necessary, we'll treat with 4 cycles of Cytoxan and docetaxel followed by radiotherapy plus an aromatase inhibitor with the latter given for 5 years.  #2. Followup in one week when Oncotype DX score will be back. The patient was told to call the day before to ensure that the result is in hand.  I [id number] the opportunity of sharing in her care.   Maurilio Lovely, MD 12/04/2013 4:07 PM

## 2013-12-04 NOTE — Addendum Note (Signed)
Addended by: Evelena Leyden on: 12/04/2013 04:18 PM   Modules accepted: Orders

## 2013-12-05 LAB — CANCER ANTIGEN 27.29: CA 27.29: 64 U/mL — ABNORMAL HIGH (ref 0–39)

## 2013-12-05 LAB — ESTRADIOL: Estradiol: 13.5 pg/mL

## 2013-12-05 LAB — CEA: CEA: 1.6 ng/mL (ref 0.0–5.0)

## 2013-12-10 ENCOUNTER — Encounter (HOSPITAL_COMMUNITY): Payer: Self-pay

## 2013-12-11 ENCOUNTER — Encounter (HOSPITAL_BASED_OUTPATIENT_CLINIC_OR_DEPARTMENT_OTHER): Payer: BC Managed Care – PPO

## 2013-12-11 ENCOUNTER — Encounter (HOSPITAL_COMMUNITY): Payer: Self-pay

## 2013-12-11 VITALS — BP 133/83 | HR 91 | Temp 97.8°F | Resp 16 | Wt 154.4 lb

## 2013-12-11 DIAGNOSIS — C50919 Malignant neoplasm of unspecified site of unspecified female breast: Secondary | ICD-10-CM

## 2013-12-11 DIAGNOSIS — C50912 Malignant neoplasm of unspecified site of left female breast: Secondary | ICD-10-CM

## 2013-12-11 DIAGNOSIS — Z171 Estrogen receptor negative status [ER-]: Secondary | ICD-10-CM

## 2013-12-11 HISTORY — DX: Malignant neoplasm of unspecified site of left female breast: C50.912

## 2013-12-11 NOTE — Patient Instructions (Signed)
[facility] Discharge Instructions  RECOMMENDATIONS MADE BY THE CONSULTANT AND ANY TEST RESULTS WILL BE SENT TO YOUR REFERRING PHYSICIAN.  EXAM FINDINGS BY THE PHYSICIAN TODAY AND SIGNS OR SYMPTOMS TO REPORT TO CLINIC OR PRIMARY PHYSICIAN:    MUGA scan:   MUGA scans are used to evaluate your heart muscle or more specifically the Left Ventricle's pumping effectiveness. We will monitor this throughout chemotherapy to make sure that your heart muscle is not weakening.   Your chemo regimen will consist of Taxotere, Adriamycin, & Cytoxan every 3 weeks. You will receive Neulasta (an injection in your abdominal tissue) the day after chemo. Neulasta helps to boost your white blood cell count in your body.   Return to [facility] for chemo on January 7 and to see Dr. Zigmund Daniel on Jan 7.   Thank you for choosing [facility] to provide your oncology and hematology care.  To afford each patient quality time with our providers, please arrive at least 15 minutes before your scheduled appointment time.  With your help, our goal is to use those 15 minutes to complete the necessary work-up to ensure our physicians have the information they need to help with your evaluation and healthcare recommendations.    Effective January 1st, 2014, we ask that you re-schedule your appointment with our physicians should you arrive 10 or more minutes late for your appointment.  We strive to give you quality time with our providers, and arriving late affects you and other patients whose appointments are after yours.    Again, thank you for choosing [facility].  Our hope is that these requests will decrease the amount of time that you wait before being seen by our physicians.       _____________________________________________________________  Should you have questions after your visit to [facility], please contact our office at [phone] between the hours  of 8:30 a.m. and 5:00 p.m.  Voicemails left after 4:30 p.m. will not be returned until the following business day.  For prescription refill requests, have your pharmacy contact our office with your prescription refill request.    Docetaxel injection What is this medicine? DOCETAXEL (doe se TAX el) is a chemotherapy drug. It targets fast dividing cells, like cancer cells, and causes these cells to die. This medicine is used to treat many types of cancers like breast cancer, certain stomach cancers, head and neck cancer, lung cancer, and prostate cancer. This medicine may be used for other purposes; ask your health care provider or pharmacist if you have questions. COMMON BRAND NAME(S): Docefrez , Taxotere What should I tell my health care provider before I take this medicine? They need to know if you have any of these conditions: -infection (especially a virus infection such as chickenpox, cold sores, or herpes) -liver disease -low blood counts, like low white cell, platelet, or red cell counts -an unusual or allergic reaction to docetaxel, polysorbate 80, other chemotherapy agents, other medicines, foods, dyes, or preservatives -pregnant or trying to get pregnant -breast-feeding How should I use this medicine? This drug is given as an infusion into a vein. It is administered in a hospital or clinic by a specially trained health care professional. Talk to your pediatrician regarding the use of this medicine in children. Special care may be needed. Overdosage: If you think you have taken too much of this medicine contact a poison control center or emergency room at once. NOTE: This medicine is only  for you. Do not share this medicine with others. What if I miss a dose? It is important not to miss your dose. Call your doctor or health care professional if you are unable to keep an appointment. What may interact with this medicine? -cyclosporine -erythromycin -ketoconazole -medicines to  increase blood counts like filgrastim, pegfilgrastim, sargramostim -vaccines Talk to your doctor or health care professional before taking any of these medicines: -acetaminophen -aspirin -ibuprofen -ketoprofen -naproxen This list may not describe all possible interactions. Give your health care provider a list of all the medicines, herbs, non-prescription drugs, or dietary supplements you use. Also tell them if you smoke, drink alcohol, or use illegal drugs. Some items may interact with your medicine. What should I watch for while using this medicine? Your condition will be monitored carefully while you are receiving this medicine. You will need important blood work done while you are taking this medicine. This drug may make you feel generally unwell. This is not uncommon, as chemotherapy can affect healthy cells as well as cancer cells. Report any side effects. Continue your course of treatment even though you feel ill unless your doctor tells you to stop. In some cases, you may be given additional medicines to help with side effects. Follow all directions for their use. Call your doctor or health care professional for advice if you get a fever, chills or sore throat, or other symptoms of a cold or flu. Do not treat yourself. This drug decreases your body's ability to fight infections. Try to avoid being around people who are sick. This medicine may increase your risk to bruise or bleed. Call your doctor or health care professional if you notice any unusual bleeding. Be careful brushing and flossing your teeth or using a toothpick because you may get an infection or bleed more easily. If you have any dental work done, tell your dentist you are receiving this medicine. Avoid taking products that contain aspirin, acetaminophen, ibuprofen, naproxen, or ketoprofen unless instructed by your doctor. These medicines may hide a fever. Do not become pregnant while taking this medicine. Women should inform  their doctor if they wish to become pregnant or think they might be pregnant. There is a potential for serious side effects to an unborn child. Talk to your health care professional or pharmacist for more information. Do not breast-feed an infant while taking this medicine. What side effects may I notice from receiving this medicine? Side effects that you should report to your doctor or health care professional as soon as possible: -allergic reactions like skin rash, itching or hives, swelling of the face, lips, or tongue -low blood counts - This drug may decrease the number of white blood cells, red blood cells and platelets. You may be at increased risk for infections and bleeding. -signs of infection - fever or chills, cough, sore throat, pain or difficulty passing urine -signs of decreased platelets or bleeding - bruising, pinpoint red spots on the skin, black, tarry stools, nosebleeds -signs of decreased red blood cells - unusually weak or tired, fainting spells, lightheadedness -breathing problems -fast or irregular heartbeat -low blood pressure -mouth sores -nausea and vomiting -pain, swelling, redness or irritation at the injection site -pain, tingling, numbness in the hands or feet -swelling of the ankle, feet, hands -weight gain Side effects that usually do not require medical attention (report to your prescriber or health care professional if they continue or are bothersome): -bone pain -complete hair loss including hair on your head, underarms, pubic  hair, eyebrows, and eyelashes -diarrhea -excessive tearing -changes in the color of fingernails -loosening of the fingernails -nausea -muscle pain -red flush to skin -sweating -weak or tired This list may not describe all possible side effects. Call your doctor for medical advice about side effects. You may report side effects to FDA at 1-800-FDA-1088. Where should I keep my medicine? This drug is given in a hospital or clinic  and will not be stored at home. NOTE: This sheet is a summary. It may not cover all possible information. If you have questions about this medicine, talk to your doctor, pharmacist, or health care provider.  2014, Elsevier/[employer] Standard. (2008-11-23 11:52:10) Doxorubicin injection What is this medicine? DOXORUBICIN (dox oh ROO bi sin) is a chemotherapy drug. It is used to treat many kinds of cancer like Hodgkin's disease, leukemia, non-Hodgkin's lymphoma, neuroblastoma, sarcoma, and Wilms' tumor. It is also used to treat bladder cancer, breast cancer, lung cancer, ovarian cancer, stomach cancer, and thyroid cancer. This medicine may be used for other purposes; ask your health care provider or pharmacist if you have questions. COMMON BRAND NAME(S): Adriamycin PFS, Adriamycin RDF, Adriamycin, Rubex What should I tell my health care provider before I take this medicine? They need to know if you have any of these conditions: -blood disorders -heart disease, recent heart attack -infection (especially a virus infection such as chickenpox, cold sores, or herpes) -irregular heartbeat -liver disease -recent or ongoing radiation therapy -an unusual or allergic reaction to doxorubicin, other chemotherapy agents, other medicines, foods, dyes, or preservatives -pregnant or trying to get pregnant -breast-feeding How should I use this medicine? This drug is given as an infusion into a vein. It is administered in a hospital or clinic by a specially trained health care professional. If you have pain, swelling, burning or any unusual feeling around the site of your injection, tell your health care professional right away. Talk to your pediatrician regarding the use of this medicine in children. Special care may be needed. Overdosage: If you think you have taken too much of this medicine contact a poison control center or emergency room at once. NOTE: This medicine is only for you. Do not share this medicine  with others. What if I miss a dose? It is important not to miss your dose. Call your doctor or health care professional if you are unable to keep an appointment. What may interact with this medicine? Do not take this medicine with any of the following medications: -cisapride -droperidol -halofantrine -pimozide -zidovudine This medicine may also interact with the following medications: -chloroquine -chlorpromazine -clarithromycin -cyclophosphamide -cyclosporine -erythromycin -medicines for depression, anxiety, or psychotic disturbances -medicines for irregular heart beat like amiodarone, bepridil, dofetilide, encainide, flecainide, propafenone, quinidine -medicines for seizures like ethotoin, fosphenytoin, phenytoin -medicines for nausea, vomiting like dolasetron, ondansetron, palonosetron -medicines to increase blood counts like filgrastim, pegfilgrastim, sargramostim -methadone -methotrexate -pentamidine -progesterone -vaccines -verapamil Talk to your doctor or health care professional before taking any of these medicines: -acetaminophen -aspirin -ibuprofen -ketoprofen -naproxen This list may not describe all possible interactions. Give your health care provider a list of all the medicines, herbs, non-prescription drugs, or dietary supplements you use. Also tell them if you smoke, drink alcohol, or use illegal drugs. Some items may interact with your medicine. What should I watch for while using this medicine? Your condition will be monitored carefully while you are receiving this medicine. You will need important blood work done while you are taking this medicine. This drug may make you feel  generally unwell. This is not uncommon, as chemotherapy can affect healthy cells as well as cancer cells. Report any side effects. Continue your course of treatment even though you feel ill unless your doctor tells you to stop. Your urine may turn red for a few days after your dose. This  is not blood. If your urine is dark or brown, call your doctor. In some cases, you may be given additional medicines to help with side effects. Follow all directions for their use. Call your doctor or health care professional for advice if you get a fever, chills or sore throat, or other symptoms of a cold or flu. Do not treat yourself. This drug decreases your body's ability to fight infections. Try to avoid being around people who are sick. This medicine may increase your risk to bruise or bleed. Call your doctor or health care professional if you notice any unusual bleeding. Be careful brushing and flossing your teeth or using a toothpick because you may get an infection or bleed more easily. If you have any dental work done, tell your dentist you are receiving this medicine. Avoid taking products that contain aspirin, acetaminophen, ibuprofen, naproxen, or ketoprofen unless instructed by your doctor. These medicines may hide a fever. Men and women of childbearing age should use effective birth control methods while using taking this medicine. Do not become pregnant while taking this medicine. There is a potential for serious side effects to an unborn child. Talk to your health care professional or pharmacist for more information. Do not breast-feed an infant while taking this medicine. Do not let others touch your urine or other body fluids for 5 days after each treatment with this medicine. Caregivers should wear latex gloves to avoid touching body fluids during this time. There is a maximum amount of this medicine you should receive throughout your life. The amount depends on the medical condition being treated and your overall health. Your doctor will watch how much of this medicine you receive in your lifetime. Tell your doctor if you have taken this medicine before. What side effects may I notice from receiving this medicine? Side effects that you should report to your doctor or health care  professional as soon as possible: -allergic reactions like skin rash, itching or hives, swelling of the face, lips, or tongue -low blood counts - this medicine may decrease the number of white blood cells, red blood cells and platelets. You may be at increased risk for infections and bleeding. -signs of infection - fever or chills, cough, sore throat, pain or difficulty passing urine -signs of decreased platelets or bleeding - bruising, pinpoint red spots on the skin, black, tarry stools, blood in the urine -signs of decreased red blood cells - unusually weak or tired, fainting spells, lightheadedness -breathing problems -chest pain -fast, irregular heartbeat -mouth sores -nausea, vomiting -pain, swelling, redness at site where injected -pain, tingling, numbness in the hands or feet -swelling of ankles, feet, or hands -unusual bleeding or bruising Side effects that usually do not require medical attention (report to your doctor or health care professional if they continue or are bothersome): -diarrhea -facial flushing -hair loss -loss of appetite -missed menstrual periods -nail discoloration or damage -red or watery eyes -red colored urine -stomach upset This list may not describe all possible side effects. Call your doctor for medical advice about side effects. You may report side effects to FDA at 1-800-FDA-1088. Where should I keep my medicine? This drug is given in a hospital  or clinic and will not be stored at home. NOTE: This sheet is a summary. It may not cover all possible information. If you have questions about this medicine, talk to your doctor, pharmacist, or health care provider.  2014, [employer] Standard. (2013-04-08 09:54:34) Cyclophosphamide injection What is this medicine? CYCLOPHOSPHAMIDE (sye kloe FOSS fa mide) is a chemotherapy drug. It slows the growth of cancer cells. This medicine is used to treat many types of cancer like lymphoma, myeloma, leukemia,  breast cancer, and ovarian cancer, to name a few. This medicine may be used for other purposes; ask your health care provider or pharmacist if you have questions. COMMON BRAND NAME(S): Cytoxan, Neosar What should I tell my health care provider before I take this medicine? They need to know if you have any of these conditions: -blood disorders -history of other chemotherapy -infection -kidney disease -liver disease -recent or ongoing radiation therapy -tumors in the bone marrow -an unusual or allergic reaction to cyclophosphamide, other chemotherapy, other medicines, foods, dyes, or preservatives -pregnant or trying to get pregnant -breast-feeding How should I use this medicine? This drug is usually given as an injection into a vein or muscle or by infusion into a vein. It is administered in a hospital or clinic by a specially trained health care professional. Talk to your pediatrician regarding the use of this medicine in children. Special care may be needed. Overdosage: If you think you have taken too much of this medicine contact a poison control center or emergency room at once. NOTE: This medicine is only for you. Do not share this medicine with others. What if I miss a dose? It is important not to miss your dose. Call your doctor or health care professional if you are unable to keep an appointment. What may interact with this medicine? This medicine may interact with the following medications: -amiodarone -amphotericin B -azathioprine -certain antiviral medicines for HIV or AIDS such as protease inhibitors (e.g., indinavir, ritonavir) and zidovudine -certain blood pressure medications such as benazepril, captopril, enalapril, fosinopril, lisinopril, moexipril, monopril, perindopril, quinapril, ramipril, trandolapril -certain cancer medications such as anthracyclines (e.g., daunorubicin, doxorubicin), busulfan, cytarabine, paclitaxel, pentostatin, tamoxifen, trastuzumab -certain  diuretics such as chlorothiazide, chlorthalidone, hydrochlorothiazide, indapamide, metolazone -certain medicines that treat or prevent blood clots like warfarin -certain muscle relaxants such as succinylcholine -cyclosporine -etanercept -indomethacin -medicines to increase blood counts like filgrastim, pegfilgrastim, sargramostim -medicines used as general anesthesia -metronidazole -natalizumab This list may not describe all possible interactions. Give your health care provider a list of all the medicines, herbs, non-prescription drugs, or dietary supplements you use. Also tell them if you smoke, drink alcohol, or use illegal drugs. Some items may interact with your medicine. What should I watch for while using this medicine? Visit your doctor for checks on your progress. This drug may make you feel generally unwell. This is not uncommon, as chemotherapy can affect healthy cells as well as cancer cells. Report any side effects. Continue your course of treatment even though you feel ill unless your doctor tells you to stop. Drink water or other fluids as directed. Urinate often, even at night. In some cases, you may be given additional medicines to help with side effects. Follow all directions for their use. Call your doctor or health care professional for advice if you get a fever, chills or sore throat, or other symptoms of a cold or flu. Do not treat yourself. This drug decreases your body's ability to fight infections. Try to avoid being around people who  are sick. This medicine may increase your risk to bruise or bleed. Call your doctor or health care professional if you notice any unusual bleeding. Be careful brushing and flossing your teeth or using a toothpick because you may get an infection or bleed more easily. If you have any dental work done, tell your dentist you are receiving this medicine. You may get drowsy or dizzy. Do not drive, use machinery, or do anything that needs mental  alertness until you know how this medicine affects you. Do not become pregnant while taking this medicine or for 1 year after stopping it. Women should inform their doctor if they wish to become pregnant or think they might be pregnant. Men should not father a child while taking this medicine and for 4 months after stopping it. There is a potential for serious side effects to an unborn child. Talk to your health care professional or pharmacist for more information. Do not breast-feed an infant while taking this medicine. This medicine may interfere with the ability to have a child. This medicine has caused ovarian failure in some women. This medicine has caused reduced sperm counts in some men. You should talk with your doctor or health care professional if you are concerned about your fertility. If you are going to have surgery, tell your doctor or health care professional that you have taken this medicine. What side effects may I notice from receiving this medicine? Side effects that you should report to your doctor or health care professional as soon as possible: -allergic reactions like skin rash, itching or hives, swelling of the face, lips, or tongue -low blood counts - this medicine may decrease the number of white blood cells, red blood cells and platelets. You may be at increased risk for infections and bleeding. -signs of infection - fever or chills, cough, sore throat, pain or difficulty passing urine -signs of decreased platelets or bleeding - bruising, pinpoint red spots on the skin, black, tarry stools, blood in the urine -signs of decreased red blood cells - unusually weak or tired, fainting spells, lightheadedness -breathing problems -dark urine -dizziness -palpitations -swelling of the ankles, feet, hands -trouble passing urine or change in the amount of urine -weight gain -yellowing of the eyes or skin Side effects that usually do not require medical attention (report to your  doctor or health care professional if they continue or are bothersome): -changes in nail or skin color -hair loss -missed menstrual periods -mouth sores -nausea, vomiting This list may not describe all possible side effects. Call your doctor for medical advice about side effects. You may report side effects to FDA at 1-800-FDA-1088. Where should I keep my medicine? This drug is given in a hospital or clinic and will not be stored at home. NOTE: This sheet is a summary. It may not cover all possible information. If you have questions about this medicine, talk to your doctor, pharmacist, or health care provider.  2014, [employer] Standard. (2012-10-25 16:22:58) Pegfilgrastim injection What is this medicine? PEGFILGRASTIM (peg fil GRA stim) helps the body make more white blood cells. It is used to prevent infection in people with low amounts of white blood cells following cancer treatment. This medicine may be used for other purposes; ask your health care provider or pharmacist if you have questions. COMMON BRAND NAME(S): Neulasta What should I tell my health care provider before I take this medicine? They need to know if you have any of these conditions: -sickle cell disease -an unusual or  allergic reaction to pegfilgrastim, filgrastim, E.coli protein, other medicines, foods, dyes, or preservatives -pregnant or trying to get pregnant -breast-feeding How should I use this medicine? This medicine is for injection under the skin. It is usually given by a health care professional in a hospital or clinic setting. If you get this medicine at home, you will be taught how to prepare and give this medicine. Do not shake this medicine. Use exactly as directed. Take your medicine at regular intervals. Do not take your medicine more often than directed. It is important that you put your used needles and syringes in a special sharps container. Do not put them in a trash can. If you do not have a sharps  container, call your pharmacist or healthcare provider to get one. Talk to your pediatrician regarding the use of this medicine in children. While this drug may be prescribed for children who weigh more than 45 kg for selected conditions, precautions do apply Overdosage: If you think you have taken too much of this medicine contact a poison control center or emergency room at once. NOTE: This medicine is only for you. Do not share this medicine with others. What if I miss a dose? If you miss a dose, take it as soon as you can. If it is almost time for your next dose, take only that dose. Do not take double or extra doses. What may interact with this medicine? -lithium -medicines for growth therapy This list may not describe all possible interactions. Give your health care provider a list of all the medicines, herbs, non-prescription drugs, or dietary supplements you use. Also tell them if you smoke, drink alcohol, or use illegal drugs. Some items may interact with your medicine. What should I watch for while using this medicine? Visit your doctor for regular check ups. You will need important blood work done while you are taking this medicine. What side effects may I notice from receiving this medicine? Side effects that you should report to your doctor or health care professional as soon as possible: -allergic reactions like skin rash, itching or hives, swelling of the face, lips, or tongue -breathing problems -fever -pain, redness, or swelling where injected -shoulder pain -stomach or side pain Side effects that usually do not require medical attention (report to your doctor or health care professional if they continue or are bothersome): -aches, pains -headache -loss of appetite -nausea, vomiting -unusually tired This list may not describe all possible side effects. Call your doctor for medical advice about side effects. You may report side effects to FDA at 1-800-FDA-1088. Where should  I keep my medicine? Keep out of the reach of children. Store in a refrigerator between 2 and 8 degrees C (36 and 46 degrees F). Do not freeze. Keep in carton to protect from light. Throw away this medicine if it is left out of the refrigerator for more than 48 hours. Throw away any unused medicine after the expiration date. NOTE: This sheet is a summary. It may not cover all possible information. If you have questions about this medicine, talk to your doctor, pharmacist, or health care provider.  2014, Elsevier/Gold Standard. (2008-07-13 15:41:44) Implanted Port Instructions An implanted port is a central line that has a round shape and is placed under the skin. It is used for long-term IV (intravenous) access for:  Medicine.  Fluids.  Liquid nutrition, such as TPN (total parenteral nutrition).  Blood samples. Ports can be placed:  In the chest area just below the  collarbone (this is the most common place.)  In the arms.  In the belly (abdomen) area.  In the legs. PARTS OF THE PORT A port has 2 main parts:  The reservoir. The reservoir is round, disc-shaped, and will be a small, raised area under your skin.  The reservoir is the part where a needle is inserted (accessed) to either give medicines or to draw blood.  The catheter. The catheter is a long, slender tube that extends from the reservoir. The catheter is placed into a large vein.  Medicine that is inserted into the reservoir goes into the catheter and then into the vein. INSERTION OF THE PORT  The port is surgically placed in either an operating room or in a procedural area (interventional radiology).  Medicine may be given to help you relax during the procedure.  The skin where the port will be inserted is numbed (local anesthetic).  1 or 2 small cuts (incisions) will be made in the skin to insert the port.  The port can be used after it has been inserted. INCISION SITE CARE  The incision site may have small  adhesive strips on it. This helps keep the incision site closed. Sometimes, no adhesive strips are placed. Instead of adhesive strips, a special kind of surgical glue is used to keep the incision closed.  If adhesive strips were placed on the incision sites, do not take them off. They will fall off on their own.  The incision site may be sore for 1 to 2 days. Pain medicine can help.  Do not get the incision site wet. Bathe or shower as directed by your caregiver.  The incision site should heal in 5 to 7 days. A small scar may form after the incision has healed. ACCESSING THE PORT Special steps must be taken to access the port:  Before the port is accessed, a numbing cream can be placed on the skin. This helps numb the skin over the port site.  A sterile technique is used to access the port.  The port is accessed with a needle. Only "non-coring" port needles should be used to access the port. Once the port is accessed, a blood return should be checked. This helps ensure the port is in the vein and is not clogged (clotted).  If your caregiver believes your port should remain accessed, a clear (transparent) bandage will be placed over the needle site. The bandage and needle will need to be changed every week or as directed by your caregiver.  Keep the bandage covering the needle clean and dry. Do not get it wet. Follow your caregiver's instructions on how to take a shower or bath when the port is accessed.  If your port does not need to stay accessed, no bandage is needed over the port. FLUSHING THE PORT Flushing the port keeps it from getting clogged. How often the port is flushed depends on:  If a constant infusion is running. If a constant infusion is running, the port may not need to be flushed.  If intermittent medicines are given.  If the port is not being used. For intermittent medicines:  The port will need to be flushed:  After medicines have been given.  After blood has  been drawn.  As part of routine maintenance.  A port is normally flushed with:  Normal saline.  Heparin.  Follow your caregiver's advice on how often, how much, and the type of flush to use on your port. IMPORTANT PORT INFORMATION  Tell your caregiver if you are allergic to heparin.  After your port is placed, you will get a manufacturer's information card. The card has information about your port. Keep this card with you at all times.  There are many types of ports available. Know what kind of port you have.  In case of an emergency, it may be helpful to wear a medical alert bracelet. This can help alert health care workers that you have a port.  The port can stay in for as long as your caregiver believes it is necessary.  When it is time for the port to come out, surgery will be done to remove it. The surgery will be similar to how the port was put in.  If you are in the hospital or clinic:  Your port will be taken care of and flushed by a nurse.  If you are at home:  A home health care nurse may give medicines and take care of the port.  You or a family member can get special training and directions for giving medicine and taking care of the port at home. SEEK IMMEDIATE MEDICAL CARE IF:   Your port does not flush or you are unable to get a blood return.  New drainage or pus is coming from the incision.  A bad smell is coming from the incision site.  You develop swelling or increased redness at the incision site.  You develop increased swelling or pain at the port site.  You develop swelling or pain in the surrounding skin near the port.  You have an oral temperature above 102 F (38.9 C), not controlled by medicine. MAKE SURE YOU:   Understand these instructions.  Will watch your condition.  Will get help right away if you are not doing well or get worse. Document Released: 12/11/2005 Document Revised: 03/04/2012 Document Reviewed: 03/04/2009 ExitCare  Patient Information 2014 [place], [place]. MUGA Testing The medical term for the MUGA test is Electrocardiographic Multi-Gated Acquisition Study with First Pass Radionuclide Angiography (MUGA). MEANING OF TEST  The purpose of this test to check on how your heart's ventricles are working. It does this by looking at the muscle motion of the heart muscle while it is beating. The ventricles are the large muscular chambers in the lower part of your heart that pump blood to your lungs (right ventricle) and to the rest of your body (left ventricle). The test also checks the ejection fraction of the heart. This is the amount of blood that is being pushed out by the heart. The purpose of a MUGA study is to evaluate right and left ventricular wall motion and ejection fraction. There is no preparation for this test. NORMAL FINDINGS  Normal myocardial ejection fraction and coronary perfusion. Ranges for normal findings may vary among different laboratories and hospitals. You should always check with your doctor after having lab work or other tests done to discuss the meaning of your test results and whether your values are considered within normal limits. OBTAINING THE TEST RESULTS It is your responsibility to obtain your test results. Ask the lab or department performing the test when and how you will get your results. Document Released: 01/30/2006 Document Revised: 03/04/2012 Document Reviewed: 03/28/2007 [facility] Patient Information 2014 [place], [place].

## 2013-12-11 NOTE — Progress Notes (Signed)
[facility]  OFFICE PROGRESS Sally Kuba, MD [address] [place] [address]  DIAGNOSIS: Breast cancer, unspecified laterality - Plan: [place] [facility] [place]  Chief Complaint  Patient presents with  . 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  INTERVAL HISTORY: Sally [name] 57 y.o. female returns for followup after lumpectomy and sentinel node biopsy for recommendations regarding adjuvant therapy.[id number] She did develop a seroma in the left breast was was drained 2 days ago. Pain has considerably diminished since then. Otherwise she has no new complaints.  MEDICAL HISTORY: Past Medical History  Diagnosis Date  . Chronic sinusitis   . Hypertension   . Diabetes mellitus without complication     diet controlled  . Cancer 11/12/2013    left breast cancer    INTERIM HISTORY: has GANGLION-HAND/WRIST on her problem list.    ALLERGIES:  has no allergies on file.  MEDICATIONS: has a current medication list which includes the following prescription(s): atorvastatin, loratadine, PRESCRIPTION MEDICATION, triamterene-hydrochlorothiazide, hydrocodone-acetaminophen, and ibuprofen.  SURGICAL HISTORY:  Past Surgical History  Procedure Laterality Date  . Ectopic pregnancy surgery    . Abdominal hysterectomy    . Left leg surgery      17 yrs of age  . Ganglion cyst excision    . Dilation and curettage of uterus    . Colonoscopy  01/09/2012    Procedure: COLONOSCOPY;  Surgeon: Arlyce Harman, MD;  Location: [facility];  Service: Endoscopy;  Laterality: N/A;  1:30 pm  . Inguinal hernia repair Left   . Partial mastectomy with needle localization and axillary sentinel lymph node bx Left 11/12/2013    Procedure: PARTIAL  MASTECTOMY WITH NEEDLE LOCALIZATION AND AXILLARY SENTINEL LYMPH NODE BX;  Surgeon: Dalia Heading, MD;  Location: [facility] OR[facility];  Service: General;  Laterality: Left;    FAMILY HISTORY: family history includes Diabetes type II in her father and mother; Prostate cancer in her father.  SOCIAL HISTORY:  reports that she has been smoking Cigarettes.  She has a 3.75 pack-year smoking history. She has never used smokeless tobacco. She reports that she drinks alcohol. She reports that she does not use illicit drugs.  REVIEW OF SYSTEMS:  Other than that discussed above is noncontributory.  PHYSICAL EXAMINATION: ECOG PERFORMANCE STATUS: 0 - Asymptomatic  Blood pressure 133/83, pulse 91, temperature 97.8 F (36.6 C), temperature source Oral, resp. rate 16, weight 154 lb 6.4 oz (70.035 kg).  GENERAL:alert, no distress and comfortable SKIN: skin color, texture, turgor are normal, no rashes or significant lesions EYES: PERLA; Conjunctiva are pink and non-injected, sclera clear OROPHARYNX:no exudate, no erythema on lips, buccal mucosa, or tongue. NECK: supple, thyroid normal size, non-tender, without nodularity. No masses CHEST: Status post left breast lumpectomy with no recurrence of seroma since drainage 2 days ago. Right breast without mass. LYMPH:  no palpable lymphadenopathy in the cervical, axillary or inguinal LUNGS: clear to auscultation and percussion with normal breathing effort HEART: regular rate & rhythm and no murmurs. ABDOMEN:abdomen soft, non-tender and normal bowel sounds MUSCULOSKELETAL:no cyanosis of digits and no clubbing. Range of motion normal.  NEURO: alert & oriented x 3 with fluent speech, no focal motor/sensory deficits   LABORATORY DATA: Office Visit on 12/04/2013  Component  Date Value Range Status  . LH 12/04/2013 48.6   Final   Comment: (NOTE)                          Reference Ranges:                                  Female:     20 - 70 Years           1.5 -  9.3 mIU/mL                                                > 70 Years           3.1 - 34.6 mIU/mL                                  Female:   Follicular Phase        1.9 - 12.5 mIU/mL                                            Midcycle                8.7 - 76.3 mIU/mL                                            Luteal Phase            0.5 - 16.9 mIU/mL                                            Post Menopausal        15.9 - 54.0 mIU/mL                                            Pregnant                    <  1.5 mIU/mL                                            Contraceptives          0.7 -  5.6 mIU/mL                                  Children:                             <  6.0 mIU/mL  Performed at [facility]  . [facility] 12/04/2013 102.3   Final   Comment: (NOTE)                          Reference Ranges:                                  Female:                         1.4 -  18.1 mIU/mL                                  Female:   Follicular Phase    2.5 -  10.2 mIU/mL                                            MidCycle Peak       3.4 -  33.4 mIU/mL                                            Luteal Phase        1.5 -   9.1 mIU/mL                                            Post Menopausal    23.0 - 116.3 mIU/mL                                            Pregnant                <   0.3 mIU/mL                          Performed at [facility]  . WBC 12/04/2013 5.2  4.0 - 10.5 K/uL Final  . RBC 12/04/2013 4.15  3.87 - 5.11 MIL/uL Final  . Hemoglobin 12/04/2013 12.9  12.0 - 15.0 g/dL Final  . HCT 16/09/9603 37.9  36.0 - 46.0 % Final  . MCV 12/04/2013 91.3  78.0 - 100.0 fL Final  . MCH 12/04/2013 31.1  26.0 - 34.0 pg Final  . MCHC 12/04/2013 34.0  30.0 - 36.0 g/dL Final  . RDW 54/08/8118 13.7  11.5 - 15.5 % Final  . Platelets 12/04/2013 249  150 - 400 K/uL Final  . Neutrophils Relative % 12/04/2013 63  43 - 77 % Final  . Neutro Abs 12/04/2013 3.3  1.7 - 7.7 K/uL Final  .  Lymphocytes Relative 12/04/2013 27  12 - 46 % Final  . Lymphs Abs 12/04/2013 1.4  0.7 - 4.0 K/uL Final  . Monocytes Relative 12/04/2013 8  3 - 12 % Final  . Monocytes Absolute 12/04/2013 0.4  0.1 - 1.0 K/uL Final  . Eosinophils Relative 12/04/2013 2  0 - 5 % Final  .  Eosinophils Absolute 12/04/2013 0.1  0.0 - 0.7 K/uL Final  . Basophils Relative 12/04/2013 1  0 - 1 % Final  . Basophils Absolute 12/04/2013 0.0  0.0 - 0.1 K/uL Final  . Sodium 12/04/2013 136  135 - 145 mEq/L Final  . Potassium 12/04/2013 3.4* 3.5 - 5.1 mEq/L Final  . Chloride 12/04/2013 95* 96 - 112 mEq/L Final  . CO2 12/04/2013 28  19 - 32 mEq/L Final  . Glucose, Bld 12/04/2013 102* 70 - 99 mg/dL Final  . BUN 16/09/9603 17  6 - 23 mg/dL Final  . Creatinine, Ser 12/04/2013 0.99  0.50 - 1.10 mg/dL Final  . Calcium 54/08/8118 10.4  8.4 - 10.5 mg/dL Final  . Total Protein 12/04/2013 8.5* 6.0 - 8.3 g/dL Final  . Albumin 14/78/2956 4.6  3.5 - 5.2 g/dL Final  . AST 21/30/8657 30  0 - 37 U/L Final  . ALT 12/04/2013 30  0 - 35 U/L Final  . Alkaline Phosphatase 12/04/2013 199* 39 - 117 U/L Final  . Total Bilirubin 12/04/2013 0.7  0.3 - 1.2 mg/dL Final  . GFR calc non Af Amer 12/04/2013 63* >90 mL/min Final  . GFR calc Af Amer 12/04/2013 73* >90 mL/min Final   Comment: (NOTE)                          The eGFR has been calculated using the CKD EPI equation.                          This calculation has not been validated in all clinical situations.                          eGFR's persistently <90 mL/min signify possible Chronic Kidney                          Disease.  . CEA 12/04/2013 1.6  0.0 - 5.0 ng/mL Final   Performed at [facility]  . CA 27.29 12/04/2013 64* 0 - 39 U/mL Final   Performed at [facility]  . Estradiol 12/04/2013 13.5   Final   Comment: (NOTE)                            Males                           0.0 -  39.0 pg/mL                            Menstruating Females (by day in cycle  relative to LH peak)                            Follicular phase (-12 to -4)   19.5 - 144.2 pg/mL                            Midcycle          (-3 to +2)   63.9 - 356.7 pg/mL  Postmenopausal Females          0.0 -  32.2 pg/mL                              (untreated)                          Performed at [facility] Outpatient Visit on 11/12/2013  Component Date Value Range Status  . Glucose-Capillary 11/12/2013 94  70 - 99 mg/dL Final    PATHOLOGY:  Urinalysis No results found for this basename: colorurine, appearanceur, labspec, phurine, glucoseu, hgbur, bilirubinur, ketonesur, proteinur, urobilinogen, nitrite, leukocytesur    RADIOGRAPHIC STUDIES: Nm Sentinel Node Inj-no Rpt (breast)  11/12/2013   CLINICAL DATA: Left Breast cancer   Sulfur colloid was injected intradermally by the nuclear medicine  technologist for breast cancer sentinel node localization.    Mm Lt Plc Breast Loc Dev   1st Lesion  Inc Mammo Guide  11/12/2013   CLINICAL DATA:  Left breast cancer.  EXAM: NEEDLE LOCALIZATION OF THE LEFT BREAST WITH MAMMO GUIDANCE  COMPARISON:  Previous exams.  FINDINGS: Patient presents for needle localization prior to lumpectomy. I met with the patient and we discussed the procedure of needle localization including benefits and alternatives. We discussed the high likelihood of a successful procedure. We discussed the risks of the procedure, including infection, bleeding, tissue injury, and further surgery. Informed, written consent was given. The usual time-out protocol was performed immediately prior to the procedure.  Using mammographic guidance, sterile technique, 2% lidocaine and a 7 cm modified Kopans needle, the biopsy site marker was localized using lateral to medial approach. The films were marked for Dr. Lovell Sheehan.  Specimen radiograph was performed at the radiology department and confirms the hookwire and clip  to be present in the tissue sample. The specimen was marked for pathology.  IMPRESSION: Needle localization of the left breast. No apparent complications.   Electronically Signed   By: Jerene Dilling M.D.   On: 11/12/2013 11:14    ASSESSMENT:  #1. 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 Previous biopsy indicated ER positivity of 57% which was not corroborated on June, testing. #2. Left breast seroma, improved after drainage. #3. Hypertension, controlled. #4. Allergic rhinitis, on treatment, controlled. #5. Hyperlipidemia.   PLAN:  #1. Combination chemotherapy with docetaxel, doxorubicin, and cyclophosphamide given intravenously every 3 weeks for 6 cycles with Neulasta support. #2. Baseline MUGA scan. #3. LifePort by Dr. Lovell Sheehan. #4. Treatment to start 12/31/2013. Information was given to the patient about the 3 drugs as well as Neulasta. Appointment will be made with nurse navigator for more detailed indoctrination.   All questions were answered. The patient knows to call the clinic with any problems, questions or concerns. We can certainly see the patient much sooner if necessary.   I spent 25 minutes counseling the patient face to face. The total time spent in the appointment was 30 minutes.    Maurilio Lovely, MD 12/11/2013 9:56 AM

## 2013-12-16 ENCOUNTER — Encounter (HOSPITAL_COMMUNITY): Payer: Self-pay | Admitting: Pharmacy Technician

## 2013-12-17 NOTE — H&P (Signed)
Sally [name] is an 57 y.o. female.   Chief Complaint: Left breast invasive carcinoma HPI: Patient is a 57 year old black female status post left partial mastectomy and axillary sentinel lymph node biopsy in November 2014 who now presents for a Port-A-Cath insertion. She is about to undergo chemotherapy.  Past Medical History  Diagnosis Date  . Chronic sinusitis   . Hypertension   . Diabetes mellitus without complication     diet controlled  . Cancer 11/12/2013    left breast cancer    Past Surgical History  Procedure Laterality Date  . Ectopic pregnancy surgery    . Abdominal hysterectomy    . Left leg surgery      17 yrs of age  . Ganglion cyst excision    . Dilation and curettage of uterus    . Colonoscopy  01/09/2012    Procedure: COLONOSCOPY;  Surgeon: Arlyce Harman, MD;  Location: [facility];  Service: Endoscopy;  Laterality: N/A;  1:30 pm  . Inguinal hernia repair Left   . Partial mastectomy with needle localization and axillary sentinel lymph node bx Left 11/12/2013    Procedure: PARTIAL MASTECTOMY WITH NEEDLE LOCALIZATION AND AXILLARY SENTINEL LYMPH NODE BX;  Surgeon: Dalia Heading, MD;  Location: [facility];  Service: General;  Laterality: Left;    Family History  Problem Relation Age of Onset  . Diabetes type II Mother   . Diabetes type II Father   . Prostate cancer Father    Social History:  reports that she has been smoking Cigarettes.  She has a 3.75 pack-year smoking history. She has never used smokeless tobacco. She reports that she drinks alcohol. She reports that she does not use illicit drugs.  Allergies: No Known Allergies  No prescriptions prior to admission    No results found for this or any previous visit (from the past 48 hour(s)). No results found.  Review of Systems  Respiratory: Negative.   Cardiovascular: Negative.   All other systems reviewed and are negative.    There were no vitals taken for this visit. Physical Exam   Constitutional: She appears well-developed and well-nourished.  HENT:  Head: Normocephalic and atraumatic.  Neck: Normal range of motion. Neck supple.  Cardiovascular: Normal rate, regular rhythm and normal heart sounds.   Respiratory: Effort normal and breath sounds normal.  GI: Soft. Bowel sounds are normal.  Skin:  Left breast with healed surgical scar, decreasing hematoma noted.     Assessment/Plan Impression: Left breast carcinoma, need for central venous access Plan: Patient will undergo a Port-A-Cath insertion on 12/29/2013. The risks and benefits of the procedure including bleeding, infection, and pneumothorax were fully explained to the patient, who gave informed consent.  Sally [name] A 12/17/2013, 8:08 AM

## 2013-12-22 ENCOUNTER — Encounter (HOSPITAL_COMMUNITY): Payer: Self-pay

## 2013-12-22 ENCOUNTER — Encounter (HOSPITAL_COMMUNITY)
Admission: RE | Admit: 2013-12-22 | Discharge: 2013-12-22 | Disposition: A | Discharge status: Home / Self Care | Payer: BC Managed Care – PPO | Source: Ambulatory Visit | Attending: General Surgery | Admitting: General Surgery

## 2013-12-22 ENCOUNTER — Other Ambulatory Visit (HOSPITAL_COMMUNITY): Payer: Self-pay | Admitting: Hematology and Oncology

## 2013-12-22 ENCOUNTER — Other Ambulatory Visit (HOSPITAL_COMMUNITY): Payer: BC Managed Care – PPO

## 2013-12-22 DIAGNOSIS — Z01818 Encounter for other preprocedural examination: Secondary | ICD-10-CM | POA: Insufficient documentation

## 2013-12-22 MED ORDER — METOCLOPRAMIDE HCL 5 MG PO TABS: ORAL_TABLET | ORAL | Status: DC

## 2013-12-22 MED ORDER — LIDOCAINE-PRILOCAINE 2.5-2.5 % EX CREA: TOPICAL_CREAM | CUTANEOUS | Status: DC

## 2013-12-22 MED ORDER — DEXAMETHASONE 4 MG PO TABS: ORAL_TABLET | ORAL | Status: DC

## 2013-12-22 MED ORDER — PROCHLORPERAZINE MALEATE 10 MG PO TABS: ORAL_TABLET | ORAL | Status: DC

## 2013-12-22 NOTE — Patient Instructions (Signed)
Digna Countess Pavek  12/22/2013   Your procedure is scheduled on:  12/29/2012  Report to [facility] at 830  AM.  Call this number if you have problems the morning of surgery: [phone]   Remember:   Do not eat food or drink liquids after midnight.   Take these medicines the morning of surgery with A SIP OF WATER: norco, claritin, dyazide   Do not wear jewelry, make-up or nail polish.  Do not wear lotions, powders, or perfumes.   Do not shave 48 hours prior to surgery. Men may shave face and neck.  Do not bring valuables to the hospital.  [facility] is not responsible for any belongings or valuables.               Contacts, dentures or bridgework may not be worn into surgery.  Leave suitcase in the car. After surgery it may be brought to your room.  For patients admitted to the hospital, discharge time is determined by your treatment team.               Patients discharged the day of surgery will not be allowed to drive  home.  Name and phone number of your driver: family  Special Instructions: Shower using CHG 2 nights before surgery and the night before surgery.  If you shower the day of surgery use CHG.  Use special wash - you have one bottle of CHG for all showers.  You should use approximately 1/3 of the bottle for each shower.   Please read over the following fact sheets that you were given: Pain Booklet, Coughing and Deep Breathing, Surgical Site Infection Prevention, Anesthesia Post-op Instructions and Care and Recovery After Surgery Implanted Port Instructions An implanted port is a central line that has a round shape and is placed under the skin. It is used for long-term IV (intravenous) access for:  Medicine.  Fluids.  Liquid nutrition, such as TPN (total parenteral nutrition).  Blood samples. Ports can be placed:  In the chest area just below the collarbone (this is the most common place.)  In the arms.  In the belly (abdomen) area.  In the legs. PARTS  OF THE PORT A port has 2 main parts:  The reservoir. The reservoir is round, disc-shaped, and will be a small, raised area under your skin.  The reservoir is the part where a needle is inserted (accessed) to either give medicines or to draw blood.  The catheter. The catheter is a long, slender tube that extends from the reservoir. The catheter is placed into a large vein.  Medicine that is inserted into the reservoir goes into the catheter and then into the vein. INSERTION OF THE PORT  The port is surgically placed in either an operating room or in a procedural area (interventional radiology).  Medicine may be given to help you relax during the procedure.  The skin where the port will be inserted is numbed (local anesthetic).  1 or 2 small cuts (incisions) will be made in the skin to insert the port.  The port can be used after it has been inserted. INCISION SITE CARE  The incision site may have small adhesive strips on it. This helps keep the incision site closed. Sometimes, no adhesive strips are placed. Instead of adhesive strips, a special kind of surgical glue is used to keep the incision closed.  If adhesive strips were placed on the incision sites, do not take them off.  They will fall off on their own.  The incision site may be sore for 1 to 2 days. Pain medicine can help.  Do not get the incision site wet. Bathe or shower as directed by your caregiver.  The incision site should heal in 5 to 7 days. A small scar may form after the incision has healed. ACCESSING THE PORT Special steps must be taken to access the port:  Before the port is accessed, a numbing cream can be placed on the skin. This helps numb the skin over the port site.  A sterile technique is used to access the port.  The port is accessed with a needle. Only "non-coring" port needles should be used to access the port. Once the port is accessed, a blood return should be checked. This helps ensure the port is  in the vein and is not clogged (clotted).  If your caregiver believes your port should remain accessed, a clear (transparent) bandage will be placed over the needle site. The bandage and needle will need to be changed every week or as directed by your caregiver.  Keep the bandage covering the needle clean and dry. Do not get it wet. Follow your caregiver's instructions on how to take a shower or bath when the port is accessed.  If your port does not need to stay accessed, no bandage is needed over the port. FLUSHING THE PORT Flushing the port keeps it from getting clogged. How often the port is flushed depends on:  If a constant infusion is running. If a constant infusion is running, the port may not need to be flushed.  If intermittent medicines are given.  If the port is not being used. For intermittent medicines:  The port will need to be flushed:  After medicines have been given.  After blood has been drawn.  As part of routine maintenance.  A port is normally flushed with:  Normal saline.  Heparin.  Follow your caregiver's advice on how often, how much, and the type of flush to use on your port. IMPORTANT PORT INFORMATION  Tell your caregiver if you are allergic to heparin.  After your port is placed, you will get a manufacturer's information card. The card has information about your port. Keep this card with you at all times.  There are many types of ports available. Know what kind of port you have.  In case of an emergency, it may be helpful to wear a medical alert bracelet. This can help alert health care workers that you have a port.  The port can stay in for as long as your caregiver believes it is necessary.  When it is time for the port to come out, surgery will be done to remove it. The surgery will be similar to how the port was put in.  If you are in the hospital or clinic:  Your port will be taken care of and flushed by a nurse.  If you are at  home:  A home health care nurse may give medicines and take care of the port.  You or a family member can get special training and directions for giving medicine and taking care of the port at home. SEEK IMMEDIATE MEDICAL CARE IF:   Your port does not flush or you are unable to get a blood return.  New drainage or pus is coming from the incision.  A bad smell is coming from the incision site.  You develop swelling or increased redness at the  incision site.  You develop increased swelling or pain at the port site.  You develop swelling or pain in the surrounding skin near the port.  You have an oral temperature above 102 F (38.9 C), not controlled by medicine. MAKE SURE YOU:   Understand these instructions.  Will watch your condition.  Will get help right away if you are not doing well or get worse. Document Released: 12/11/2005 Document Revised: 03/04/2012 Document Reviewed: 03/04/2009 [facility] Patient Information 2014 [place], [place]. PATIENT INSTRUCTIONS POST-ANESTHESIA  IMMEDIATELY FOLLOWING SURGERY:  Do not drive or operate machinery for the first twenty four hours after surgery.  Do not make any important decisions for twenty four hours after surgery or while taking narcotic pain medications or sedatives.  If you develop intractable nausea and vomiting or a severe headache please notify your doctor immediately.  FOLLOW-UP:  Please make an appointment with your surgeon as instructed. You do not need to follow up with anesthesia unless specifically instructed to do so.  WOUND CARE INSTRUCTIONS (if applicable):  Keep a dry clean dressing on the anesthesia/puncture wound site if there is drainage.  Once the wound has quit draining you may leave it open to air.  Generally you should leave the bandage intact for twenty four hours unless there is drainage.  If the epidural site drains for more than 36-48 hours please call the anesthesia department.  QUESTIONS?:  Please feel free  to call your physician or the hospital operator if you have any questions, and they will be happy to assist you.

## 2013-12-22 NOTE — Patient Instructions (Signed)
[facility]   CHEMOTHERAPY INSTRUCTIONS  Adriamycin - bone marrow suppression, nausea, vomiting, hair loss, mouth sores, cardiotoxicity (this is why we do the 2D Echoes), sensitivity to light, will turn urine red for a few voids after receiving it.  Cytoxan - can cause hemorrhagic cystitis (bloody urine) - this chemo irritates your bladder! We need you drinking 64 oz of fluid (preferably water/decaff fluids) 2 days prior to chemo and for up to 4-5 days after chemo. Drink more if you can. Do not hold your urine. Urinate before you go to bed and if you wake up in the middle of the night. This can also cause nausea/vomiting and hair loss.  Taxotere - bone marrow suppression (lowers white blood cells (fight infection), lowers red blood cells (make up your blood), lowers platelets (help blood to clot). This chemo can cause fluid retention. You will be responsible for taking a steroid called Dexamethasone at home prior to and after Taxotere. This steroid will keep you from having fluid retention. Take it whether you think you need it or not. Can cause hair loss, skin/nail changes (darkening of the nail beds, pain where the nail bed meets the skin, loosening of the nail beds, dry skin, palms of hands and soles of feet may darken or get sensitive, nausea/vomiting, paresthesia (numbness or tingling) in extremities - we need to know if this develops, mucositis (inflammation of any mucosal membrane - the mouth, throat), mouth sores, neurotoxicity (loss of memory, headaches, trouble sleeping, etc.), can also cause excessive tear production. Please let us know if any side effect develops.   Neulasta - this medication is not chemo but being given because you have had chemo. It is usually given 20-24 hours after the completion of chemotherapy. This medication works by boosting your bone marrow's supply of white blood cells. White blood cells are what protect our bodies against  infection. The medication is given in the form of a subcutaneous injection. It is given in the fatty tissue of your abdomen. It is a short needle. The major side effect of this medication is bone or muscle pain. The drug of choice to relieve or lessen the pain is Aleve or Ibuprofen. If a physician has ever told you not to take Aleve or Ibuprofen - then don't take it. You should then take Tylenol/acetaminophen. Take either medication as the bottle directs you to.  The level of pain you experience as a result of this injection can range from none, to mild or moderate, or severe. Please let us know if you develop moderate or severe bone pain.      POTENTIAL SIDE EFFECTS OF TREATMENT: Increased Susceptibility to Infection, Vomiting, Constipation, Red or Pink Urine (with Adriamycin), Hair Thinning, Changes in Character of Skin and Nails (brittleness, dryness,etc.), Pigment Changes (darkening of nail beds, palms of hands, soles of feet, etc.), Bone Marrow Suppression, Blood in Urine, Complete Hair Loss, Nausea, Diarrhea, Sun Sensitivity and Mouth Sores   SELF IMAGE NEEDS AND REFERRALS MADE: Obtain hair accessories as soon as possible (wigs, scarves, turbans,caps,etc.) and Referral to Look Good, Feel Better consultant   EDUCATIONAL MATERIALS GIVEN AND REVIEWED: Chemotherapy and You  Specific Instructions Sheets: Adriamycin, Cytoxan, Taxotere, Neulasta, Dexamethasone, Aloxi, Emend, Metoclopramide, Prochlorperazine, EMLA cream   SELF CARE ACTIVITIES WHILE ON CHEMOTHERAPY: Increase your fluid intake 48 hours prior to treatment and drink at least 2 quarts per day after treatment., No alcohol intake., No aspirin or other medications unless approved by your oncologist.,  Eat foods that are light and easy to digest., Eat foods at cold or room temperature., No fried, fatty, or spicy foods immediately before or after treatment., Have teeth cleaned professionally before starting treatment. Keep dentures and partial  plates clean., Use soft toothbrush and do not use mouthwashes that contain alcohol. Biotene is a good mouthwash. Use warm salt water gargles (1 teaspoon salt per 1 quart warm water) before and after meals and at bedtime. Or you may rinse with 2 tablespoons of three -percent hydrogen peroxide mixed in eight ounces of water., Always use sunscreen with SPF (Sun Protection Factor) of 30 or higher., Use your nausea medication as directed to prevent nausea., Use your stool softener or laxative as directed to prevent constipation. and Use your anti-diarrheal medication as directed to stop diarrhea.  Please wash your hands for at least 30 seconds using warm soapy water. Handwashing is the #1 way to prevent the spread of germs. Stay away from sick people or people who are getting over a cold. If you develop respiratory systems such as green/yellow mucus production or productive cough or persistent cough let us know and we will see if you need an antibiotic. It is a good idea to keep a pair of gloves on when going into grocery stores/[employer] to decrease your risk of coming into contact with germs on the carts, etc. Carry alcohol hand gel with you at all times and use it frequently if out in public. All foods need to be cooked thoroughly. No raw foods. No medium or undercooked meats, eggs. If your food is cooked medium well, it does not need to be hot pink or saturated with bloody liquid at all. Vegetables and fruits need to be washed/rinsed under the faucet with a dish detergent before being consumed. You can eat raw fruits and vegetables unless we tell you otherwise but it would be best if you cooked them or bought frozen. Do not eat off of salad bars or hot bars unless you really trust the cleanliness of the restaurant. If you need dental work, please let us know before you go for your appointment so that we can coordinate the best possible time for you in regards to your chemo regimen. You need to also let your dentist  know that you are actively taking chemo. We may need to do labs prior to your dental appointment. We also want your bowels moving at least every other day. If this is not happening, we need to know so that we can get you on a bowel regimen to help you go.     MEDICATIONS: You have been given prescriptions for the following medications:  Dexamethasone 4mg  tablet. The day before chemo, take 2 tabs in the am and 2 tabs in the pm. Then starting the day after chemo, take 2 tabs in the am and 2 tabs in the pm for 2 days. Then Stop. Repeat with each chemo.   Metoclopramide 5mg  tablet. Starting the day after chemo, take 1-2 tablets four times a day x 48 hours. Then may take 1-2 tablets four times a day IF needed for nausea/vomiting.   Prochlorperazine 10mg  tablet. Starting the day after chemo, take 1 tablet four times a day x 48 hours. Then may take 1-2 tablets four times a day IF needed for nausea/vomiting.   EMLA cream. Apply a quarter size amount to port site 1 hour prior to chemo. Do not rub in. Cover with plastic wrap.   Over-the-Counter Meds:  Colace -  this is a stool softener. Take 100mg  capsule 2-6 times a day as needed. If you have to take more than 6 capsules of Colace a day call the Cancer Center.  Senna - this is a mild laxative used to treat mild constipation. May take 2 tabs by mouth daily or up to twice a day as needed for mild constipation.  Milk of Magnesia - this is a laxative used to treat moderate to severe constipation. May take 2-4 tablespoons every 8 hours as needed. May increase to 8 tablespoons x 1 dose and if no bowel movement call the Cancer Center.  Imodium - this is for diarrhea. Take 2 tabs after 1st loose stool and then 1 tab after each loose stool until you go a total of 12 hours without a loose stool. Call Cancer Center if loose stools continue. If it is weekday and you develop diarrhea, please call the Cancer Center and we will order you a prescription strength  anti-diarrheal medication.    SYMPTOMS TO REPORT AS SOON AS POSSIBLE AFTER TREATMENT:  FEVER GREATER THAN 100.5 F  CHILLS WITH OR WITHOUT FEVER  NAUSEA AND VOMITING THAT IS NOT CONTROLLED WITH YOUR NAUSEA MEDICATION  UNUSUAL SHORTNESS OF BREATH  UNUSUAL BRUISING OR BLEEDING  TENDERNESS IN MOUTH AND THROAT WITH OR WITHOUT PRESENCE OF ULCERS  URINARY PROBLEMS  BOWEL PROBLEMS  UNUSUAL RASH    Wear comfortable clothing and clothing appropriate for easy access to any Portacath or PICC line. Let us know if there is anything that we can do to make your therapy better!      I have been informed and understand all of the instructions given to me and have received a copy. I have been instructed to call the clinic [phone] or my family physician as soon as possible for continued medical care, if indicated. I do not have any more questions at this time but understand that I may call the Cancer Center or the Patient Navigator at [phone] during office hours should I have questions or need assistance in obtaining follow-up care.      _________________________________________      _______________     __________ Signature of Patient or Authorized Representative        Date                            Time      _________________________________________ Nurse's Signature      Doxorubicin injection What is this medicine? DOXORUBICIN (dox oh ROO bi sin) is a chemotherapy drug. It is used to treat many kinds of cancer like Hodgkin's disease, leukemia, non-Hodgkin's lymphoma, neuroblastoma, sarcoma, and Wilms' tumor. It is also used to treat bladder cancer, breast cancer, lung cancer, ovarian cancer, stomach cancer, and thyroid cancer. This medicine may be used for other purposes; ask your health care provider or pharmacist if you have questions. COMMON BRAND NAME(S): Adriamycin PFS, Adriamycin RDF, Adriamycin, Rubex What should I tell my health care provider before I take  this medicine? They need to know if you have any of these conditions: -blood disorders -heart disease, recent heart attack -infection (especially a virus infection such as chickenpox, cold sores, or herpes) -irregular heartbeat -liver disease -recent or ongoing radiation therapy -an unusual or allergic reaction to doxorubicin, other chemotherapy agents, other medicines, foods, dyes, or preservatives -pregnant or trying to get pregnant -breast-feeding How should I use this medicine? This drug is given as an  infusion into a vein. It is administered in a hospital or clinic by a specially trained health care professional. If you have pain, swelling, burning or any unusual feeling around the site of your injection, tell your health care professional right away. Talk to your pediatrician regarding the use of this medicine in children. Special care may be needed. Overdosage: If you think you have taken too much of this medicine contact a poison control center or emergency room at once. NOTE: This medicine is only for you. Do not share this medicine with others. What if I miss a dose? It is important not to miss your dose. Call your doctor or health care professional if you are unable to keep an appointment. What may interact with this medicine? Do not take this medicine with any of the following medications: -cisapride -droperidol -halofantrine -pimozide -zidovudine This medicine may also interact with the following medications: -chloroquine -chlorpromazine -clarithromycin -cyclophosphamide -cyclosporine -erythromycin -medicines for depression, anxiety, or psychotic disturbances -medicines for irregular heart beat like amiodarone, bepridil, dofetilide, encainide, flecainide, propafenone, quinidine -medicines for seizures like ethotoin, fosphenytoin, phenytoin -medicines for nausea, vomiting like dolasetron, ondansetron, palonosetron -medicines to increase blood counts like filgrastim,  pegfilgrastim, sargramostim -methadone -methotrexate -pentamidine -progesterone -vaccines -verapamil Talk to your doctor or health care professional before taking any of these medicines: -acetaminophen -aspirin -ibuprofen -ketoprofen -naproxen This list may not describe all possible interactions. Give your health care provider a list of all the medicines, herbs, non-prescription drugs, or dietary supplements you use. Also tell them if you smoke, drink alcohol, or use illegal drugs. Some items may interact with your medicine. What should I watch for while using this medicine? Your condition will be monitored carefully while you are receiving this medicine. You will need important blood work done while you are taking this medicine. This drug may make you feel generally unwell. This is not uncommon, as chemotherapy can affect healthy cells as well as cancer cells. Report any side effects. Continue your course of treatment even though you feel ill unless your doctor tells you to stop. Your urine may turn red for a few days after your dose. This is not blood. If your urine is dark or brown, call your doctor. In some cases, you may be given additional medicines to help with side effects. Follow all directions for their use. Call your doctor or health care professional for advice if you get a fever, chills or sore throat, or other symptoms of a cold or flu. Do not treat yourself. This drug decreases your body's ability to fight infections. Try to avoid being around people who are sick. This medicine may increase your risk to bruise or bleed. Call your doctor or health care professional if you notice any unusual bleeding. Be careful brushing and flossing your teeth or using a toothpick because you may get an infection or bleed more easily. If you have any dental work done, tell your dentist you are receiving this medicine. Avoid taking products that contain aspirin, acetaminophen, ibuprofen, naproxen,  or ketoprofen unless instructed by your doctor. These medicines may hide a fever. Men and women of childbearing age should use effective birth control methods while using taking this medicine. Do not become pregnant while taking this medicine. There is a potential for serious side effects to an unborn child. Talk to your health care professional or pharmacist for more information. Do not breast-feed an infant while taking this medicine. Do not let others touch your urine or other body fluids for  5 days after each treatment with this medicine. Caregivers should wear latex gloves to avoid touching body fluids during this time. There is a maximum amount of this medicine you should receive throughout your life. The amount depends on the medical condition being treated and your overall health. Your doctor will watch how much of this medicine you receive in your lifetime. Tell your doctor if you have taken this medicine before. What side effects may I notice from receiving this medicine? Side effects that you should report to your doctor or health care professional as soon as possible: -allergic reactions like skin rash, itching or hives, swelling of the face, lips, or tongue -low blood counts - this medicine may decrease the number of white blood cells, red blood cells and platelets. You may be at increased risk for infections and bleeding. -signs of infection - fever or chills, cough, sore throat, pain or difficulty passing urine -signs of decreased platelets or bleeding - bruising, pinpoint red spots on the skin, black, tarry stools, blood in the urine -signs of decreased red blood cells - unusually weak or tired, fainting spells, lightheadedness -breathing problems -chest pain -fast, irregular heartbeat -mouth sores -nausea, vomiting -pain, swelling, redness at site where injected -pain, tingling, numbness in the hands or feet -swelling of ankles, feet, or hands -unusual bleeding or bruising Side  effects that usually do not require medical attention (report to your doctor or health care professional if they continue or are bothersome): -diarrhea -facial flushing -hair loss -loss of appetite -missed menstrual periods -nail discoloration or damage -red or watery eyes -red colored urine -stomach upset This list may not describe all possible side effects. Call your doctor for medical advice about side effects. You may report side effects to FDA at 1-800-FDA-1088. Where should I keep my medicine? This drug is given in a hospital or clinic and will not be stored at home. NOTE: This sheet is a summary. It may not cover all possible information. If you have questions about this medicine, talk to your doctor, pharmacist, or health care provider.  2014, [employer] Standard. (2013-04-08 09:54:34) Cyclophosphamide tablets What is this medicine? CYCLOPHOSPHAMIDE (sye kloe FOSS fa mide) is a chemotherapy drug. It slows the growth of cancer cells. This medicine is used to treat many types of cancer like lymphoma, myeloma, leukemia, breast cancer, and ovarian cancer, to name a few. It is also used to treat nephrotic syndrome in children. This medicine may be used for other purposes; ask your health care provider or pharmacist if you have questions. COMMON BRAND NAME(S): Cytoxan What should I tell my health care provider before I take this medicine? They need to know if you have any of these conditions: -blood disorders -infection -kidney disease -liver disease -recent or ongoing radiation therapy -an unusual or allergic reaction to cyclophosphamide, other chemotherapy, other medicines, foods, dyes, or preservatives -pregnant or trying to get pregnant -breast-feeding How should I use this medicine? Take this medicine by mouth with a glass of water. Follow the directions on the prescription label. Do not cut, crush or chew this medicine. Take your medicine at regular intervals. Do not take  it more often than directed. Do not stop taking except on your doctor's advice. Talk to your pediatrician regarding the use of this medicine in children. Special care may be needed. Overdosage: If you think you have taken too much of this medicine contact a poison control center or emergency room at once. NOTE: This medicine is only for you. Do  not share this medicine with others. What if I miss a dose? If you miss a dose, take it as soon as you can. If it is almost time for your next dose, take only that dose. Do not take double or extra doses. What may interact with this medicine? This medicine may interact with the following medications: -amiodarone -amphotericin B -azathioprine -certain antiviral medicines for HIV or AIDS such as protease inhibitors (e.g., indinavir, ritonavir) and zidovudine -certain blood pressure medications such as benazepril, captopril, enalapril, fosinopril, lisinopril, moexipril, monopril, perindopril, quinapril, ramipril, trandolapril -certain cancer medications such as anthracyclines (e.g., daunorubicin, doxorubicin), busulfan, cytarabine, paclitaxel, pentostatin, tamoxifen, trastuzumab -certain diuretics such as chlorothiazide, chlorthalidone, hydrochlorothiazide, indapamide, metolazone -certain medicines that treat or prevent blood clots like warfarin -certain muscle relaxants such as succinylcholine -cyclosporine -etanercept -indomethacin -medicines to increase blood counts like filgrastim, pegfilgrastim, sargramostim -medicines used as general anesthesia -metronidazole -natalizumab This list may not describe all possible interactions. Give your health care provider a list of all the medicines, herbs, non-prescription drugs, or dietary supplements you use. Also tell them if you smoke, drink alcohol, or use illegal drugs. Some items may interact with your medicine. What should I watch for while using this medicine? Visit your doctor for checks on your  progress. This drug may make you feel generally unwell. This is not uncommon, as chemotherapy can affect healthy cells as well as cancer cells. Report any side effects. Continue your course of treatment even though you feel ill unless your doctor tells you to stop. Drink water or other fluids as directed. Urinate often, even at night. In some cases, you may be given additional medicines to help with side effects. Follow all directions for their use. Call your doctor or health care professional for advice if you get a fever, chills or sore throat, or other symptoms of a cold or flu. Do not treat yourself. This drug decreases your body's ability to fight infections. Try to avoid being around people who are sick. This medicine may increase your risk to bruise or bleed. Call your doctor or health care professional if you notice any unusual bleeding. Be careful brushing and flossing your teeth or using a toothpick because you may get an infection or bleed more easily. If you have any dental work done, tell your dentist you are receiving this medicine. You may get drowsy or dizzy. Do not drive, use machinery, or do anything that needs mental alertness until you know how this medicine affects you. Do not become pregnant while taking this medicine or for 1 year after stopping it. Women should inform their doctor if they wish to become pregnant or think they might be pregnant. Men should not father a child while taking this medicine and for 4 months after stopping it. There is a potential for serious side effects to an unborn child. Talk to your health care professional or pharmacist for more information. Do not breast-feed an infant while taking this medicine. This medicine may interfere with the ability to have a child. This medicine has caused ovarian failure in some women. This medicine has caused reduced sperm counts in some men. You should talk with your doctor or health care professional if you are concerned  about your fertility. If you are going to have surgery, tell your doctor or health care professional that you have taken this medicine. What side effects may I notice from receiving this medicine? Side effects that you should report to your doctor or health care professional as  soon as possible: -allergic reactions like skin rash, itching or hives, swelling of the face, lips, or tongue -low blood counts - this medicine may decrease the number of white blood cells, red blood cells and platelets. You may be at increased risk for infections and bleeding. -signs of infection - fever or chills, cough, sore throat, pain or difficulty passing urine -signs of decreased platelets or bleeding - bruising, pinpoint red spots on the skin, black, tarry stools, blood in the urine -signs of decreased red blood cells - unusually weak or tired, fainting spells, lightheadedness -breathing problems -dark urine -dizziness -palpitations -swelling of the ankles, feet, hands -trouble passing urine or change in the amount of urine -weight gain -yellowing of the eyes or skin Side effects that usually do not require medical attention (report to your doctor or health care professional if they continue or are bothersome): -changes in nail or skin color -hair loss -missed menstrual periods -mouth sores -nausea, vomiting This list may not describe all possible side effects. Call your doctor for medical advice about side effects. You may report side effects to FDA at 1-800-FDA-1088. Where should I keep my medicine? Keep out of the reach of children. Store at room temperature at or below 25 degrees C (77 degrees F). This medicine can be stored at room temperatures of up to 30 degrees C (86 degrees F) for a short time. Protect from temperatures above 30 degrees C (86 degrees F). Throw away any unused medicine after the expiration date. NOTE: This sheet is a summary. It may not cover all possible information. If you have  questions about this medicine, talk to your doctor, pharmacist, or health care provider.  2014, [employer] Standard. (2012-10-25 16:42:57) Docetaxel injection What is this medicine? DOCETAXEL (doe se TAX el) is a chemotherapy drug. It targets fast dividing cells, like cancer cells, and causes these cells to die. This medicine is used to treat many types of cancers like breast cancer, certain stomach cancers, head and neck cancer, lung cancer, and prostate cancer. This medicine may be used for other purposes; ask your health care provider or pharmacist if you have questions. COMMON BRAND NAME(S): Docefrez , Taxotere What should I tell my health care provider before I take this medicine? They need to know if you have any of these conditions: -infection (especially a virus infection such as chickenpox, cold sores, or herpes) -liver disease -low blood counts, like low white cell, platelet, or red cell counts -an unusual or allergic reaction to docetaxel, polysorbate 80, other chemotherapy agents, other medicines, foods, dyes, or preservatives -pregnant or trying to get pregnant -breast-feeding How should I use this medicine? This drug is given as an infusion into a vein. It is administered in a hospital or clinic by a specially trained health care professional. Talk to your pediatrician regarding the use of this medicine in children. Special care may be needed. Overdosage: If you think you have taken too much of this medicine contact a poison control center or emergency room at once. NOTE: This medicine is only for you. Do not share this medicine with others. What if I miss a dose? It is important not to miss your dose. Call your doctor or health care professional if you are unable to keep an appointment. What may interact with this medicine? -cyclosporine -erythromycin -ketoconazole -medicines to increase blood counts like filgrastim, pegfilgrastim, sargramostim -vaccines Talk to your  doctor or health care professional before taking any of these medicines: -acetaminophen -aspirin -ibuprofen -ketoprofen -naproxen  This list may not describe all possible interactions. Give your health care provider a list of all the medicines, herbs, non-prescription drugs, or dietary supplements you use. Also tell them if you smoke, drink alcohol, or use illegal drugs. Some items may interact with your medicine. What should I watch for while using this medicine? Your condition will be monitored carefully while you are receiving this medicine. You will need important blood work done while you are taking this medicine. This drug may make you feel generally unwell. This is not uncommon, as chemotherapy can affect healthy cells as well as cancer cells. Report any side effects. Continue your course of treatment even though you feel ill unless your doctor tells you to stop. In some cases, you may be given additional medicines to help with side effects. Follow all directions for their use. Call your doctor or health care professional for advice if you get a fever, chills or sore throat, or other symptoms of a cold or flu. Do not treat yourself. This drug decreases your body's ability to fight infections. Try to avoid being around people who are sick. This medicine may increase your risk to bruise or bleed. Call your doctor or health care professional if you notice any unusual bleeding. Be careful brushing and flossing your teeth or using a toothpick because you may get an infection or bleed more easily. If you have any dental work done, tell your dentist you are receiving this medicine. Avoid taking products that contain aspirin, acetaminophen, ibuprofen, naproxen, or ketoprofen unless instructed by your doctor. These medicines may hide a fever. Do not become pregnant while taking this medicine. Women should inform their doctor if they wish to become pregnant or think they might be pregnant. There is a  potential for serious side effects to an unborn child. Talk to your health care professional or pharmacist for more information. Do not breast-feed an infant while taking this medicine. What side effects may I notice from receiving this medicine? Side effects that you should report to your doctor or health care professional as soon as possible: -allergic reactions like skin rash, itching or hives, swelling of the face, lips, or tongue -low blood counts - This drug may decrease the number of white blood cells, red blood cells and platelets. You may be at increased risk for infections and bleeding. -signs of infection - fever or chills, cough, sore throat, pain or difficulty passing urine -signs of decreased platelets or bleeding - bruising, pinpoint red spots on the skin, black, tarry stools, nosebleeds -signs of decreased red blood cells - unusually weak or tired, fainting spells, lightheadedness -breathing problems -fast or irregular heartbeat -low blood pressure -mouth sores -nausea and vomiting -pain, swelling, redness or irritation at the injection site -pain, tingling, numbness in the hands or feet -swelling of the ankle, feet, hands -weight gain Side effects that usually do not require medical attention (report to your prescriber or health care professional if they continue or are bothersome): -bone pain -complete hair loss including hair on your head, underarms, pubic hair, eyebrows, and eyelashes -diarrhea -excessive tearing -changes in the color of fingernails -loosening of the fingernails -nausea -muscle pain -red flush to skin -sweating -weak or tired This list may not describe all possible side effects. Call your doctor for medical advice about side effects. You may report side effects to FDA at 1-800-FDA-1088. Where should I keep my medicine? This drug is given in a hospital or clinic and will not be stored at home.  NOTE: This sheet is a summary. It may not cover all  possible information. If you have questions about this medicine, talk to your doctor, pharmacist, or health care provider.  2014, [employer]. (2008-11-23 11:52:10) Pegfilgrastim injection What is this medicine? PEGFILGRASTIM (peg fil GRA stim) helps the body make more white blood cells. It is used to prevent infection in people with low amounts of white blood cells following cancer treatment. This medicine may be used for other purposes; ask your health care provider or pharmacist if you have questions. COMMON BRAND NAME(S): Neulasta What should I tell my health care provider before I take this medicine? They need to know if you have any of these conditions: -sickle cell disease -an unusual or allergic reaction to pegfilgrastim, filgrastim, E.coli protein, other medicines, foods, dyes, or preservatives -pregnant or trying to get pregnant -breast-feeding How should I use this medicine? This medicine is for injection under the skin. It is usually given by a health care professional in a hospital or clinic setting. If you get this medicine at home, you will be taught how to prepare and give this medicine. Do not shake this medicine. Use exactly as directed. Take your medicine at regular intervals. Do not take your medicine more often than directed. It is important that you put your used needles and syringes in a special sharps container. Do not put them in a trash can. If you do not have a sharps container, call your pharmacist or healthcare provider to get one. Talk to your pediatrician regarding the use of this medicine in children. While this drug may be prescribed for children who weigh more than 45 kg for selected conditions, precautions do apply Overdosage: If you think you have taken too much of this medicine contact a poison control center or emergency room at once. NOTE: This medicine is only for you. Do not share this medicine with others. What if I miss a dose? If you miss a  dose, take it as soon as you can. If it is almost time for your next dose, take only that dose. Do not take double or extra doses. What may interact with this medicine? -lithium -medicines for growth therapy This list may not describe all possible interactions. Give your health care provider a list of all the medicines, herbs, non-prescription drugs, or dietary supplements you use. Also tell them if you smoke, drink alcohol, or use illegal drugs. Some items may interact with your medicine. What should I watch for while using this medicine? Visit your doctor for regular check ups. You will need important blood work done while you are taking this medicine. What side effects may I notice from receiving this medicine? Side effects that you should report to your doctor or health care professional as soon as possible: -allergic reactions like skin rash, itching or hives, swelling of the face, lips, or tongue -breathing problems -fever -pain, redness, or swelling where injected -shoulder pain -stomach or side pain Side effects that usually do not require medical attention (report to your doctor or health care professional if they continue or are bothersome): -aches, pains -headache -loss of appetite -nausea, vomiting -unusually tired This list may not describe all possible side effects. Call your doctor for medical advice about side effects. You may report side effects to FDA at 1-800-FDA-1088. Where should I keep my medicine? Keep out of the reach of children. Store in a refrigerator between 2 and 8 degrees C (36 and 46 degrees F). Do not freeze. Keep  in carton to protect from light. Throw away this medicine if it is left out of the refrigerator for more than 48 hours. Throw away any unused medicine after the expiration date. NOTE: This sheet is a summary. It may not cover all possible information. If you have questions about this medicine, talk to your doctor, pharmacist, or health care  provider.  2014, [employer]. (2008-07-13 15:41:44) Dexamethasone tablets What is this medicine? DEXAMETHASONE (dex a METH a sone) is a corticosteroid. It is commonly used to treat inflammation of the skin, joints, lungs, and other organs. Common conditions treated include asthma, allergies, and arthritis. It is also used for other conditions, such as blood disorders and diseases of the adrenal glands. This medicine may be used for other purposes; ask your health care provider or pharmacist if you have questions. COMMON BRAND NAME(S): Decadron, DexPak Dorothea Ogle, DexPak TaperPak, Zema-Pak What should I tell my health care provider before I take this medicine? They need to know if you have any of these conditions: -Cushing's syndrome -diabetes -glaucoma -heart problems or disease -high blood pressure -infection like herpes, measles, tuberculosis, or chickenpox -kidney disease -liver disease -mental problems -myasthenia gravis -osteoporosis -previous heart attack -seizures -stomach, ulcer or intestine disease including colitis and diverticulitis -thyroid problem -an unusual or allergic reaction to dexamethasone, corticosteroids, other medicines, lactose, foods, dyes, or preservatives -pregnant or trying to get pregnant -breast-feeding How should I use this medicine? Take this medicine by mouth with a drink of water. Follow the directions on the prescription label. Take it with food or milk to avoid stomach upset. If you are taking this medicine once a day, take it in the morning. Do not take more medicine than you are told to take. Do not suddenly stop taking your medicine because you may develop a severe reaction. Your doctor will tell you how much medicine to take. If your doctor wants you to stop the medicine, the dose may be slowly lowered over time to avoid any side effects. Talk to your pediatrician regarding the use of this medicine in children. Special care may be  needed. Patients over 25 years old may have a stronger reaction and need a smaller dose. Overdosage: If you think you have taken too much of this medicine contact a poison control center or emergency room at once. NOTE: This medicine is only for you. Do not share this medicine with others. What if I miss a dose? If you miss a dose, take it as soon as you can. If it is almost time for your next dose, talk to your doctor or health care professional. You may need to miss a dose or take an extra dose. Do not take double or extra doses without advice. What may interact with this medicine? Do not take this medicine with any of the following medications: -mifepristone, RU-486 -vaccines This medicine may also interact with the following medications: -amphotericin B -antibiotics like clarithromycin, erythromycin, and troleandomycin -aspirin and aspirin-like drugs -barbiturates like phenobarbital -carbamazepine -cholestyramine -cholinesterase inhibitors like donepezil, galantamine, rivastigmine, and tacrine -cyclosporine -digoxin -diuretics -ephedrine -female hormones, like estrogens or progestins and birth control pills -indinavir -isoniazid -ketoconazole -medicines for diabetes -medicines that improve muscle tone or strength for conditions like myasthenia gravis -NSAIDs, medicines for pain and inflammation, like ibuprofen or naproxen -phenytoin -rifampin -thalidomide -warfarin This list may not describe all possible interactions. Give your health care provider a list of all the medicines, herbs, non-prescription drugs, or dietary supplements you use. Also tell them if  you smoke, drink alcohol, or use illegal drugs. Some items may interact with your medicine. What should I watch for while using this medicine? Visit your doctor or health care professional for regular checks on your progress. If you are taking this medicine over a prolonged period, carry an identification card with your name  and address, the type and dose of your medicine, and your doctor's name and address. This medicine may increase your risk of getting an infection. Stay away from people who are sick. Tell your doctor or health care professional if you are around anyone with measles or chickenpox. If you are going to have surgery, tell your doctor or health care professional that you have taken this medicine within the last twelve months. Ask your doctor or health care professional about your diet. You may need to lower the amount of salt you eat. The medicine can increase your blood sugar. If you are a diabetic check with your doctor if you need help adjusting the dose of your diabetic medicine. What side effects may I notice from receiving this medicine? Side effects that you should report to your doctor or health care professional as soon as possible: -allergic reactions like skin rash, itching or hives, swelling of the face, lips, or tongue -changes in vision -fever, sore throat, sneezing, cough, or other signs of infection, wounds that will not heal -increased thirst -mental depression, mood swings, mistaken feelings of self importance or of being mistreated -pain in hips, back, ribs, arms, shoulders, or legs -redness, blistering, peeling or loosening of the skin, including inside the mouth -trouble passing urine or change in the amount of urine -swelling of feet or lower legs -unusual bleeding or bruising Side effects that usually do not require medical attention (report to your doctor or health care professional if they continue or are bothersome): -headache -nausea, vomiting -skin problems, acne, thin and shiny skin -weight gain This list may not describe all possible side effects. Call your doctor for medical advice about side effects. You may report side effects to FDA at 1-800-FDA-1088. Where should I keep my medicine? Keep out of the reach of children. Store at room temperature between 20 and 25  degrees C (68 and 77 degrees F). Protect from light. Throw away any unused medicine after the expiration date. NOTE: This sheet is a summary. It may not cover all possible information. If you have questions about this medicine, talk to your doctor, pharmacist, or health care provider.  2014, [employer]. (2008-04-02 14:02:13) Dexamethasone injection What is this medicine? DEXAMETHASONE (dex a METH a sone) is a corticosteroid. It is used to treat inflammation of the skin, joints, lungs, and other organs. Common conditions treated include asthma, allergies, and arthritis. It is also used for other conditions, like blood disorders and diseases of the adrenal glands. This medicine may be used for other purposes; ask your health care provider or pharmacist if you have questions. COMMON BRAND NAME(S): Decadron, Solurex What should I tell my health care provider before I take this medicine? They need to know if you have any of these conditions: -blood clotting problems -Cushing's syndrome -diabetes -glaucoma -heart problems or disease -high blood pressure -infection like herpes, measles, tuberculosis, or chickenpox -kidney disease -liver disease -mental problems -myasthenia gravis -osteoporosis -previous heart attack -seizures -stomach, ulcer or intestine disease including colitis and diverticulitis -thyroid problem -an unusual or allergic reaction to dexamethasone, corticosteroids, other medicines, lactose, foods, dyes, or preservatives -pregnant or trying to get pregnant -breast-feeding How should  I use this medicine? This medicine is for injection into a muscle, joint, lesion, soft tissue, or vein. It is given by a health care professional in a hospital or clinic setting. Talk to your pediatrician regarding the use of this medicine in children. Special care may be needed. Overdosage: If you think you have taken too much of this medicine contact a poison control center or  emergency room at once. NOTE: This medicine is only for you. Do not share this medicine with others. What if I miss a dose? This may not apply. If you are having a series of injections over a prolonged period, try not to miss an appointment. Call your doctor or health care professional to reschedule if you are unable to keep an appointment. What may interact with this medicine? Do not take this medicine with any of the following medications: -mifepristone, RU-486 -vaccines This medicine may also interact with the following medications: -amphotericin B -antibiotics like clarithromycin, erythromycin, and troleandomycin -aspirin and aspirin-like drugs -barbiturates like phenobarbital -carbamazepine -cholestyramine -cholinesterase inhibitors like donepezil, galantamine, rivastigmine, and tacrine -cyclosporine -digoxin -diuretics -ephedrine -female hormones, like estrogens or progestins and birth control pills -indinavir -isoniazid -ketoconazole -medicines for diabetes -medicines that improve muscle tone or strength for conditions like myasthenia gravis -NSAIDs, medicines for pain and inflammation, like ibuprofen or naproxen -phenytoin -rifampin -thalidomide -warfarin This list may not describe all possible interactions. Give your health care provider a list of all the medicines, herbs, non-prescription drugs, or dietary supplements you use. Also tell them if you smoke, drink alcohol, or use illegal drugs. Some items may interact with your medicine. What should I watch for while using this medicine? Your condition will be monitored carefully while you are receiving this medicine. If you are taking this medicine for a long time, carry an identification card with your name and address, the type and dose of your medicine, and your doctor's name and address. This medicine may increase your risk of getting an infection. Stay away from people who are sick. Tell your doctor or health care  professional if you are around anyone with measles or chickenpox. Talk to your health care provider before you get any vaccines that you take this medicine. If you are going to have surgery, tell your doctor or health care professional that you have taken this medicine within the last twelve months. Ask your doctor or health care professional about your diet. You may need to lower the amount of salt you eat. The medicine can increase your blood sugar. If you are a diabetic check with your doctor if you need help adjusting the dose of your diabetic medicine. What side effects may I notice from receiving this medicine? Side effects that you should report to your doctor or health care professional as soon as possible: -allergic reactions like skin rash, itching or hives, swelling of the face, lips, or tongue -black or tarry stools -change in the amount of urine -changes in vision -confusion, excitement, restlessness, a false sense of well-being -fever, sore throat, sneezing, cough, or other signs of infection, wounds that will not heal -hallucinations -increased thirst -mental depression, mood swings, mistaken feelings of self importance or of being mistreated -pain in hips, back, ribs, arms, shoulders, or legs -pain, redness, or irritation at the injection site -redness, blistering, peeling or loosening of the skin, including inside the mouth -rounding out of face -swelling of feet or lower legs -unusual bleeding or bruising -unusual tired or weak -wounds that do not heal Side  effects that usually do not require medical attention (report to your doctor or health care professional if they continue or are bothersome): -diarrhea or constipation -change in taste -headache -nausea, vomiting -skin problems, acne, thin and shiny skin -touble sleeping -unusual growth of hair on the face or body -weight gain This list may not describe all possible side effects. Call your doctor for medical  advice about side effects. You may report side effects to FDA at 1-800-FDA-1088. Where should I keep my medicine? This drug is given in a hospital or clinic and will not be stored at home. NOTE: This sheet is a summary. It may not cover all possible information. If you have questions about this medicine, talk to your doctor, pharmacist, or health care provider.  2014, [employer]. (2008-04-02 14:04:12) Palonosetron Injection What is this medicine? PALONOSETRON (pal oh NOE se tron) is used to prevent nausea and vomiting caused by chemotherapy. It also helps prevent delayed nausea and vomiting that may occur a few days after your treatment. This medicine may be used for other purposes; ask your health care provider or pharmacist if you have questions. COMMON BRAND NAME(S): Aloxi What should I tell my health care provider before I take this medicine? They need to know if you have any of these conditions: -irregular heart rhythm -an unusual or allergic reaction to palonosetron, dolasetron, granisetron, ondansetron, other medicines, foods, dyes, or preservatives -pregnant or trying to get pregnant -breast-feeding How should I use this medicine? This medicine is for infusion into a vein. It is given by a health care professional in a hospital or clinic setting. Talk to your pediatrician regarding the use of this medicine in children. While this drug may be prescribed for children as young as 1 month for selected conditions, precautions do apply. Overdosage: If you think you have taken too much of this medicine contact a poison control center or emergency room at once. NOTE: This medicine is only for you. Do not share this medicine with others. What if I miss a dose? This does not apply. What may interact with this medicine? Do not take this medicine with any of the following  medications: -cisapride -dofetlide -dronedarone -droperidol -fluconazole -pimozide -posaconazole -thioridazine -ziprasidone This list may not describe all possible interactions. Give your health care provider a list of all the medicines, herbs, non-prescription drugs, or dietary supplements you use. Also tell them if you smoke, drink alcohol, or use illegal drugs. Some items may interact with your medicine. What should I watch for while using this medicine? Your condition will be monitored carefully while you are receiving this medicine. What side effects may I notice from receiving this medicine? Side effects that you should report to your doctor or health care professional as soon as possible: -allergic reactions like skin rash, itching or hives, swelling of the face, lips, or tongue -breathing problems -fast or irregular heartbeat -fever and chills -swelling of the hands and feet -tightness in the chest Side effects that usually do not require medical attention (report to your doctor or health care professional if they continue or are bothersome): -constipation or diarrhea -dizziness -headache This list may not describe all possible side effects. Call your doctor for medical advice about side effects. You may report side effects to FDA at 1-800-FDA-1088. Where should I keep my medicine? This drug is given in a hospital or clinic and will not be stored at home. NOTE: This sheet is a summary. It may not cover all possible information. If you  have questions about this medicine, talk to your doctor, pharmacist, or health care provider.  2014, [employer]. (2013-05-22 16:58:24) Fosaprepitant injection What is this medicine? FOSAPREPITANT (fos ap RE pi tant) is used together with other medicines to prevent nausea and vomiting caused by cancer treatment (chemotherapy). This medicine may be used for other purposes; ask your health care provider or pharmacist if you have  questions. COMMON BRAND NAME(S): Emend What should I tell my health care provider before I take this medicine? They need to know if you have any of these conditions: -liver disease -an unusual or allergic reaction to fosaprepitant, aprepitant, medicines, foods, dyes, or preservatives -pregnant or trying to get pregnant -breast-feeding How should I use this medicine? This medicine is for injection into a vein. It is given by a health care professional in a hospital or clinic setting. Talk to your pediatrician regarding the use of this medicine in children. Special care may be needed. Overdosage: If you think you have taken too much of this medicine contact a poison control center or emergency room at once. NOTE: This medicine is only for you. Do not share this medicine with others. What if I miss a dose? This does not apply. What may interact with this medicine? Do not take this medicine with any of these medicines: -cisapride -pimozide -ranolazine This medicine may also interact with the following medications: -diltiazem -female hormones, like estrogens or progestins and birth control pills -medicines for fungal infections like ketoconazole and itraconazole -medicines for HIV -medicines for seizures or to control epilepsy like carbamazepine or phenytoin -medicines used for sleep or anxiety disorders like alprazolam, diazepam, or midazolam -nefazodone -paroxetine -rifampin -some chemotherapy medications like etoposide, ifosfamide, vinblastine, vincristine -some antibiotics like clarithromycin, erythromycin, troleandomycin -steroid medicines like dexamethasone or methylprednisolone -tolbutamide -warfarin This list may not describe all possible interactions. Give your health care provider a list of all the medicines, herbs, non-prescription drugs, or dietary supplements you use. Also tell them if you smoke, drink alcohol, or use illegal drugs. Some items may interact with your  medicine. What should I watch for while using this medicine? Do not take this medicine if you already have nausea and vomiting. Ask your health care provider what to do if you already have nausea. Birth control pills may not work properly while you are taking this medicine. Talk to your doctor about using an extra method of birth control. This medicine should not be used continuously for a long time. Visit your doctor or health care professional for regular check-ups. This medicine may change your liver function blood test results. What side effects may I notice from receiving this medicine? Side effects that you should report to your doctor or health care professional as soon as possible: -allergic reactions like skin rash, itching or hives, swelling of the face, lips, or tongue -breathing problems -changes in heart rhythm -high or low blood pressure -pain, redness, or irritation at site where injected -rectal bleeding -serious dizziness or disorientation, confusion -sharp or severe stomach pain -sharp pain in your leg Side effects that usually do not require medical attention (report to your doctor or health care professional if they continue or are bothersome): -constipation or diarrhea -hair loss -headache -hiccups -loss of appetite -nausea -upset stomach -tiredness This list may not describe all possible side effects. Call your doctor for medical advice about side effects. You may report side effects to FDA at 1-800-FDA-1088. Where should I keep my medicine? This drug is given in a hospital or  clinic and will not be stored at home. NOTE: This sheet is a summary. It may not cover all possible information. If you have questions about this medicine, talk to your doctor, pharmacist, or health care provider.  2014, Elsevier/Gold Standard. (2009-11-23 12:46:13) Metoclopramide tablets What is this medicine? METOCLOPRAMIDE (met oh kloe PRA mide) is used to treat the symptoms of  gastroesophageal reflux disease (GERD) like heartburn. It is also used to treat people with slow emptying of the stomach and intestinal tract. This medicine may be used for other purposes; ask your health care provider or pharmacist if you have questions. COMMON BRAND NAME(S): Reglan What should I tell my health care provider before I take this medicine? They need to know if you have any of these conditions: -breast cancer -depression -diabetes -heart failure -high blood pressure -kidney disease -liver disease -Parkinson's disease or a movement disorder -pheochromocytoma -seizures -stomach obstruction, bleeding, or perforation -an unusual or allergic reaction to metoclopramide, procainamide, sulfites, other medicines, foods, dyes, or preservatives -pregnant or trying to get pregnant -breast-feeding How should I use this medicine? Take this medicine by mouth with a glass of water. Follow the directions on the prescription label. Take this medicine on an empty stomach, about 30 minutes before eating. Take your doses at regular intervals. Do not take your medicine more often than directed. Do not stop taking except on the advice of your doctor or health care professional. A special MedGuide will be given to you by the pharmacist with each prescription and refill. Be sure to read this information carefully each time. Talk to your pediatrician regarding the use of this medicine in children. Special care may be needed. Overdosage: If you think you have taken too much of this medicine contact a poison control center or emergency room at once. NOTE: This medicine is only for you. Do not share this medicine with others. What if I miss a dose? If you miss a dose, take it as soon as you can. If it is almost time for your next dose, take only that dose. Do not take double or extra doses. What may interact with this medicine? -acetaminophen -cyclosporine -digoxin -medicines for blood  pressure -medicines for diabetes, including insulin -medicines for hay fever and other allergies -medicines for depression, especially an Monoamine Oxidase Inhibitor (MAOI) -medicines for Parkinson's disease, like levodopa -medicines for sleep or for pain -tetracycline This list may not describe all possible interactions. Give your health care provider a list of all the medicines, herbs, non-prescription drugs, or dietary supplements you use. Also tell them if you smoke, drink alcohol, or use illegal drugs. Some items may interact with your medicine. What should I watch for while using this medicine? It may take a few weeks for your stomach condition to start to get better. However, do not take this medicine for longer than 12 weeks. The longer you take this medicine, and the more you take it, the greater your chances are of developing serious side effects. If you are an elderly patient, a female patient, or you have diabetes, you may be at an increased risk for side effects from this medicine. Contact your doctor immediately if you start having movements you cannot control such as lip smacking, rapid movements of the tongue, involuntary or uncontrollable movements of the eyes, head, arms and legs, or muscle twitches and spasms. Patients and their families should watch out for worsening depression or thoughts of suicide. Also watch out for any sudden or severe changes in feelings  such as feeling anxious, agitated, panicky, irritable, hostile, aggressive, impulsive, severely restless, overly excited and hyperactive, or not being able to sleep. If this happens, especially at the beginning of treatment or after a change in dose, call your doctor. Do not treat yourself for high fever. Ask your doctor or health care professional for advice. You may get drowsy or dizzy. Do not drive, use machinery, or do anything that needs mental alertness until you know how this drug affects you. Do not stand or sit up  quickly, especially if you are an older patient. This reduces the risk of dizzy or fainting spells. Alcohol can make you more drowsy and dizzy. Avoid alcoholic drinks. What side effects may I notice from receiving this medicine? Side effects that you should report to your doctor or health care professional as soon as possible: -allergic reactions like skin rash, itching or hives, swelling of the face, lips, or tongue -abnormal production of milk in females -breast enlargement in both males and females -change in the way you walk -difficulty moving, speaking or swallowing -drooling, lip smacking, or rapid movements of the tongue -excessive sweating -fever -involuntary or uncontrollable movements of the eyes, head, arms and legs -irregular heartbeat or palpitations -muscle twitches and spasms -unusually weak or tired Side effects that usually do not require medical attention (report to your doctor or health care professional if they continue or are bothersome): -change in sex drive or performance -depressed mood -diarrhea -difficulty sleeping -headache -menstrual changes -restless or nervous This list may not describe all possible side effects. Call your doctor for medical advice about side effects. You may report side effects to FDA at 1-800-FDA-1088. Where should I keep my medicine? Keep out of the reach of children. Store at room temperature between 20 and 25 degrees C (68 and 77 degrees F). Protect from light. Keep container tightly closed. Throw away any unused medicine after the expiration date. NOTE: This sheet is a summary. It may not cover all possible information. If you have questions about this medicine, talk to your doctor, pharmacist, or health care provider.  2014, Elsevier/Gold Standard. (2012-04-09 13:04:38) Prochlorperazine tablets What is this medicine? PROCHLORPERAZINE (proe klor PER a zeen) helps to control severe nausea and vomiting. This medicine is also used to  treat schizophrenia. It can also help patients who experience anxiety that is not due to psychological illness. This medicine may be used for other purposes; ask your health care provider or pharmacist if you have questions. COMMON BRAND NAME(S): Compazine What should I tell my health care provider before I take this medicine? They need to know if you have any of these conditions: -blood disorders or disease -dementia -liver disease or jaundice -Parkinson's disease -uncontrollable movement disorder -an unusual or allergic reaction to prochlorperazine, other medicines, foods, dyes, or preservatives -pregnant or trying to get pregnant -breast-feeding How should I use this medicine? Take this medicine by mouth with a glass of water. Follow the directions on the prescription label. Take your doses at regular intervals. Do not take your medicine more often than directed. Do not stop taking this medicine suddenly. This can cause nausea, vomiting, and dizziness. Ask your doctor or health care professional for advice. Talk to your pediatrician regarding the use of this medicine in children. Special care may be needed. While this drug may be prescribed for children as young as 2 years for selected conditions, precautions do apply. Overdosage: If you think you have taken too much of this medicine contact a poison  control center or emergency room at once. NOTE: This medicine is only for you. Do not share this medicine with others. What if I miss a dose? If you miss a dose, take it as soon as you can. If it is almost time for your next dose, take only that dose. Do not take double or extra doses. What may interact with this medicine? Do not take this medicine with any of the following medications: -amoxapine -antidepressants like citalopram, escitalopram, fluoxetine, paroxetine, and sertraline -deferoxamine -dofetilide -maprotiline -tricyclic antidepressants like amitriptyline, clomipramine,  imipramine, nortiptyline and others This medicine may also interact with the following medications: -lithium -medicines for pain -phenytoin -propranolol -warfarin This list may not describe all possible interactions. Give your health care provider a list of all the medicines, herbs, non-prescription drugs, or dietary supplements you use. Also tell them if you smoke, drink alcohol, or use illegal drugs. Some items may interact with your medicine. What should I watch for while using this medicine? Visit your doctor or health care professional for regular checks on your progress. You may get drowsy or dizzy. Do not drive, use machinery, or do anything that needs mental alertness until you know how this medicine affects you. Do not stand or sit up quickly, especially if you are an older patient. This reduces the risk of dizzy or fainting spells. Alcohol may interfere with the effect of this medicine. Avoid alcoholic drinks. This medicine can reduce the response of your body to heat or cold. Dress warm in cold weather and stay hydrated in hot weather. If possible, avoid extreme temperatures like saunas, hot tubs, very hot or cold showers, or activities that can cause dehydration such as vigorous exercise. This medicine can make you more sensitive to the sun. Keep out of the sun. If you cannot avoid being in the sun, wear protective clothing and use sunscreen. Do not use sun lamps or tanning beds/booths. Your mouth may get dry. Chewing sugarless gum or sucking hard candy, and drinking plenty of water may help. Contact your doctor if the problem does not go away or is severe. What side effects may I notice from receiving this medicine? Side effects that you should report to your doctor or health care professional as soon as possible: -blurred vision -breast enlargement in men or women -breast milk in women who are not breast-feeding -chest pain, fast or irregular heartbeat -confusion,  restlessness -dark yellow or brown urine -difficulty breathing or swallowing -dizziness or fainting spells -drooling, shaking, movement difficulty (shuffling walk) or rigidity -fever, chills, sore throat -involuntary or uncontrollable movements of the eyes, mouth, head, arms, and legs -seizures -stomach area pain -unusually weak or tired -unusual bleeding or bruising -yellowing of skin or eyes Side effects that usually do not require medical attention (report to your doctor or health care professional if they continue or are bothersome): -difficulty passing urine -difficulty sleeping -headache -sexual dysfunction -skin rash, or itching This list may not describe all possible side effects. Call your doctor for medical advice about side effects. You may report side effects to FDA at 1-800-FDA-1088. Where should I keep my medicine? Keep out of the reach of children. Store at room temperature between 15 and 30 degrees C (59 and 86 degrees F). Protect from light. Throw away any unused medicine after the expiration date. NOTE: This sheet is a summary. It may not cover all possible information. If you have questions about this medicine, talk to your doctor, pharmacist, or health care provider.  2014, Elsevier/Gold  Standard. (2012-04-30 16:59:39) Lidocaine; Prilocaine cream What is this medicine? LIDOCAINE; PRILOCAINE (LYE doe kane; PRIL oh kane) is a topical anesthetic that causes loss of feeling in the skin and surrounding tissues. It is used to numb the skin before procedures or injections. This medicine may be used for other purposes; ask your health care provider or pharmacist if you have questions. COMMON BRAND NAME(S): EMLA What should I tell my health care provider before I take this medicine? They need to know if you have any of these conditions: -glucose-6-phosphate deficiencies -heart disease -kidney or liver disease -methemoglobinemia -an unusual or allergic reaction to  lidocaine, prilocaine, other medicines, foods, dyes, or preservatives -pregnant or trying to get pregnant -breast-feeding How should I use this medicine? This medicine is for external use only on the skin. Do not take by mouth. Follow the directions on the prescription label. Wash hands before and after use. Do not use more or leave in contact with the skin longer than directed. Do not apply to eyes or open wounds. It can cause irritation and blurred or temporary loss of vision. If this medicine comes in contact with your eyes, immediately rinse the eye with water. Do not touch or rub the eye. Contact your health care provider right away. Talk to your pediatrician regarding the use of this medicine in children. While this medicine may be prescribed for children for selected conditions, precautions do apply. Overdosage: If you think you have taken too much of this medicine contact a poison control center or emergency room at once. NOTE: This medicine is only for you. Do not share this medicine with others. What if I miss a dose? This medicine is usually only applied once prior to each procedure. It must be in contact with the skin for a period of time for it to work. If you applied this medicine later than directed, tell your health care professional before starting the procedure. What may interact with this medicine? -acetaminophen -chloroquine -dapsone -medicines to control heart rhythm -nitrates like nitroglycerin and nitroprusside -other ointments, creams, or sprays that may contain anesthetic medicine -phenobarbital -phenytoin -quinine -sulfonamides like sulfacetamide, sulfamethoxazole, sulfasalazine and others This list may not describe all possible interactions. Give your health care provider a list of all the medicines, herbs, non-prescription drugs, or dietary supplements you use. Also tell them if you smoke, drink alcohol, or use illegal drugs. Some items may interact with your  medicine. What should I watch for while using this medicine? Be careful to avoid injury to the treated area while it is numb and you are not aware of pain. Avoid scratching, rubbing, or exposing the treated area to hot or cold temperatures until complete sensation has returned. The numb feeling will wear off a few hours after applying the cream. What side effects may I notice from receiving this medicine? Side effects that you should report to your doctor or health care professional as soon as possible: -blurred vision -chest pain -difficulty breathing -dizziness -drowsiness -fast or irregular heartbeat -skin rash or itching -swelling of your throat, lips, or face -trembling Side effects that usually do not require medical attention (report to your doctor or health care professional if they continue or are bothersome): -changes in ability to feel hot or cold -redness and swelling at the application site This list may not describe all possible side effects. Call your doctor for medical advice about side effects. You may report side effects to FDA at 1-800-FDA-1088. Where should I keep my medicine? Keep  out of reach of children. Store at room temperature between 15 and 30 degrees C (59 and 86 degrees F). Keep container tightly closed. Throw away any unused medicine after the expiration date. NOTE: This sheet is a summary. It may not cover all possible information. If you have questions about this medicine, talk to your doctor, pharmacist, or health care provider.  2014, Elsevier/Gold Standard. (2008-06-15 17:14:35) MUGA Testing The medical term for the MUGA test is Electrocardiographic Multi-Gated Acquisition Study with First Pass Radionuclide Angiography (MUGA). MEANING OF TEST  The purpose of this test to check on how your heart's ventricles are working. It does this by looking at the muscle motion of the heart muscle while it is beating. The ventricles are the large muscular chambers in  the lower part of your heart that pump blood to your lungs (right ventricle) and to the rest of your body (left ventricle). The test also checks the ejection fraction of the heart. This is the amount of blood that is being pushed out by the heart. The purpose of a MUGA study is to evaluate right and left ventricular wall motion and ejection fraction. There is no preparation for this test. NORMAL FINDINGS  Normal myocardial ejection fraction and coronary perfusion. Ranges for normal findings may vary among different laboratories and hospitals. You should always check with your doctor after having lab work or other tests done to discuss the meaning of your test results and whether your values are considered within normal limits. OBTAINING THE TEST RESULTS It is your responsibility to obtain your test results. Ask the lab or department performing the test when and how you will get your results. Document Released: 01/30/2006 Document Revised: 03/04/2012 Document Reviewed: 03/28/2007 [facility] Patient Information 2014 [place], [place].

## 2013-12-23 ENCOUNTER — Encounter (HOSPITAL_BASED_OUTPATIENT_CLINIC_OR_DEPARTMENT_OTHER): Payer: BC Managed Care – PPO

## 2013-12-23 ENCOUNTER — Encounter (HOSPITAL_COMMUNITY): Payer: Self-pay

## 2013-12-23 ENCOUNTER — Encounter (HOSPITAL_COMMUNITY)
Admission: RE | Admit: 2013-12-23 | Discharge: 2013-12-23 | Disposition: A | Discharge status: Home / Self Care | Payer: BC Managed Care – PPO | Source: Ambulatory Visit | Attending: Hematology and Oncology | Admitting: Hematology and Oncology

## 2013-12-23 ENCOUNTER — Other Ambulatory Visit (HOSPITAL_COMMUNITY): Payer: Self-pay | Admitting: *Deleted

## 2013-12-23 DIAGNOSIS — C50912 Malignant neoplasm of unspecified site of left female breast: Secondary | ICD-10-CM

## 2013-12-23 DIAGNOSIS — Z01810 Encounter for preprocedural cardiovascular examination: Secondary | ICD-10-CM | POA: Insufficient documentation

## 2013-12-23 DIAGNOSIS — C50919 Malignant neoplasm of unspecified site of unspecified female breast: Secondary | ICD-10-CM | POA: Insufficient documentation

## 2013-12-23 MED ORDER — TECHNETIUM TC 99M-LABELED RED BLOOD CELLS IV KIT
25.0000 | PACK | Freq: Once | INTRAVENOUS | Status: AC | PRN
  Administered 2013-12-23: 25 via INTRAVENOUS

## 2013-12-23 MED ORDER — HEPARIN SOD (PORK) LOCK FLUSH 100 UNIT/ML IV SOLN
INTRAVENOUS | Status: AC
  Filled 2013-12-23: qty 5

## 2013-12-23 NOTE — Progress Notes (Signed)
Chemo teaching done and consent signed for Adriamycin, Cytoxan, and Taxotere. Med/chemo calendar given to patient.

## 2013-12-29 ENCOUNTER — Ambulatory Visit (HOSPITAL_COMMUNITY): Payer: BC Managed Care – PPO

## 2013-12-29 ENCOUNTER — Encounter (HOSPITAL_COMMUNITY): Payer: BC Managed Care – PPO | Admitting: Anesthesiology

## 2013-12-29 ENCOUNTER — Ambulatory Visit (HOSPITAL_COMMUNITY)
Admission: RE | Admit: 2013-12-29 | Discharge: 2013-12-29 | Disposition: A | Discharge status: Home / Self Care | Payer: BC Managed Care – PPO | Source: Ambulatory Visit | Attending: General Surgery | Admitting: General Surgery

## 2013-12-29 ENCOUNTER — Encounter (HOSPITAL_COMMUNITY)
Admission: RE | Disposition: A | Discharge status: Home / Self Care | Payer: Self-pay | Source: Ambulatory Visit | Attending: General Surgery

## 2013-12-29 ENCOUNTER — Encounter (HOSPITAL_COMMUNITY): Payer: Self-pay | Admitting: *Deleted

## 2013-12-29 ENCOUNTER — Ambulatory Visit (HOSPITAL_COMMUNITY): Payer: BC Managed Care – PPO | Admitting: Anesthesiology

## 2013-12-29 DIAGNOSIS — I10 Essential (primary) hypertension: Secondary | ICD-10-CM | POA: Insufficient documentation

## 2013-12-29 DIAGNOSIS — Z01812 Encounter for preprocedural laboratory examination: Secondary | ICD-10-CM | POA: Insufficient documentation

## 2013-12-29 DIAGNOSIS — E119 Type 2 diabetes mellitus without complications: Secondary | ICD-10-CM | POA: Insufficient documentation

## 2013-12-29 DIAGNOSIS — C50919 Malignant neoplasm of unspecified site of unspecified female breast: Secondary | ICD-10-CM | POA: Insufficient documentation

## 2013-12-29 HISTORY — PX: PORTACATH PLACEMENT: SHX2246

## 2013-12-29 LAB — GLUCOSE, CAPILLARY
Glucose-Capillary: 97 mg/dL (ref 70–99)
Glucose-Capillary: 96 mg/dL (ref 70–99)

## 2013-12-29 SURGERY — INSERTION, TUNNELED CENTRAL VENOUS DEVICE, WITH PORT
Anesthesia: Monitor Anesthesia Care | Site: Chest | Laterality: Right

## 2013-12-29 MED ORDER — MIDAZOLAM HCL 2 MG/2ML IJ SOLN
1.0000 mg | INTRAMUSCULAR | Status: DC | PRN
  Administered 2013-12-29: 2 mg via INTRAVENOUS

## 2013-12-29 MED ORDER — HYDROCODONE-ACETAMINOPHEN 5-325 MG PO TABS: 1.0000 | ORAL_TABLET | ORAL | Status: DC | PRN

## 2013-12-29 MED ORDER — CHLORHEXIDINE GLUCONATE 4 % EX LIQD: 1.0000 "application " | Freq: Once | CUTANEOUS | Status: DC

## 2013-12-29 MED ORDER — PROPOFOL 10 MG/ML IV BOLUS
INTRAVENOUS | Status: AC
  Filled 2013-12-29: qty 20

## 2013-12-29 MED ORDER — FENTANYL CITRATE 0.05 MG/ML IJ SOLN
INTRAMUSCULAR | Status: DC | PRN
Start: 2013-12-29 — End: 2013-12-29
  Administered 2013-12-29 (×2): 25 ug via INTRAVENOUS

## 2013-12-29 MED ORDER — SODIUM CHLORIDE 0.9 % IJ SOLN
INTRAMUSCULAR | Status: AC
  Filled 2013-12-29: qty 10

## 2013-12-29 MED ORDER — HEPARIN SOD (PORK) LOCK FLUSH 100 UNIT/ML IV SOLN
INTRAVENOUS | Status: DC | PRN
  Administered 2013-12-29: 500 [IU] via INTRAVENOUS

## 2013-12-29 MED ORDER — GLYCOPYRROLATE 0.2 MG/ML IJ SOLN
0.2000 mg | Freq: Once | INTRAMUSCULAR | Status: AC
  Administered 2013-12-29: 0.2 mg via INTRAVENOUS
  Filled 2013-12-29: qty 1

## 2013-12-29 MED ORDER — KETOROLAC TROMETHAMINE 30 MG/ML IJ SOLN: 30.0000 mg | Freq: Once | INTRAMUSCULAR | Status: DC

## 2013-12-29 MED ORDER — FENTANYL CITRATE 0.05 MG/ML IJ SOLN: 25.0000 ug | INTRAMUSCULAR | Status: DC | PRN

## 2013-12-29 MED ORDER — SODIUM CHLORIDE 0.9 % IV SOLN
INTRAVENOUS | Status: DC | PRN
  Administered 2013-12-29: 500 mL

## 2013-12-29 MED ORDER — CEFAZOLIN SODIUM-DEXTROSE 2-3 GM-% IV SOLR
INTRAVENOUS | Status: AC
  Filled 2013-12-29: qty 50

## 2013-12-29 MED ORDER — PROPOFOL INFUSION 10 MG/ML OPTIME
INTRAVENOUS | Status: DC | PRN
  Administered 2013-12-29: 50 ug/kg/min via INTRAVENOUS

## 2013-12-29 MED ORDER — LIDOCAINE HCL (PF) 1 % IJ SOLN
INTRAMUSCULAR | Status: AC
  Filled 2013-12-29: qty 5

## 2013-12-29 MED ORDER — FENTANYL CITRATE 0.05 MG/ML IJ SOLN
25.0000 ug | INTRAMUSCULAR | Status: AC
Start: 2013-12-29 — End: 2013-12-29
  Administered 2013-12-29: 25 ug via INTRAVENOUS
  Administered 2013-12-29: 10:00:00 via INTRAVENOUS
  Filled 2013-12-29: qty 2

## 2013-12-29 MED ORDER — LIDOCAINE HCL (PF) 1 % IJ SOLN
INTRAMUSCULAR | Status: AC
  Filled 2013-12-29: qty 30

## 2013-12-29 MED ORDER — LACTATED RINGERS IV SOLN
INTRAVENOUS | Status: DC
  Administered 2013-12-29: 09:00:00 via INTRAVENOUS

## 2013-12-29 MED ORDER — HEPARIN SOD (PORK) LOCK FLUSH 100 UNIT/ML IV SOLN
INTRAVENOUS | Status: AC
  Filled 2013-12-29: qty 5

## 2013-12-29 MED ORDER — CEFAZOLIN SODIUM-DEXTROSE 2-3 GM-% IV SOLR
2.0000 g | INTRAVENOUS | Status: AC
Start: 2013-12-29 — End: 2013-12-29
  Administered 2013-12-29: 2 g via INTRAVENOUS

## 2013-12-29 MED ORDER — MIDAZOLAM HCL 2 MG/2ML IJ SOLN
INTRAMUSCULAR | Status: AC
  Filled 2013-12-29: qty 2

## 2013-12-29 MED ORDER — ONDANSETRON HCL 4 MG/2ML IJ SOLN: 4.0000 mg | Freq: Once | INTRAMUSCULAR | Status: DC | PRN

## 2013-12-29 MED ORDER — LIDOCAINE HCL (PF) 1 % IJ SOLN
INTRAMUSCULAR | Status: DC | PRN
  Administered 2013-12-29: 7 mL

## 2013-12-29 SURGICAL SUPPLY — 37 items
APPLIER CLIP 9.375 SM OPEN (CLIP)
BAG DECANTER FOR FLEXI CONT (MISCELLANEOUS) ×3 IMPLANT
BAG HAMPER (MISCELLANEOUS) ×3 IMPLANT
CATH HICKMAN DUAL 12.0 (CATHETERS) IMPLANT
CLIP APPLIE 9.375 SM OPEN (CLIP) IMPLANT
CLOTH BEACON ORANGE TIMEOUT ST (SAFETY) ×3 IMPLANT
COVER LIGHT HANDLE STERIS (MISCELLANEOUS) ×6 IMPLANT
DECANTER SPIKE VIAL GLASS SM (MISCELLANEOUS) ×3 IMPLANT
DERMABOND ADVANCED (GAUZE/BANDAGES/DRESSINGS) ×2
DERMABOND ADVANCED .7 DNX12 (GAUZE/BANDAGES/DRESSINGS) ×1 IMPLANT
DRAPE C-ARM FOLDED MOBILE STRL (DRAPES) ×3 IMPLANT
DURAPREP 6ML APPLICATOR 50/CS (WOUND CARE) ×3 IMPLANT
ELECT REM PT RETURN 9FT ADLT (ELECTROSURGICAL) ×3
ELECTRODE REM PT RTRN 9FT ADLT (ELECTROSURGICAL) ×1 IMPLANT
GLOVE BIO SURGEON STRL SZ7.5 (GLOVE) ×3 IMPLANT
GLOVE BIOGEL PI IND STRL 7.0 (GLOVE) ×1 IMPLANT
GLOVE BIOGEL PI INDICATOR 7.0 (GLOVE) ×2
GLOVE ECLIPSE 6.5 STRL STRAW (GLOVE) ×3 IMPLANT
GOWN STRL REIN XL XLG (GOWN DISPOSABLE) ×6 IMPLANT
IV NS 500ML (IV SOLUTION) ×2
IV NS 500ML BAXH (IV SOLUTION) ×1 IMPLANT
KIT PORT POWER 8FR ISP MRI (CATHETERS) ×3 IMPLANT
KIT ROOM TURNOVER APOR (KITS) ×3 IMPLANT
MANIFOLD NEPTUNE II (INSTRUMENTS) ×3 IMPLANT
NEEDLE HYPO 25X1 1.5 SAFETY (NEEDLE) ×3 IMPLANT
PACK MINOR (CUSTOM PROCEDURE TRAY) ×3 IMPLANT
PAD ARMBOARD 7.5X6 YLW CONV (MISCELLANEOUS) ×3 IMPLANT
SET BASIN LINEN APH (SET/KITS/TRAYS/PACK) ×3 IMPLANT
SET INTRODUCER 12FR PACEMAKER (SHEATH) IMPLANT
SHEATH COOK PEEL AWAY SET 8F (SHEATH) IMPLANT
SUT PROLENE 3 0 PS 2 (SUTURE) IMPLANT
SUT VIC AB 3-0 SH 27 (SUTURE) ×2
SUT VIC AB 3-0 SH 27X BRD (SUTURE) ×1 IMPLANT
SUT VIC AB 4-0 PS2 27 (SUTURE) ×3 IMPLANT
SYR 20CC LL (SYRINGE) ×3 IMPLANT
SYR CONTROL 10ML LL (SYRINGE) ×3 IMPLANT
SYRINGE 10CC LL (SYRINGE) ×3 IMPLANT

## 2013-12-29 NOTE — OR Nursing (Signed)
Fentynl   50 mcg given to Drucie Opitz to take to OR

## 2013-12-29 NOTE — Interval H&P Note (Signed)
History and Physical Interval Note:  12/29/2013 9:41 AM  Sally [name]  has presented today for surgery, with the diagnosis of breast cancer  The various methods of treatment have been discussed with the patient and family. After consideration of risks, benefits and other options for treatment, the patient has consented to  Procedure(s): INSERTION PORT-A-CATH (N/A) as a surgical intervention .  The patient's history has been reviewed, patient examined, no change in status, stable for surgery.  I have reviewed the patient's chart and labs.  Questions were answered to the patient's satisfaction.     Aviva Signs A

## 2013-12-29 NOTE — Progress Notes (Signed)
Continue waiting on CXR to be done.

## 2013-12-29 NOTE — Progress Notes (Signed)
Ginger-ale given to drink. Tolerated well.

## 2013-12-29 NOTE — Anesthesia Postprocedure Evaluation (Signed)
Anesthesia Post Note  Patient: Sally [name]  Procedure(s) Performed: Procedure(s) (LRB): INSERTION PORT-A-CATH (N/A)  Anesthesia type: MAC  Patient location: PACU  Post pain: Pain level controlled  Post assessment: Post-op Vital signs reviewed, Patient's Cardiovascular Status Stable, Respiratory Function Stable, Patent Airway, No signs of Nausea or vomiting and Pain level controlled  Last Vitals:  Filed Vitals:   12/29/13 1041  BP: 110/72  Pulse: 67  Temp: 36.5 C  Resp: 20    Post vital signs: Reviewed and stable  Level of consciousness: awake and alert   Complications: No apparent anesthesia complications

## 2013-12-29 NOTE — Op Note (Signed)
Patient:  Sally [name]  DOB:  September 04, 1956  MRN:  [id number]   Preop Diagnosis:  Left breast carcinoma, need for central venous access  Postop Diagnosis:  Same  Procedure:  Port-A-Cath insertion  Surgeon:  Aviva Signs, M.D.  Anes:  MAC  Indications:  Patient is a 58 year old black female was recently diagnosed with left breast carcinoma is about to undergo chemotherapy. The risks and benefits of the procedure including bleeding and pneumothorax were fully explained to the patient, who gave informed consent.  Procedure note:  The patient was placed in the Trendelenburg position after the right upper chest was prepped and draped using usual sterile technique with DuraPrep. Surgical site confirmation was performed. 1% Xylocaine was used for local anesthesia.  An incision was made below the right clavicle. A subcutaneous pocket was then formed. A needle is advanced in total right subclavian vein using the Seldinger technique without difficulty. A guidewire was then introduced into the right atrium under fluoroscopic guidance. An introducer peel-away sheath were placed over the guidewire. The peel-away sheath was removed and the catheter attached to the port. The port was placed in its subcutaneous pocket. Adequate positioning was confirmed by fluoroscopy. Good backflow was noted in the port. The port was flushed with heparin flush. The subcutaneous layer was reapproximated using 3-0 Vicryl interrupted suture. The skin was closed using a 4-0 Vicryl subcuticular suture. Dermabond was then applied.  All tape and needle counts were correct at the end of the procedure. Patient was transferred to PACU in stable condition. A chest x-ray will be performed at that time.  Complications:  None  EBL:  Minimal  Specimen:  None

## 2013-12-29 NOTE — Progress Notes (Signed)
CXR done. Tolerated well.

## 2013-12-29 NOTE — Transfer of Care (Signed)
Immediate Anesthesia Transfer of Care Note  Patient: Sally [name]  Procedure(s) Performed: Procedure(s) (LRB): INSERTION PORT-A-CATH (N/A)  Patient Location: PACU  Anesthesia Type: MAC  Level of Consciousness: awake  Airway & Oxygen Therapy: Patient Spontanous Breathing. Nasal cannula  Post-op Assessment: Report given to PACU RN, Post -op Vital signs reviewed and stable and Patient moving all extremities  Post vital signs: Reviewed and stable  Complications: No apparent anesthesia complications

## 2013-12-29 NOTE — Anesthesia Preprocedure Evaluation (Addendum)
Anesthesia Evaluation  Patient identified by MRN, date of birth, ID band Patient awake    Reviewed: Allergy & Precautions, H&P , NPO status , Patient's Chart, lab work & pertinent test results  Airway Mallampati: III TM Distance: >3 FB     Dental  (+) Partial Upper and Partial Lower   Pulmonary Current Smoker,  breath sounds clear to auscultation        Cardiovascular hypertension, Pt. on medications Rhythm:Regular Rate:Normal     Neuro/Psych    GI/Hepatic   Endo/Other    Renal/GU      Musculoskeletal   Abdominal   Peds  Hematology   Anesthesia Other Findings   Reproductive/Obstetrics                          Anesthesia Physical Anesthesia Plan  ASA: II  Anesthesia Plan: MAC   Post-op Pain Management:    Induction: Intravenous  Airway Management Planned: Nasal Cannula  Additional Equipment:   Intra-op Plan:   Post-operative Plan:   Informed Consent: I have reviewed the patients History and Physical, chart, labs and discussed the procedure including the risks, benefits and alternatives for the proposed anesthesia with the patient or authorized representative who has indicated his/her understanding and acceptance.     Plan Discussed with:   Anesthesia Plan Comments:         Anesthesia Quick Evaluation                                  Anesthesia Evaluation  Patient identified by MRN, date of birth, ID band Patient awake    Reviewed: Allergy & Precautions, H&P , NPO status , Patient's Chart, lab work & pertinent test results  Airway Mallampati: III TM Distance: >3 FB Neck ROM: Full    Dental  (+) Partial Lower and Partial Upper   Pulmonary Current Smoker,  breath sounds clear to auscultation        Cardiovascular hypertension, Pt. on medications Rhythm:Regular Rate:Normal     Neuro/Psych    GI/Hepatic negative GI ROS,   Endo/Other  diabetes,  Well Controlled, Type 2  Renal/GU      Musculoskeletal   Abdominal   Peds  Hematology   Anesthesia Other Findings   Reproductive/Obstetrics                           Anesthesia Physical Anesthesia Plan  ASA: II  Anesthesia Plan: General   Post-op Pain Management:    Induction: Intravenous  Airway Management Planned: Oral ETT and Video Laryngoscope Planned  Additional Equipment:   Intra-op Plan:   Post-operative Plan: Extubation in OR  Informed Consent: I have reviewed the patients History and Physical, chart, labs and discussed the procedure including the risks, benefits and alternatives for the proposed anesthesia with the patient or authorized representative who has indicated his/her understanding and acceptance.     Plan Discussed with:   Anesthesia Plan Comments:         Anesthesia Quick Evaluation                                   Anesthesia Evaluation  Patient identified by MRN, date of birth, ID band Patient awake    Reviewed: Allergy & Precautions, H&P , NPO status , Patient's Chart,  lab work & pertinent test results  Airway Mallampati: III TM Distance: >3 FB Neck ROM: Full    Dental  (+) Partial Lower and Partial Upper   Pulmonary Current Smoker,  breath sounds clear to auscultation        Cardiovascular hypertension, Pt. on medications Rhythm:Regular Rate:Normal     Neuro/Psych    GI/Hepatic negative GI ROS,   Endo/Other  diabetes, Well Controlled, Type 2  Renal/GU      Musculoskeletal   Abdominal   Peds  Hematology   Anesthesia Other Findings   Reproductive/Obstetrics                           Anesthesia Physical Anesthesia Plan  ASA: II  Anesthesia Plan: General   Post-op Pain Management:    Induction: Intravenous  Airway Management Planned: Oral ETT and Video Laryngoscope Planned  Additional Equipment:   Intra-op Plan:   Post-operative Plan:  Extubation in OR  Informed Consent: I have reviewed the patients History and Physical, chart, labs and discussed the procedure including the risks, benefits and alternatives for the proposed anesthesia with the patient or authorized representative who has indicated his/her understanding and acceptance.     Plan Discussed with:   Anesthesia Plan Comments:         Anesthesia Quick Evaluation

## 2013-12-29 NOTE — Discharge Instructions (Signed)
Implanted Port Instructions  An implanted port is a central line that has a round shape and is placed under the skin. It is used for long-term IV (intravenous) access for:  · Medicine.  · Fluids.  · Liquid nutrition, such as TPN (total parenteral nutrition).  · Blood samples.  Ports can be placed:  · In the chest area just below the collarbone (this is the most common place.)  · In the arms.  · In the belly (abdomen) area.  · In the legs.  PARTS OF THE PORT  A port has 2 main parts:  · The reservoir. The reservoir is round, disc-shaped, and will be a small, raised area under your skin.  · The reservoir is the part where a needle is inserted (accessed) to either give medicines or to draw blood.  · The catheter. The catheter is a long, slender tube that extends from the reservoir. The catheter is placed into a large vein.  · Medicine that is inserted into the reservoir goes into the catheter and then into the vein.  INSERTION OF THE PORT  · The port is surgically placed in either an operating room or in a procedural area (interventional radiology).  · Medicine may be given to help you relax during the procedure.  · The skin where the port will be inserted is numbed (local anesthetic).  · 1 or 2 small cuts (incisions) will be made in the skin to insert the port.  · The port can be used after it has been inserted.  INCISION SITE CARE  · The incision site may have small adhesive strips on it. This helps keep the incision site closed. Sometimes, no adhesive strips are placed. Instead of adhesive strips, a special kind of surgical glue is used to keep the incision closed.  · If adhesive strips were placed on the incision sites, do not take them off. They will fall off on their own.  · The incision site may be sore for 1 to 2 days. Pain medicine can help.  · Do not get the incision site wet. Bathe or shower as directed by your caregiver.  · The incision site should heal in 5 to 7 days. A small scar may form after the  incision has healed.  ACCESSING THE PORT  Special steps must be taken to access the port:  · Before the port is accessed, a numbing cream can be placed on the skin. This helps numb the skin over the port site.  · A sterile technique is used to access the port.  · The port is accessed with a needle. Only "non-coring" port needles should be used to access the port. Once the port is accessed, a blood return should be checked. This helps ensure the port is in the vein and is not clogged (clotted).  · If your caregiver believes your port should remain accessed, a clear (transparent) bandage will be placed over the needle site. The bandage and needle will need to be changed every week or as directed by your caregiver.  · Keep the bandage covering the needle clean and dry. Do not get it wet. Follow your caregiver's instructions on how to take a shower or bath when the port is accessed.  · If your port does not need to stay accessed, no bandage is needed over the port.  FLUSHING THE PORT  Flushing the port keeps it from getting clogged. How often the port is flushed depends on:  · If a   constant infusion is running. If a constant infusion is running, the port may not need to be flushed.  · If intermittent medicines are given.  · If the port is not being used.  For intermittent medicines:  · The port will need to be flushed:  · After medicines have been given.  · After blood has been drawn.  · As part of routine maintenance.  · A port is normally flushed with:  · Normal saline.  · Heparin.  · Follow your caregiver's advice on how often, how much, and the type of flush to use on your port.  IMPORTANT PORT INFORMATION  · Tell your caregiver if you are allergic to heparin.  · After your port is placed, you will get a manufacturer's information card. The card has information about your port. Keep this card with you at all times.  · There are many types of ports available. Know what kind of port you have.  · In case of an  emergency, it may be helpful to wear a medical alert bracelet. This can help alert health care workers that you have a port.  · The port can stay in for as long as your caregiver believes it is necessary.  · When it is time for the port to come out, surgery will be done to remove it. The surgery will be similar to how the port was put in.  · If you are in the hospital or clinic:  · Your port will be taken care of and flushed by a nurse.  · If you are at home:  · A home health care nurse may give medicines and take care of the port.  · You or a family member can get special training and directions for giving medicine and taking care of the port at home.  SEEK IMMEDIATE MEDICAL CARE IF:   · Your port does not flush or you are unable to get a blood return.  · New drainage or pus is coming from the incision.  · A bad smell is coming from the incision site.  · You develop swelling or increased redness at the incision site.  · You develop increased swelling or pain at the port site.  · You develop swelling or pain in the surrounding skin near the port.  · You have an oral temperature above 102° F (38.9° C), not controlled by medicine.  MAKE SURE YOU:   · Understand these instructions.  · Will watch your condition.  · Will get help right away if you are not doing well or get worse.  Document Released: 12/11/2005 Document Revised: 03/04/2012 Document Reviewed: 03/04/2009  Exit[employer]® Patient Information ©2014 [employer].

## 2013-12-31 ENCOUNTER — Encounter (HOSPITAL_COMMUNITY): Payer: Self-pay

## 2013-12-31 ENCOUNTER — Encounter (HOSPITAL_BASED_OUTPATIENT_CLINIC_OR_DEPARTMENT_OTHER): Payer: BC Managed Care – PPO

## 2013-12-31 ENCOUNTER — Encounter (HOSPITAL_COMMUNITY): Payer: BC Managed Care – PPO | Attending: Hematology and Oncology

## 2013-12-31 VITALS — BP 118/77 | HR 76 | Resp 16

## 2013-12-31 VITALS — BP 156/92 | HR 77 | Temp 97.0°F | Resp 16 | Wt 153.8 lb

## 2013-12-31 DIAGNOSIS — C50912 Malignant neoplasm of unspecified site of left female breast: Secondary | ICD-10-CM

## 2013-12-31 DIAGNOSIS — C50419 Malignant neoplasm of upper-outer quadrant of unspecified female breast: Secondary | ICD-10-CM

## 2013-12-31 DIAGNOSIS — Z171 Estrogen receptor negative status [ER-]: Secondary | ICD-10-CM

## 2013-12-31 DIAGNOSIS — C50919 Malignant neoplasm of unspecified site of unspecified female breast: Secondary | ICD-10-CM | POA: Insufficient documentation

## 2013-12-31 DIAGNOSIS — F17200 Nicotine dependence, unspecified, uncomplicated: Secondary | ICD-10-CM

## 2013-12-31 DIAGNOSIS — I10 Essential (primary) hypertension: Secondary | ICD-10-CM

## 2013-12-31 DIAGNOSIS — B370 Candidal stomatitis: Secondary | ICD-10-CM | POA: Insufficient documentation

## 2013-12-31 DIAGNOSIS — E785 Hyperlipidemia, unspecified: Secondary | ICD-10-CM

## 2013-12-31 DIAGNOSIS — Z5111 Encounter for antineoplastic chemotherapy: Secondary | ICD-10-CM

## 2013-12-31 LAB — CBC WITH DIFFERENTIAL/PLATELET
BASOS ABS: 0 10*3/uL (ref 0.0–0.1)
Basophils Relative: 0 % (ref 0–1)
EOS ABS: 0 10*3/uL (ref 0.0–0.7)
Eosinophils Relative: 0 % (ref 0–5)
HCT: 37.5 % (ref 36.0–46.0)
Hemoglobin: 13 g/dL (ref 12.0–15.0)
Lymphocytes Relative: 9 % — ABNORMAL LOW (ref 12–46)
Lymphs Abs: 0.9 10*3/uL (ref 0.7–4.0)
MCH: 30.8 pg (ref 26.0–34.0)
MCHC: 34.7 g/dL (ref 30.0–36.0)
MCV: 88.9 fL (ref 78.0–100.0)
Monocytes Absolute: 0.2 10*3/uL (ref 0.1–1.0)
Monocytes Relative: 2 % — ABNORMAL LOW (ref 3–12)
Neutro Abs: 9.6 10*3/uL — ABNORMAL HIGH (ref 1.7–7.7)
Neutrophils Relative %: 89 % — ABNORMAL HIGH (ref 43–77)
PLATELETS: 226 10*3/uL (ref 150–400)
RBC: 4.22 MIL/uL (ref 3.87–5.11)
RDW: 12.7 % (ref 11.5–15.5)
WBC: 10.7 10*3/uL — AB (ref 4.0–10.5)

## 2013-12-31 LAB — COMPREHENSIVE METABOLIC PANEL
ALT: 31 U/L (ref 0–35)
AST: 24 U/L (ref 0–37)
Albumin: 4.5 g/dL (ref 3.5–5.2)
Alkaline Phosphatase: 196 U/L — ABNORMAL HIGH (ref 39–117)
BUN: 21 mg/dL (ref 6–23)
CALCIUM: 10.5 mg/dL (ref 8.4–10.5)
CO2: 27 mEq/L (ref 19–32)
Chloride: 95 mEq/L — ABNORMAL LOW (ref 96–112)
Creatinine, Ser: 0.86 mg/dL (ref 0.50–1.10)
GFR calc non Af Amer: 75 mL/min — ABNORMAL LOW (ref >90)
GFR, EST AFRICAN AMERICAN: 87 mL/min — AB (ref >90)
Glucose, Bld: 194 mg/dL — ABNORMAL HIGH (ref 70–99)
Potassium: 4.2 mEq/L (ref 3.7–5.3)
Sodium: 136 mEq/L — ABNORMAL LOW (ref 137–147)
TOTAL PROTEIN: 8.9 g/dL — AB (ref 6.0–8.3)
Total Bilirubin: 0.3 mg/dL (ref 0.3–1.2)

## 2013-12-31 LAB — CANCER ANTIGEN 27.29: CA 27.29: 53 U/mL — ABNORMAL HIGH (ref 0–39)

## 2013-12-31 MED ORDER — DOCETAXEL CHEMO INJECTION 160 MG/16ML
75.0000 mg/m2 | Freq: Once | INTRAVENOUS | Status: AC
  Administered 2013-12-31: 130 mg via INTRAVENOUS
  Filled 2013-12-31: qty 13

## 2013-12-31 MED ORDER — SODIUM CHLORIDE 0.9 % IV SOLN
500.0000 mg/m2 | Freq: Once | INTRAVENOUS | Status: AC
  Administered 2013-12-31: 860 mg via INTRAVENOUS
  Filled 2013-12-31: qty 43

## 2013-12-31 MED ORDER — SODIUM CHLORIDE 0.9 % IJ SOLN
10.0000 mL | INTRAMUSCULAR | Status: DC | PRN
  Administered 2013-12-31: 10 mL

## 2013-12-31 MED ORDER — PALONOSETRON HCL INJECTION 0.25 MG/5ML
0.2500 mg | Freq: Once | INTRAVENOUS | Status: AC
  Administered 2013-12-31: 0.25 mg via INTRAVENOUS
  Filled 2013-12-31: qty 5

## 2013-12-31 MED ORDER — HEPARIN SOD (PORK) LOCK FLUSH 100 UNIT/ML IV SOLN
500.0000 [IU] | Freq: Once | INTRAVENOUS | Status: AC | PRN
  Administered 2013-12-31: 500 [IU]

## 2013-12-31 MED ORDER — SODIUM CHLORIDE 0.9 % IV SOLN
Freq: Once | INTRAVENOUS | Status: AC
  Administered 2013-12-31: 11:00:00 via INTRAVENOUS
  Filled 2013-12-31: qty 5

## 2013-12-31 MED ORDER — SODIUM CHLORIDE 0.9 % IV SOLN: 150.0000 mg | Freq: Once | INTRAVENOUS | Status: DC

## 2013-12-31 MED ORDER — HEPARIN SOD (PORK) LOCK FLUSH 100 UNIT/ML IV SOLN
INTRAVENOUS | Status: AC
  Filled 2013-12-31: qty 5

## 2013-12-31 MED ORDER — DOXORUBICIN HCL CHEMO IV INJECTION 2 MG/ML
50.0000 mg/m2 | Freq: Once | INTRAVENOUS | Status: AC
  Administered 2013-12-31: 86 mg via INTRAVENOUS
  Filled 2013-12-31: qty 43

## 2013-12-31 MED ORDER — DEXAMETHASONE SODIUM PHOSPHATE 20 MG/5ML IJ SOLN: 12.0000 mg | Freq: Once | INTRAMUSCULAR | Status: DC

## 2013-12-31 MED ORDER — SODIUM CHLORIDE 0.9 % IV SOLN
Freq: Once | INTRAVENOUS | Status: AC
  Administered 2013-12-31: 500 mL via INTRAVENOUS

## 2013-12-31 NOTE — Progress Notes (Signed)
Drusilla Kanner Saline tolerated infusions well and without incident; verbalizes understanding for follow-up.  No distress noted at time of discharge and patient was discharged home with her sister.

## 2013-12-31 NOTE — Progress Notes (Signed)
[facility]  OFFICE PROGRESS [name], MD [address] [place] [address]  DIAGNOSIS: Breast cancer, left  Chief Complaint  Patient presents with  . Breast Cancer    To begin TAC chemotherapy today adjuvantly    CURRENT THERAPY: To begin cycle #1 of adjuvant chemotherapy with TAC today  INTERVAL HISTORY: Sally [name] 58 y.o. 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 She feels well with good appetite and no peripheral paresthesias. She denies a diarrhea, constipation, PND, orthopnea, headache, lymphedema, sore throat, headache, or seizures.   MEDICAL HISTORY: Past Medical History  Diagnosis Date  . Chronic sinusitis   . Hypertension   . Diabetes mellitus without complication     diet controlled  . Cancer 11/12/2013    left breast cancer    INTERIM HISTORY: has GANGLION-HAND/WRIST and Breast cancer on her problem list.    ALLERGIES:  has No Known Allergies.  MEDICATIONS: has a current medication list which includes the following prescription(s): atorvastatin, dexamethasone, lidocaine-prilocaine, loratadine, triamterene-hydrochlorothiazide, hydrocodone-acetaminophen, ibuprofen, metoclopramide, and prochlorperazine.  SURGICAL HISTORY:  Past Surgical History  Procedure Laterality Date  . Ectopic pregnancy surgery    . Abdominal hysterectomy    . Left leg surgery      17 yrs of age  . Ganglion cyst excision    . Dilation and curettage of uterus    . Colonoscopy  01/09/2012    Procedure: COLONOSCOPY;  Surgeon: Dorothyann Peng, MD;  Location: [facility];  Service: Endoscopy;  Laterality: N/A;  1:30 pm  . Inguinal hernia repair Left   . Partial mastectomy with needle localization and axillary sentinel lymph node bx  Left 11/12/2013    Procedure: PARTIAL MASTECTOMY WITH NEEDLE LOCALIZATION AND AXILLARY SENTINEL LYMPH NODE BX;  Surgeon: Jamesetta So, MD;  Location: [facility] ORS;  Service: General;  Laterality: Left;    FAMILY HISTORY: family history includes Diabetes type II in her father and mother; Prostate cancer in her father.  SOCIAL HISTORY:  reports that she has been smoking Cigarettes.  She has a 3.75 pack-year smoking history. She has never used smokeless tobacco. She reports that she drinks alcohol. She reports that she does not use illicit drugs.  REVIEW OF SYSTEMS:  Other than that discussed above is noncontributory.  PHYSICAL EXAMINATION: ECOG PERFORMANCE STATUS: 0 - Asymptomatic  Blood pressure 156/92, pulse 77, temperature 97 F (36.1 C), temperature source Oral, resp. rate 16, weight 153 lb 12.8 oz (69.763 kg).  GENERAL:alert, no distress and comfortable SKIN: skin color, texture, turgor are normal, no rashes or significant lesions EYES: PERLA; Conjunctiva are pink and non-injected, sclera clear OROPHARYNX:no exudate, no erythema on lips, buccal mucosa, or tongue. NECK: supple, thyroid normal size, non-tender, without nodularity. No masses CHEST: Status post left breast lumpectomy with no masses in either breast. Life port in place and functioning well. LYMPH:  no palpable lymphadenopathy in the cervical, axillary or inguinal LUNGS: clear to auscultation and percussion with normal breathing effort HEART: regular rate & rhythm and no murmurs. ABDOMEN:abdomen soft, non-tender and normal bowel sounds MUSCULOSKELETAL:no cyanosis of digits and no clubbing. Range of motion normal.  NEURO: alert & oriented x 3 with fluent speech, no focal motor/sensory deficits   LABORATORY DATA: Admission on 12/29/2013, Discharged on 12/29/2013  Component Date Value Range Status  . Glucose-Capillary 12/29/2013 97  70 - 99 mg/dL Final  . Glucose-Capillary 12/29/2013 96  70 - 99 mg/dL Final  Office Visit on  12/04/2013  Component Date Value Range Status  . [place] 12/04/2013 48.6   Final   Comment: (NOTE)                          Reference Ranges:                                  Female:     20 - 70 Years           1.5 -  9.3 mIU/mL                                               > 70 Years           3.1 - 34.6 mIU/mL                                  Female:   Follicular Phase        1.9 - 12.5 mIU/mL                                            Midcycle                8.7 - 76.3 mIU/mL                                            Luteal Phase            0.5 - 16.9 mIU/mL                                            Post Menopausal        15.9 - 54.0 mIU/mL                                            Pregnant                    <  1.5 mIU/mL                                            Contraceptives          0.7 -  5.6 mIU/mL                                  Children:                             <  6.0 mIU/mL                          Performed at [facility]  . [facility] 12/04/2013 102.3   Final   Comment: (NOTE)                          Reference Ranges:                                  Female:                         1.4 -  18.1 mIU/mL                                  Female:   Follicular Phase    2.5 -  10.2 mIU/mL                                            MidCycle Peak       3.4 -  33.4 mIU/mL                                            Luteal Phase        1.5 -   9.1 mIU/mL                                            Post Menopausal    23.0 - 116.3 mIU/mL                                            Pregnant                <   0.3 mIU/mL                          Performed at [facility]  . WBC 12/04/2013 5.2  4.0 - 10.5 K/uL Final  . RBC 12/04/2013 4.15  3.87 - 5.11 MIL/uL Final  . Hemoglobin 12/04/2013 12.9  12.0 - 15.0 g/dL Final  . HCT 12/04/2013 37.9  36.0 - 46.0 % Final  . MCV 12/04/2013 91.3  78.0 - 100.0 fL Final  . MCH 12/04/2013 31.1  26.0 - 34.0 pg Final  . MCHC 12/04/2013 34.0  30.0  - 36.0 g/dL Final  . RDW 12/04/2013 13.7  11.5 - 15.5 % Final  . Platelets 12/04/2013 249  150 - 400 K/uL Final  . Neutrophils Relative % 12/04/2013 63  43 - 77 % Final  . Neutro Abs 12/04/2013 3.3  1.7 - 7.7 K/uL Final  . Lymphocytes Relative 12/04/2013 27  12 - 46 % Final  . Lymphs Abs 12/04/2013 1.4  0.7 - 4.0 K/uL Final  . Monocytes Relative 12/04/2013 8  3 - 12 %  Final  . Monocytes Absolute 12/04/2013 0.4  0.1 - 1.0 K/uL Final  . Eosinophils Relative 12/04/2013 2  0 - 5 % Final  . Eosinophils Absolute 12/04/2013 0.1  0.0 - 0.7 K/uL Final  . Basophils Relative 12/04/2013 1  0 - 1 % Final  . Basophils Absolute 12/04/2013 0.0  0.0 - 0.1 K/uL Final  . Sodium 12/04/2013 136  135 - 145 mEq/L Final  . Potassium 12/04/2013 3.4* 3.5 - 5.1 mEq/L Final  . Chloride 12/04/2013 95* 96 - 112 mEq/L Final  . CO2 12/04/2013 28  19 - 32 mEq/L Final  . Glucose, Bld 12/04/2013 102* 70 - 99 mg/dL Final  . BUN 12/04/2013 17  6 - 23 mg/dL Final  . Creatinine, Ser 12/04/2013 0.99  0.50 - 1.10 mg/dL Final  . Calcium 12/04/2013 10.4  8.4 - 10.5 mg/dL Final  . Total Protein 12/04/2013 8.5* 6.0 - 8.3 g/dL Final  . Albumin 12/04/2013 4.6  3.5 - 5.2 g/dL Final  . AST 12/04/2013 30  0 - 37 U/L Final  . ALT 12/04/2013 30  0 - 35 U/L Final  . Alkaline Phosphatase 12/04/2013 199* 39 - 117 U/L Final  . Total Bilirubin 12/04/2013 0.7  0.3 - 1.2 mg/dL Final  . GFR calc non Af Amer 12/04/2013 63* >90 mL/min Final  . GFR calc Af Amer 12/04/2013 73* >90 mL/min Final   Comment: (NOTE)                          The eGFR has been calculated using the CKD EPI equation.                          This calculation has not been validated in all clinical situations.                          eGFR's persistently <90 mL/min signify possible Chronic Kidney                          Disease.  . CEA 12/04/2013 1.6  0.0 - 5.0 ng/mL Final   Performed at [facility]  . CA 27.29 12/04/2013 64* 0 - 39 U/mL Final   Performed at  [facility]  . Estradiol 12/04/2013 13.5   Final   Comment: (NOTE)                            Males                           0.0 -  39.0 pg/mL                            Menstruating Females (by day in cycle relative to LH peak)                            Follicular phase (-12 to -4)   19.5 - 144.2 pg/mL                            Midcycle          (-3 to +2)   63.9 - 356.7 pg/mL  Postmenopausal Females          0.0 -  32.2 pg/mL                              (untreated)                          Performed at [facility]: Tumor is triple negative after Oncotype DX genomic analysis. Original biopsy was called your positive. Oncotype DX recurrence score was 44.  Urinalysis No results found for this basename: colorurine, appearanceur, labspec, phurine, glucoseu, hgbur, bilirubinur, ketonesur, proteinur, urobilinogen, nitrite, leukocytesur    RADIOGRAPHIC STUDIES: Nm Cardiac Muga Rest  12/23/2013   CLINICAL DATA:  Left breast cancer. Pre chemotherapy cardiac evaluation.  EXAM: NUCLEAR MEDICINE CARDIAC BLOOD POOL IMAGING (MUGA)  TECHNIQUE: Cardiac multi-gated acquisition was performed at rest following intravenous injection of Tc-4mlabeled red blood cells.  COMPARISON:  Chest radiographs 11/06/2013.  RADIOPHARMACEUTICALS:  25 MILLICURIE ULTRATAG TECHNETIUM TC 45M-LABELED RED BLOOD CELLS IV KITmCiTc-920mn-vitro labeled red blood cells.  FINDINGS: Gated images demonstrate normal left ventricular wall motion. The calculated left ventricular ejection fraction is 72%.  IMPRESSION: Normal left ventricular wall motion with calculated ejection fraction of 72%.   Electronically Signed   By: BiCamie Patience.D.   On: 12/23/2013 15:58   Dg Chest Port 1 View  12/29/2013   CLINICAL DATA:  Status post port placement  EXAM: PORTABLE CHEST - 1 VIEW  COMPARISON:  None.  FINDINGS: Cardiac shadow is within normal limits. A  right-sided chest wall port is seen with the catheter tip in the mid superior vena cava. No pneumothorax is seen. No bony abnormality is seen.  IMPRESSION: No pneumothorax following port placement   Electronically Signed   By: MaInez Catalina.D.   On: 12/29/2013 12:31   Dg C-arm 1-60 Min-no Report  12/29/2013   CLINICAL DATA: breast cancer   C-ARM 1-60 MINUTES  Fluoroscopy was utilized by the requesting physician.  No radiographic  interpretation.     ASSESSMENT:  #1. Stage I left breast cancer, triple negative, to begin chemotherapy today with TAC, status post lumpectomy and sentinel node biopsy with Oncotype DX recurrence score 44. #2. Hypertension, controlled. #3. Allergic rhinitis, controlled. #4. Hyperlipidemia, on treatment.   PLAN:  #1. Cycle 1 of TAC today. Patient was warned that her urine may be red after receiving doxorubicin. #2. Return tomorrow for Neulasta 6 mg subcutaneously to lessen the duration of neutropenia. She was warned that bone pain may occur and she does have hydrocodone at home.. #3. Followup in one week with CBC.   All questions were answered. The patient knows to call the clinic with any problems, questions or concerns. We can certainly see the patient much sooner if necessary.   I spent 25 minutes counseling the patient face to face. The total time spent in the appointment was 30 minutes.    FoDoroteo BradfordMD 12/31/2013 9:49 AM

## 2014-01-01 ENCOUNTER — Encounter (HOSPITAL_BASED_OUTPATIENT_CLINIC_OR_DEPARTMENT_OTHER): Payer: BC Managed Care – PPO

## 2014-01-01 VITALS — BP 122/84 | HR 68 | Resp 18

## 2014-01-01 DIAGNOSIS — Z5189 Encounter for other specified aftercare: Secondary | ICD-10-CM

## 2014-01-01 DIAGNOSIS — C50919 Malignant neoplasm of unspecified site of unspecified female breast: Secondary | ICD-10-CM

## 2014-01-01 MED ORDER — PEGFILGRASTIM INJECTION 6 MG/0.6ML
SUBCUTANEOUS | Status: AC
  Filled 2014-01-01: qty 0.6

## 2014-01-01 MED ORDER — PEGFILGRASTIM INJECTION 6 MG/0.6ML
6.0000 mg | Freq: Once | SUBCUTANEOUS | Status: AC
  Administered 2014-01-01: 6 mg via SUBCUTANEOUS

## 2014-01-01 NOTE — Progress Notes (Signed)
Sally [name] presents today for injection per MD orders. Neulasta 6mg  administered SQ in left lower abdomen. Administration without incident. Patient tolerated well.  Denies any complaints post chemo rx yesterday.

## 2014-01-06 ENCOUNTER — Encounter (HOSPITAL_COMMUNITY): Payer: Self-pay | Admitting: Hematology and Oncology

## 2014-01-06 ENCOUNTER — Encounter (HOSPITAL_BASED_OUTPATIENT_CLINIC_OR_DEPARTMENT_OTHER): Payer: BC Managed Care – PPO | Admitting: Oncology

## 2014-01-06 ENCOUNTER — Telehealth (HOSPITAL_COMMUNITY): Payer: Self-pay | Admitting: Hematology and Oncology

## 2014-01-06 ENCOUNTER — Encounter (HOSPITAL_COMMUNITY): Payer: Self-pay | Admitting: Oncology

## 2014-01-06 VITALS — BP 91/73 | HR 126 | Temp 97.9°F | Resp 16 | Wt 147.2 lb

## 2014-01-06 DIAGNOSIS — K5900 Constipation, unspecified: Secondary | ICD-10-CM

## 2014-01-06 DIAGNOSIS — C50419 Malignant neoplasm of upper-outer quadrant of unspecified female breast: Secondary | ICD-10-CM

## 2014-01-06 DIAGNOSIS — I959 Hypotension, unspecified: Secondary | ICD-10-CM

## 2014-01-06 DIAGNOSIS — R110 Nausea: Secondary | ICD-10-CM

## 2014-01-06 DIAGNOSIS — B370 Candidal stomatitis: Secondary | ICD-10-CM

## 2014-01-06 DIAGNOSIS — C50912 Malignant neoplasm of unspecified site of left female breast: Secondary | ICD-10-CM

## 2014-01-06 MED ORDER — FIRST-DUKES MOUTHWASH MT SUSP: 10.0000 mL | Freq: Four times a day (QID) | OROMUCOSAL | Status: DC | PRN

## 2014-01-06 NOTE — Patient Instructions (Signed)
[facility] Discharge Instructions  RECOMMENDATIONS MADE BY THE CONSULTANT AND ANY TEST RESULTS WILL BE SENT TO YOUR REFERRING PHYSICIAN.  MEDICATIONS PRESCRIBED:  [employer] Magic Mouthwash.  Swish and swallow four times daily until mouth is better. Senokot-S 2 tablets in the AM and PM daily to soften stool.  INSTRUCTIONS GIVEN AND DISCUSSED: Take nausea medicines as prescribed Decrease Dexamethasone (Decadron) to 1 tablet in the AM and 1 tablet in the PM before chemotherapy and 2 days following chemotherapy. Increase fluid intake.  SPECIAL INSTRUCTIONS/FOLLOW-UP: Return as scheduled for follow-up.  Please call us if anti-nausea medicine does not help.  Thank you for choosing [facility] to provide your oncology and hematology care.  To afford each patient quality time with our providers, please arrive at least 15 minutes before your scheduled appointment time.  With your help, our goal is to use those 15 minutes to complete the necessary work-up to ensure our physicians have the information they need to help with your evaluation and healthcare recommendations.    Effective January 1st, 2014, we ask that you re-schedule your appointment with our physicians should you arrive 10 or more minutes late for your appointment.  We strive to give you quality time with our providers, and arriving late affects you and other patients whose appointments are after yours.    Again, thank you for choosing [facility].  Our hope is that these requests will decrease the amount of time that you wait before being seen by our physicians.       _____________________________________________________________  Should you have questions after your visit to [facility], please contact our office at [phone] between the hours of 8:30 a.m. and 5:00 p.m.  Voicemails left after 4:30 p.m. will not be returned until the following business day.  For prescription  refill requests, have your pharmacy contact our office with your prescription refill request.

## 2014-01-06 NOTE — Progress Notes (Signed)
Davy, MD [address] [place] [address]  Invasive ductal carcinoma of left breast  Oral candidiasis - Plan: metFORMIN (GLUCOPHAGE) 500 MG tablet, Magnesium Hydroxide (MILK OF MAGNESIA PO), Diphenhyd-Hydrocort-Nystatin (FIRST-DUKES MOUTHWASH) SUSP  Hypotension  Constipation  Nausea alone  CURRENT THERAPY: TAC with Neulasta support beginning on 12/31/13  INTERVAL HISTORY: Sally [name] 58 y.o. female returns for  regular  visit for followup of Stage I Invasive Ductal Carcinoma, S/P Lumpectomy on 11/12/2013 by Dr. Arnoldo Morale with 0/1 positive lymph nodes.  Now on TAC chemotherapy with Neulasta support beginning on 12/31/2013.   She is tolerating therapy well, but with a few side effects and toxicities:  1. Nausea without vomiting- she is not taking her antiemetics as prescribed.  She wasn't sure the timeframe in which she could take the anti-nausea pills.  I provided her education that there is not a relationship between timing of chemotherapy and when she may/or may not consume her antiemetic medications.  She admits that they worked when she took them.   2. Constipation- her usual bowel habit is 1 BM every other day prior to chemotherapy.  I will start her on Senokot-S 2 tabs in AM and 2 tabs in PM.  She will take MOM as needed.  I provided her education regarding a bowel regimen.  She can adjust her Senokot-S dose according to stool consistency.  3. Hyperglycemia- we will decrease Dexamethasone dose to 1 tab in Am and 1 tab in PM day before chemotherapy and x 2 days following chemotherapy.  She will check glucose via glucometer and follow-up with PCP accordingly.  She is on Metformin 500 mg daily.   4. Oral candidiasis- I provided her education regarding this diagnosis.  We will treat with Duke Magic Mouthwash.    5. Inability to work full-time- She tried to work full-time since starting treatment and she reports that she is unable to do so.  She is trying to  get FMLA.  I will conduct a letter and send to her company to get her FMLA.  Additionally, I wrote an letter for her to be excused from work.  Past Medical History  Diagnosis Date  . Chronic sinusitis   . Hypertension   . Diabetes mellitus without complication     diet controlled  . Cancer 11/12/2013    left breast cancer  . Invasive ductal carcinoma of left breast 12/11/2013    has GANGLION-HAND/WRIST and Invasive ductal carcinoma of left breast on her problem list.     has No Known Allergies.  Sally [name] does not currently have medications on file.  Past Surgical History  Procedure Laterality Date  . Ectopic pregnancy surgery    . Abdominal hysterectomy    . Left leg surgery      17 yrs of age  . Ganglion cyst excision    . Dilation and curettage of uterus    . Colonoscopy  01/09/2012    Procedure: COLONOSCOPY;  Surgeon: Dorothyann Peng, MD;  [facility];  Service: Endoscopy;  Laterality: N/A;  1:30 pm  . Inguinal hernia repair Left   . Partial mastectomy with needle localization and axillary sentinel lymph node bx Left 11/12/2013    Procedure: PARTIAL MASTECTOMY WITH NEEDLE LOCALIZATION AND AXILLARY SENTINEL LYMPH NODE BX;  Surgeon: Jamesetta So, MD;  Location: [facility];  Service: General;  Laterality: Left;  . Portacath placement Right 12/29/2013    Procedure: INSERTION PORT-A-CATH;  Surgeon: Jamesetta So,  MD;  Location: [facility];  Service: General;  Laterality: Right;    Denies any headaches, double vision, fevers, chills, night sweats, vomiting, diarrhea, constipation, chest pain, heart palpitations, shortness of breath, blood in stool, black tarry stool, urinary pain, urinary burning, urinary frequency, hematuria.   PHYSICAL EXAMINATION  ECOG PERFORMANCE STATUS: 1 - Symptomatic but completely ambulatory  Filed Vitals:   01/06/14 1300  BP: 91/73  Pulse: 126  Temp: 97.9 F (36.6 C)  Resp: 16    GENERAL:alert, no distress and cooperative SKIN: skin  color, texture, turgor are normal HEAD: Normocephalic, No masses, lesions, tenderness or abnormalities EYES: normal, PERRLA, EOMI, Conjunctiva are pink and non-injected EARS: External ears normal OROPHARYNX:thrush  NECK: supple, trachea midline LYMPH:  not examined BREAST:not examined LUNGS: not examined HEART: not examined ABDOMEN:not examined BACK: Back symmetric, no curvature. EXTREMITIES:less then 2 second capillary refill, no skin discoloration, no cyanosis  NEURO: alert & oriented x 3 with fluent speech, no focal motor/sensory deficits, gait normal   LABORATORY DATA: CBC    Component Value Date/Time   WBC 10.7* 12/31/2013 0911   RBC 4.22 12/31/2013 0911   HGB 13.0 12/31/2013 0911   HCT 37.5 12/31/2013 0911   PLT 226 12/31/2013 0911   MCV 88.9 12/31/2013 0911   MCH 30.8 12/31/2013 0911   MCHC 34.7 12/31/2013 0911   RDW 12.7 12/31/2013 0911   LYMPHSABS 0.9 12/31/2013 0911   MONOABS 0.2 12/31/2013 0911   EOSABS 0.0 12/31/2013 0911   BASOSABS 0.0 12/31/2013 0911      Chemistry      Component Value Date/Time   NA 136* 12/31/2013 0911   K 4.2 12/31/2013 0911   CL 95* 12/31/2013 0911   CO2 27 12/31/2013 0911   BUN 21 12/31/2013 0911   CREATININE 0.86 12/31/2013 0911      Component Value Date/Time   CALCIUM 10.5 12/31/2013 0911   ALKPHOS 196* 12/31/2013 0911   AST 24 12/31/2013 0911   ALT 31 12/31/2013 0911   BILITOT 0.3 12/31/2013 0911        ASSESSMENT:  1. Stage I Invasive Ductal Carcinoma, S/P Lumpectomy on 11/12/2013 by Dr. Arnoldo Morale with 0/1 positive lymph nodes.  Now on TAC chemotherapy with Neulasta support beginning on 12/31/2013.  2. Hypotension 3. Oral Candidiasis 4. Nausea, without vomiting 5. Constipation  Patient Active Problem List   Diagnosis Date Noted  . 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 GANGLION-HAND/WRIST 03/04/2008    PLAN:  1. Recommend increased PO fluid due to hypotension 2. Decrease Dexamethasone 1 mg BID the day before chemotherapy and 2 days following  chemotherapy 3. Duke Magic Mouthwash escribed to pharmacy.   4. Encouraged anti-emetic usage at home 5. Recommend OTC Senokot-S 2 tabs BID.  May adjust according to stool consistency 6. Recommend MOM for constipation >2-3 days 7. Patient education regarding anti-emetic regimen 8. Patient education regarding oral candidiasis 9. Patient education regarding bowel regimen 10. Letter composed and faxed for [facility] approval. 11. Return as scheduled.    THERAPY PLAN:  We will continue with chemotherapy as scheduled and manage side effects of therapy.   All questions were answered. The patient knows to call the clinic with any problems, questions or concerns. We can certainly see the patient much sooner if necessary.  Patient and plan discussed with Dr. Farrel Gobble and he is in agreement with the aforementioned.    Roderica Cathell

## 2014-01-06 NOTE — Telephone Encounter (Signed)
PC TO [employer] [phone] TO [facility] IF CPT [id number] REQUIRE AUTH PER [facility] W NO AUTH IS REQUIRED

## 2014-01-21 ENCOUNTER — Inpatient Hospital Stay (HOSPITAL_COMMUNITY): Payer: BC Managed Care – PPO

## 2014-01-22 ENCOUNTER — Ambulatory Visit (HOSPITAL_COMMUNITY): Payer: BC Managed Care – PPO

## 2014-01-23 ENCOUNTER — Encounter (HOSPITAL_COMMUNITY): Payer: Self-pay

## 2014-01-23 ENCOUNTER — Encounter (HOSPITAL_BASED_OUTPATIENT_CLINIC_OR_DEPARTMENT_OTHER): Payer: BC Managed Care – PPO

## 2014-01-23 VITALS — BP 129/84 | HR 73 | Temp 97.3°F | Resp 18 | Wt 152.4 lb

## 2014-01-23 DIAGNOSIS — E1165 Type 2 diabetes mellitus with hyperglycemia: Secondary | ICD-10-CM

## 2014-01-23 DIAGNOSIS — B370 Candidal stomatitis: Secondary | ICD-10-CM

## 2014-01-23 DIAGNOSIS — IMO0001 Reserved for inherently not codable concepts without codable children: Secondary | ICD-10-CM

## 2014-01-23 DIAGNOSIS — C50419 Malignant neoplasm of upper-outer quadrant of unspecified female breast: Secondary | ICD-10-CM

## 2014-01-23 DIAGNOSIS — C50912 Malignant neoplasm of unspecified site of left female breast: Secondary | ICD-10-CM

## 2014-01-23 DIAGNOSIS — Z5111 Encounter for antineoplastic chemotherapy: Secondary | ICD-10-CM

## 2014-01-23 DIAGNOSIS — K5900 Constipation, unspecified: Secondary | ICD-10-CM

## 2014-01-23 DIAGNOSIS — R110 Nausea: Secondary | ICD-10-CM

## 2014-01-23 LAB — COMPREHENSIVE METABOLIC PANEL
ALK PHOS: 139 U/L — AB (ref 39–117)
ALT: 29 U/L (ref 0–35)
AST: 20 U/L (ref 0–37)
Albumin: 4 g/dL (ref 3.5–5.2)
BILIRUBIN TOTAL: 0.2 mg/dL — AB (ref 0.3–1.2)
BUN: 21 mg/dL (ref 6–23)
CO2: 27 meq/L (ref 19–32)
Calcium: 10.4 mg/dL (ref 8.4–10.5)
Chloride: 99 mEq/L (ref 96–112)
Creatinine, Ser: 0.93 mg/dL (ref 0.50–1.10)
GFR, EST AFRICAN AMERICAN: 79 mL/min — AB (ref >90)
GFR, EST NON AFRICAN AMERICAN: 68 mL/min — AB (ref >90)
Glucose, Bld: 149 mg/dL — ABNORMAL HIGH (ref 70–99)
POTASSIUM: 4.1 meq/L (ref 3.7–5.3)
Sodium: 139 mEq/L (ref 137–147)
TOTAL PROTEIN: 7.6 g/dL (ref 6.0–8.3)

## 2014-01-23 LAB — CBC WITH DIFFERENTIAL/PLATELET
Basophils Absolute: 0 10*3/uL (ref 0.0–0.1)
Basophils Relative: 0 % (ref 0–1)
EOS ABS: 0 10*3/uL (ref 0.0–0.7)
Eosinophils Relative: 0 % (ref 0–5)
HCT: 34.2 % — ABNORMAL LOW (ref 36.0–46.0)
HEMOGLOBIN: 11.9 g/dL — AB (ref 12.0–15.0)
LYMPHS ABS: 1 10*3/uL (ref 0.7–4.0)
Lymphocytes Relative: 8 % — ABNORMAL LOW (ref 12–46)
MCH: 30.8 pg (ref 26.0–34.0)
MCHC: 34.8 g/dL (ref 30.0–36.0)
MCV: 88.6 fL (ref 78.0–100.0)
Monocytes Absolute: 0.8 10*3/uL (ref 0.1–1.0)
Monocytes Relative: 6 % (ref 3–12)
NEUTROS PCT: 86 % — AB (ref 43–77)
Neutro Abs: 10.7 10*3/uL — ABNORMAL HIGH (ref 1.7–7.7)
Platelets: 277 10*3/uL (ref 150–400)
RBC: 3.86 MIL/uL — AB (ref 3.87–5.11)
RDW: 13 % (ref 11.5–15.5)
WBC: 12.5 10*3/uL — ABNORMAL HIGH (ref 4.0–10.5)

## 2014-01-23 MED ORDER — DOXORUBICIN HCL CHEMO IV INJECTION 2 MG/ML
50.0000 mg/m2 | Freq: Once | INTRAVENOUS | Status: AC
  Administered 2014-01-23: 86 mg via INTRAVENOUS
  Filled 2014-01-23: qty 43

## 2014-01-23 MED ORDER — HEPARIN SOD (PORK) LOCK FLUSH 100 UNIT/ML IV SOLN
500.0000 [IU] | Freq: Once | INTRAVENOUS | Status: AC | PRN
  Administered 2014-01-23: 500 [IU]
  Filled 2014-01-23: qty 5

## 2014-01-23 MED ORDER — DEXAMETHASONE SODIUM PHOSPHATE 20 MG/5ML IJ SOLN: 12.0000 mg | Freq: Once | INTRAMUSCULAR | Status: DC

## 2014-01-23 MED ORDER — SODIUM CHLORIDE 0.9 % IV SOLN
500.0000 mg/m2 | Freq: Once | INTRAVENOUS | Status: AC
  Administered 2014-01-23: 860 mg via INTRAVENOUS
  Filled 2014-01-23: qty 43

## 2014-01-23 MED ORDER — SODIUM CHLORIDE 0.9 % IV SOLN
Freq: Once | INTRAVENOUS | Status: AC
  Administered 2014-01-23: 10:00:00 via INTRAVENOUS

## 2014-01-23 MED ORDER — SODIUM CHLORIDE 0.9 % IV SOLN
Freq: Once | INTRAVENOUS | Status: AC
  Administered 2014-01-23: 10:00:00 via INTRAVENOUS
  Filled 2014-01-23: qty 5

## 2014-01-23 MED ORDER — DOCETAXEL CHEMO INJECTION 160 MG/16ML
75.0000 mg/m2 | Freq: Once | INTRAVENOUS | Status: AC
  Administered 2014-01-23: 130 mg via INTRAVENOUS
  Filled 2014-01-23: qty 13

## 2014-01-23 MED ORDER — PALONOSETRON HCL INJECTION 0.25 MG/5ML
0.2500 mg | Freq: Once | INTRAVENOUS | Status: AC
  Administered 2014-01-23: 0.25 mg via INTRAVENOUS
  Filled 2014-01-23: qty 5

## 2014-01-23 MED ORDER — SODIUM CHLORIDE 0.9 % IV SOLN: 150.0000 mg | Freq: Once | INTRAVENOUS | Status: DC

## 2014-01-23 MED ORDER — SODIUM CHLORIDE 0.9 % IJ SOLN
10.0000 mL | INTRAMUSCULAR | Status: DC | PRN
  Administered 2014-01-23: 10 mL

## 2014-01-23 NOTE — Patient Instructions (Signed)
[facility] Discharge Instructions for Patients Receiving Chemotherapy  Today you received the following chemotherapy agents Adriamycin, Cytoxan and Taxotere.  To help prevent nausea and vomiting after your treatment, we encourage you to take your nausea medication as needed. If you develop nausea and vomiting that is not controlled by your nausea medication, call the clinic. If it is after clinic hours your family physician or the after hours number for the clinic or go to the Emergency Department.  On the days that you take dexamethasone, increase your metformin to 2 pills in the am and 2 pills in the pm. Keep a record of your blood sugar levels, write them down every day. Use the mouthwash that you have for 5-7 days starting the day of chemotherapy to hopefully prevent a recurrence of thrush. Continue chemotherapy and injection appointments as scheduled. MD appointment again in 3 weeks with chemo appointment. Report any issues/concerns to clinic as needed prior to appointments.   BELOW ARE SYMPTOMS THAT SHOULD BE REPORTED IMMEDIATELY:  *FEVER GREATER THAN 101.0 F  *CHILLS WITH OR WITHOUT FEVER  NAUSEA AND VOMITING THAT IS NOT CONTROLLED WITH YOUR NAUSEA MEDICATION  *UNUSUAL SHORTNESS OF BREATH  *UNUSUAL BRUISING OR BLEEDING  TENDERNESS IN MOUTH AND THROAT WITH OR WITHOUT PRESENCE OF ULCERS  *URINARY PROBLEMS  *BOWEL PROBLEMS  UNUSUAL RASH Items with * indicate a potential emergency and should be followed up as soon as possible.  One of the nurses will contact you 24 hours after your treatment. Please let the nurse know about any problems that you may have experienced. Feel free to call the clinic you have any questions or concerns. The clinic phone number is [phone].   I have been informed and understand all the instructions given to me. I know to contact the clinic, my physician, or go to the Emergency Department if any problems should occur. I do not  have any questions at this time, but understand that I may call the clinic during office hours or the Patient Navigator at [phone] should I have any questions or need assistance in obtaining follow up care.    __________________________________________  _____________  __________ Signature of Patient or Authorized Representative            Date                   Time    __________________________________________ Nurse's Signature

## 2014-01-23 NOTE — Progress Notes (Signed)
[facility]  OFFICE PROGRESS [name], MD [address] [place] [address]  DIAGNOSIS: Invasive ductal carcinoma of left breast  Chief Complaint  Patient presents with  . Triple negative breast cancer    TAC cycle 2 today    CURRENT THERAPY: TAC with Neulasta support beginning 12/31/2013  INTERVAL HISTORY: Sally [name] 58 y.o. female returns for followup and continuation of adjuvant therapy for stage I triple negative left breast cancer, status post lumpectomy on 11/12/2013 with no nodes positive, triple negative. Adjustments in the time interval between cycles will be made in order to accommodate the patient's employment. She developed thrush in conjunction with her last treatment resulting in some nausea. She was also constipated for antinauseants. She denied fever, chills, peripheral paresthesias, diarrhea, melena, hematochezia, hematuria, epistaxis, chest pain, PND, orthopnea, palpitations, or seizures.  MEDICAL HISTORY: Past Medical History  Diagnosis Date  . Chronic sinusitis   . Hypertension   . Diabetes mellitus without complication     diet controlled  . Cancer 11/12/2013    left breast cancer  . Invasive ductal carcinoma of left breast 12/11/2013    INTERIM HISTORY: has GANGLION-HAND/WRIST and Invasive ductal carcinoma of left breast on her problem list.   Stage I Invasive Ductal Carcinoma, triple negative, S/P Lumpectomy on 11/12/2013 by Dr. Arnoldo Morale with 0/1 positive lymph nodes. Now on TAC chemotherapy with Neulasta support beginning on 12/31/2013  ALLERGIES:  has No Known Allergies.  MEDICATIONS: has a current medication list which includes the following prescription(s): atorvastatin, dexamethasone, first-dukes mouthwash, hydrocodone-acetaminophen, ibuprofen, lidocaine-prilocaine, loratadine, magnesium hydroxide, metformin, metoclopramide, prochlorperazine, and triamterene-hydrochlorothiazide, and  the following Facility-Administered Medications: sodium chloride, cyclophosphamide (CYTOXAN) 860 mg in sodium chloride 0.9 % 250 mL chemo infusion, DOCEtaxel (TAXOTERE) 130 mg in dextrose 5 % 250 mL chemo infusion, doxorubicin, fosaprepitant (EMEND) 150 mg, dexamethasone (DECADRON) 12 mg in sodium chloride 0.9 % 145 mL IVPB, heparin lock flush, palonosetron, and sodium chloride.  SURGICAL HISTORY:  Past Surgical History  Procedure Laterality Date  . Ectopic pregnancy surgery    . Abdominal hysterectomy    . Left leg surgery      17 yrs of age  . Ganglion cyst excision    . Dilation and curettage of uterus    . Colonoscopy  01/09/2012    Procedure: COLONOSCOPY;  Surgeon: Dorothyann Peng, MD;  [facility];  Service: Endoscopy;  Laterality: N/A;  1:30 pm  . Inguinal hernia repair Left   . Partial mastectomy with needle localization and axillary sentinel lymph node bx Left 11/12/2013    Procedure: PARTIAL MASTECTOMY WITH NEEDLE LOCALIZATION AND AXILLARY SENTINEL LYMPH NODE BX;  Surgeon: Jamesetta So, MD;  Location: [facility];  Service: General;  Laterality: Left;  . Portacath placement Right 12/29/2013    Procedure: INSERTION PORT-A-CATH;  Surgeon: Jamesetta So, MD;  Location: [facility];  Service: General;  Laterality: Right;    FAMILY HISTORY: family history includes Diabetes type II in her father and mother; Prostate cancer in her father.  SOCIAL HISTORY:  reports that she has been smoking Cigarettes.  She has a 3.75 pack-year smoking history. She has never used smokeless tobacco. She reports that she drinks alcohol. She reports that she does not use illicit drugs.  REVIEW OF SYSTEMS:  Other than that discussed above is noncontributory.  PHYSICAL EXAMINATION: ECOG PERFORMANCE STATUS: 1 - Symptomatic but completely ambulatory  There were no vitals taken  for this visit.  GENERAL:alert, no distress and comfortable. Alopecia. SKIN: skin color, texture, turgor are normal, no rashes or  significant lesions EYES: PERLA; Conjunctiva are pink and non-injected, sclera clear OROPHARYNX:no exudate, no erythema on lips, buccal mucosa, or tongue. NECK: supple, thyroid normal size, non-tender, without nodularity. No masses CHEST: Status post left breast lumpectomy with no masses. Right breast without mass. Right anterior chest life port in place. LYMPH:  no palpable lymphadenopathy in the cervical, axillary or inguinal LUNGS: clear to auscultation and percussion with normal breathing effort HEART: regular rate & rhythm and no murmurs. ABDOMEN:abdomen soft, non-tender and normal bowel sounds MUSCULOSKELETAL:no cyanosis of digits and no clubbing. Range of motion normal.  NEURO: alert & oriented x 3 with fluent speech, no focal motor/sensory deficits   LABORATORY DATA: Infusion on 01/23/2014  Component Date Value Range Status  . WBC 01/23/2014 12.5* 4.0 - 10.5 K/uL Final  . RBC 01/23/2014 3.86* 3.87 - 5.11 MIL/uL Final  . Hemoglobin 01/23/2014 11.9* 12.0 - 15.0 g/dL Final  . HCT 01/23/2014 34.2* 36.0 - 46.0 % Final  . MCV 01/23/2014 88.6  78.0 - 100.0 fL Final  . MCH 01/23/2014 30.8  26.0 - 34.0 pg Final  . MCHC 01/23/2014 34.8  30.0 - 36.0 g/dL Final  . RDW 01/23/2014 13.0  11.5 - 15.5 % Final  . Platelets 01/23/2014 277  150 - 400 K/uL Final  . Neutrophils Relative % 01/23/2014 86* 43 - 77 % Final  . Neutro Abs 01/23/2014 10.7* 1.7 - 7.7 K/uL Final  . Lymphocytes Relative 01/23/2014 8* 12 - 46 % Final  . Lymphs Abs 01/23/2014 1.0  0.7 - 4.0 K/uL Final  . Monocytes Relative 01/23/2014 6  3 - 12 % Final  . Monocytes Absolute 01/23/2014 0.8  0.1 - 1.0 K/uL Final  . Eosinophils Relative 01/23/2014 0  0 - 5 % Final  . Eosinophils Absolute 01/23/2014 0.0  0.0 - 0.7 K/uL Final  . Basophils Relative 01/23/2014 0  0 - 1 % Final  . Basophils Absolute 01/23/2014 0.0  0.0 - 0.1 K/uL Final  . Sodium 01/23/2014 139  137 - 147 mEq/L Final  . Potassium 01/23/2014 4.1  3.7 - 5.3 mEq/L  Final  . Chloride 01/23/2014 99  96 - 112 mEq/L Final  . CO2 01/23/2014 27  19 - 32 mEq/L Final  . Glucose, Bld 01/23/2014 149* 70 - 99 mg/dL Final  . BUN 01/23/2014 21  6 - 23 mg/dL Final  . Creatinine, Ser 01/23/2014 0.93  0.50 - 1.10 mg/dL Final  . Calcium 01/23/2014 10.4  8.4 - 10.5 mg/dL Final  . Total Protein 01/23/2014 7.6  6.0 - 8.3 g/dL Final  . Albumin 01/23/2014 4.0  3.5 - 5.2 g/dL Final  . AST 01/23/2014 20  0 - 37 U/L Final  . ALT 01/23/2014 29  0 - 35 U/L Final  . Alkaline Phosphatase 01/23/2014 139* 39 - 117 U/L Final  . Total Bilirubin 01/23/2014 0.2* 0.3 - 1.2 mg/dL Final  . GFR calc non Af Amer 01/23/2014 68* >90 mL/min Final  . GFR calc Af Amer 01/23/2014 79* >90 mL/min Final   Comment: (NOTE)                          The eGFR has been calculated using the CKD EPI equation.  This calculation has not been validated in all clinical situations.                          eGFR's persistently <90 mL/min signify possible Chronic Kidney                          Disease.  Infusion on 12/31/2013  Component Date Value Range Status  . WBC 12/31/2013 10.7* 4.0 - 10.5 K/uL Final  . RBC 12/31/2013 4.22  3.87 - 5.11 MIL/uL Final  . Hemoglobin 12/31/2013 13.0  12.0 - 15.0 g/dL Final  . HCT 12/31/2013 37.5  36.0 - 46.0 % Final  . MCV 12/31/2013 88.9  78.0 - 100.0 fL Final  . MCH 12/31/2013 30.8  26.0 - 34.0 pg Final  . MCHC 12/31/2013 34.7  30.0 - 36.0 g/dL Final  . RDW 12/31/2013 12.7  11.5 - 15.5 % Final  . Platelets 12/31/2013 226  150 - 400 K/uL Final  . Neutrophils Relative % 12/31/2013 89* 43 - 77 % Final  . Neutro Abs 12/31/2013 9.6* 1.7 - 7.7 K/uL Final  . Lymphocytes Relative 12/31/2013 9* 12 - 46 % Final  . Lymphs Abs 12/31/2013 0.9  0.7 - 4.0 K/uL Final  . Monocytes Relative 12/31/2013 2* 3 - 12 % Final  . Monocytes Absolute 12/31/2013 0.2  0.1 - 1.0 K/uL Final  . Eosinophils Relative 12/31/2013 0  0 - 5 % Final  . Eosinophils Absolute  12/31/2013 0.0  0.0 - 0.7 K/uL Final  . Basophils Relative 12/31/2013 0  0 - 1 % Final  . Basophils Absolute 12/31/2013 0.0  0.0 - 0.1 K/uL Final  . Sodium 12/31/2013 136* 137 - 147 mEq/L Final  . Potassium 12/31/2013 4.2  3.7 - 5.3 mEq/L Final  . Chloride 12/31/2013 95* 96 - 112 mEq/L Final  . CO2 12/31/2013 27  19 - 32 mEq/L Final  . Glucose, Bld 12/31/2013 194* 70 - 99 mg/dL Final  . BUN 12/31/2013 21  6 - 23 mg/dL Final  . Creatinine, Ser 12/31/2013 0.86  0.50 - 1.10 mg/dL Final  . Calcium 12/31/2013 10.5  8.4 - 10.5 mg/dL Final  . Total Protein 12/31/2013 8.9* 6.0 - 8.3 g/dL Final  . Albumin 12/31/2013 4.5  3.5 - 5.2 g/dL Final  . AST 12/31/2013 24  0 - 37 U/L Final  . ALT 12/31/2013 31  0 - 35 U/L Final  . Alkaline Phosphatase 12/31/2013 196* 39 - 117 U/L Final  . Total Bilirubin 12/31/2013 0.3  0.3 - 1.2 mg/dL Final  . GFR calc non Af Amer 12/31/2013 75* >90 mL/min Final  . GFR calc Af Amer 12/31/2013 87* >90 mL/min Final   Comment: (NOTE)                          The eGFR has been calculated using the CKD EPI equation.                          This calculation has not been validated in all clinical situations.                          eGFR's persistently <90 mL/min signify possible Chronic Kidney  Disease.  Marland Kitchen CA 27.29 12/31/2013 53* 0 - 39 U/mL Final   Performed at [facility]  Admission on 12/29/2013, Discharged on 12/29/2013  Component Date Value Range Status  . Glucose-Capillary 12/29/2013 97  70 - 99 mg/dL Final  . Glucose-Capillary 12/29/2013 96  70 - 99 mg/dL Final    PATHOLOGY: No new pathology.  Urinalysis No results found for this basename: colorurine,  appearanceur,  labspec,  phurine,  glucoseu,  hgbur,  bilirubinur,  ketonesur,  proteinur,  urobilinogen,  nitrite,  leukocytesur    RADIOGRAPHIC STUDIES: Dg Chest Port 1 View  12/29/2013   CLINICAL DATA:  Status post port placement  EXAM: PORTABLE CHEST - 1 VIEW  COMPARISON:   None.  FINDINGS: Cardiac shadow is within normal limits. A right-sided chest wall port is seen with the catheter tip in the mid superior vena cava. No pneumothorax is seen. No bony abnormality is seen.  IMPRESSION: No pneumothorax following port placement   Electronically Signed   By: Inez Catalina M.D.   On: 12/29/2013 12:31   Dg C-arm 1-60 Min-no Report  12/29/2013   CLINICAL DATA: breast cancer   C-ARM 1-60 MINUTES  Fluoroscopy was utilized by the requesting physician.  No radiographic  interpretation.     ASSESSMENT:  #1. Stage I triple-negative left breast cancer, status post lumpectomy and sentinel node biopsy, for cycle 2 of chemotherapy today. #2. Diabetes mellitus, type II, made worse with dexamethasone premedications.   PLAN:  #1. Dietary consult. #2. Increase metformin to 1000 mg twice a day for the 3 days that dexamethasone is taken. #3. Prophylactic Dukes mouthwash for 4 days following each treatment. #4. TAC cycle #2 today. #5. Return for Neulasta subcutaneously tomorrow. #6. Followup in 3 weeks for cycle #3.   All questions were answered. The patient knows to call the clinic with any problems, questions or concerns. We can certainly see the patient much sooner if necessary.   I spent 25 minutes counseling the patient face to face. The total time spent in the appointment was 30 minutes.    Doroteo Bradford, MD 01/23/2014 9:57 AM

## 2014-01-24 ENCOUNTER — Encounter (HOSPITAL_BASED_OUTPATIENT_CLINIC_OR_DEPARTMENT_OTHER): Payer: BC Managed Care – PPO

## 2014-01-24 VITALS — BP 113/73 | HR 89 | Temp 97.4°F | Resp 18

## 2014-01-24 DIAGNOSIS — C50419 Malignant neoplasm of upper-outer quadrant of unspecified female breast: Secondary | ICD-10-CM

## 2014-01-24 DIAGNOSIS — C50912 Malignant neoplasm of unspecified site of left female breast: Secondary | ICD-10-CM

## 2014-01-24 DIAGNOSIS — Z5189 Encounter for other specified aftercare: Secondary | ICD-10-CM

## 2014-01-24 MED ORDER — PEGFILGRASTIM INJECTION 6 MG/0.6ML
SUBCUTANEOUS | Status: AC
  Filled 2014-01-24: qty 0.6

## 2014-01-24 MED ORDER — PEGFILGRASTIM INJECTION 6 MG/0.6ML
6.0000 mg | Freq: Once | SUBCUTANEOUS | Status: AC
  Administered 2014-01-24: 6 mg via SUBCUTANEOUS

## 2014-01-24 NOTE — Progress Notes (Signed)
Sally [name] presents today for injection per MD orders. Neulasta 6mg  administered SQ in right Abdomen. Administration without incident. Patient tolerated well and reports no problems post chemo

## 2014-02-16 ENCOUNTER — Encounter (HOSPITAL_COMMUNITY): Payer: Self-pay

## 2014-02-16 ENCOUNTER — Encounter (HOSPITAL_BASED_OUTPATIENT_CLINIC_OR_DEPARTMENT_OTHER): Payer: BC Managed Care – PPO

## 2014-02-16 ENCOUNTER — Encounter (HOSPITAL_COMMUNITY): Payer: BC Managed Care – PPO | Attending: Hematology and Oncology

## 2014-02-16 VITALS — BP 136/82 | HR 83 | Temp 97.7°F | Resp 16 | Wt 156.2 lb

## 2014-02-16 DIAGNOSIS — R209 Unspecified disturbances of skin sensation: Secondary | ICD-10-CM

## 2014-02-16 DIAGNOSIS — R55 Syncope and collapse: Secondary | ICD-10-CM | POA: Insufficient documentation

## 2014-02-16 DIAGNOSIS — J310 Chronic rhinitis: Secondary | ICD-10-CM

## 2014-02-16 DIAGNOSIS — C50912 Malignant neoplasm of unspecified site of left female breast: Secondary | ICD-10-CM

## 2014-02-16 DIAGNOSIS — C50919 Malignant neoplasm of unspecified site of unspecified female breast: Secondary | ICD-10-CM | POA: Insufficient documentation

## 2014-02-16 DIAGNOSIS — C50419 Malignant neoplasm of upper-outer quadrant of unspecified female breast: Secondary | ICD-10-CM

## 2014-02-16 DIAGNOSIS — Z171 Estrogen receptor negative status [ER-]: Secondary | ICD-10-CM

## 2014-02-16 DIAGNOSIS — Z5111 Encounter for antineoplastic chemotherapy: Secondary | ICD-10-CM

## 2014-02-16 DIAGNOSIS — E119 Type 2 diabetes mellitus without complications: Secondary | ICD-10-CM

## 2014-02-16 LAB — CBC WITH DIFFERENTIAL/PLATELET
BASOS PCT: 0 % (ref 0–1)
Basophils Absolute: 0 10*3/uL (ref 0.0–0.1)
Eosinophils Absolute: 0 10*3/uL (ref 0.0–0.7)
Eosinophils Relative: 0 % (ref 0–5)
HCT: 35.1 % — ABNORMAL LOW (ref 36.0–46.0)
HEMOGLOBIN: 12 g/dL (ref 12.0–15.0)
Lymphocytes Relative: 8 % — ABNORMAL LOW (ref 12–46)
Lymphs Abs: 0.8 10*3/uL (ref 0.7–4.0)
MCH: 31.1 pg (ref 26.0–34.0)
MCHC: 34.2 g/dL (ref 30.0–36.0)
MCV: 90.9 fL (ref 78.0–100.0)
MONOS PCT: 9 % (ref 3–12)
Monocytes Absolute: 0.9 10*3/uL (ref 0.1–1.0)
Neutro Abs: 8.4 10*3/uL — ABNORMAL HIGH (ref 1.7–7.7)
Neutrophils Relative %: 84 % — ABNORMAL HIGH (ref 43–77)
PLATELETS: 307 10*3/uL (ref 150–400)
RBC: 3.86 MIL/uL — ABNORMAL LOW (ref 3.87–5.11)
RDW: 15.3 % (ref 11.5–15.5)
WBC: 10 10*3/uL (ref 4.0–10.5)

## 2014-02-16 LAB — COMPREHENSIVE METABOLIC PANEL
ALK PHOS: 147 U/L — AB (ref 39–117)
ALT: 33 U/L (ref 0–35)
AST: 21 U/L (ref 0–37)
Albumin: 4.3 g/dL (ref 3.5–5.2)
BUN: 19 mg/dL (ref 6–23)
CO2: 26 mEq/L (ref 19–32)
Calcium: 10.2 mg/dL (ref 8.4–10.5)
Chloride: 100 mEq/L (ref 96–112)
Creatinine, Ser: 0.84 mg/dL (ref 0.50–1.10)
GFR calc Af Amer: 89 mL/min — ABNORMAL LOW (ref >90)
GFR calc non Af Amer: 77 mL/min — ABNORMAL LOW (ref >90)
Glucose, Bld: 183 mg/dL — ABNORMAL HIGH (ref 70–99)
Potassium: 4.3 mEq/L (ref 3.7–5.3)
Sodium: 138 mEq/L (ref 137–147)
Total Bilirubin: 0.3 mg/dL (ref 0.3–1.2)
Total Protein: 7.5 g/dL (ref 6.0–8.3)

## 2014-02-16 LAB — CANCER ANTIGEN 27.29: CA 27.29: 35 U/mL (ref 0–39)

## 2014-02-16 MED ORDER — PALONOSETRON HCL INJECTION 0.25 MG/5ML
0.2500 mg | Freq: Once | INTRAVENOUS | Status: AC
  Administered 2014-02-16: 0.25 mg via INTRAVENOUS

## 2014-02-16 MED ORDER — SODIUM CHLORIDE 0.9 % IV SOLN: 150.0000 mg | Freq: Once | INTRAVENOUS | Status: DC

## 2014-02-16 MED ORDER — SODIUM CHLORIDE 0.9 % IV SOLN
Freq: Once | INTRAVENOUS | Status: AC
  Administered 2014-02-16: 10:00:00 via INTRAVENOUS

## 2014-02-16 MED ORDER — DEXAMETHASONE SODIUM PHOSPHATE 20 MG/5ML IJ SOLN: 12.0000 mg | Freq: Once | INTRAMUSCULAR | Status: DC

## 2014-02-16 MED ORDER — VENLAFAXINE HCL ER 37.5 MG PO CP24: ORAL_CAPSULE | ORAL | Status: DC

## 2014-02-16 MED ORDER — HEPARIN SOD (PORK) LOCK FLUSH 100 UNIT/ML IV SOLN
INTRAVENOUS | Status: AC
  Filled 2014-02-16: qty 5

## 2014-02-16 MED ORDER — HEPARIN SOD (PORK) LOCK FLUSH 100 UNIT/ML IV SOLN
500.0000 [IU] | Freq: Once | INTRAVENOUS | Status: AC | PRN
  Administered 2014-02-16: 500 [IU]

## 2014-02-16 MED ORDER — SODIUM CHLORIDE 0.9 % IV SOLN
500.0000 mg/m2 | Freq: Once | INTRAVENOUS | Status: AC
  Administered 2014-02-16: 860 mg via INTRAVENOUS
  Filled 2014-02-16: qty 43

## 2014-02-16 MED ORDER — SODIUM CHLORIDE 0.9 % IJ SOLN: 10.0000 mL | INTRAMUSCULAR | Status: DC | PRN

## 2014-02-16 MED ORDER — PALONOSETRON HCL INJECTION 0.25 MG/5ML
INTRAVENOUS | Status: AC
  Filled 2014-02-16: qty 5

## 2014-02-16 MED ORDER — SODIUM CHLORIDE 0.9 % IV SOLN
Freq: Once | INTRAVENOUS | Status: AC
  Administered 2014-02-16: 10:00:00 via INTRAVENOUS
  Filled 2014-02-16: qty 5

## 2014-02-16 MED ORDER — DOXORUBICIN HCL CHEMO IV INJECTION 2 MG/ML
50.0000 mg/m2 | Freq: Once | INTRAVENOUS | Status: AC
  Administered 2014-02-16: 86 mg via INTRAVENOUS
  Filled 2014-02-16: qty 43

## 2014-02-16 MED ORDER — DOCETAXEL CHEMO INJECTION 160 MG/16ML
75.0000 mg/m2 | Freq: Once | INTRAVENOUS | Status: AC
  Administered 2014-02-16: 130 mg via INTRAVENOUS
  Filled 2014-02-16: qty 13

## 2014-02-16 MED ORDER — FLUTICASONE PROPIONATE 50 MCG/ACT NA SUSP: 2.0000 | Freq: Every day | NASAL | Status: DC

## 2014-02-16 NOTE — Patient Instructions (Signed)
[facility] Discharge Instructions  RECOMMENDATIONS MADE BY THE CONSULTANT AND ANY TEST RESULTS WILL BE SENT TO YOUR REFERRING PHYSICIAN.  EXAM FINDINGS BY THE PHYSICIAN TODAY AND SIGNS OR SYMPTOMS TO REPORT TO CLINIC OR PRIMARY PHYSICIAN: Exam and findings as discussed by Dr. Barnet Glasgow.  Report uncontrolled nausea or vomiting, any new lumps, bone pain or shortness of breath.  MEDICATIONS PRESCRIBED:  Venlafaxine (effexir) - can take 1 or 2 at bedtime for hot flashes Flonase - use as directed.   INSTRUCTIONS/FOLLOW-UP: Follow-up in 3 weeks with chemotherapy and MD visit.  Thank you for choosing [facility] to provide your oncology and hematology care.  To afford each patient quality time with our providers, please arrive at least 15 minutes before your scheduled appointment time.  With your help, our goal is to use those 15 minutes to complete the necessary work-up to ensure our physicians have the information they need to help with your evaluation and healthcare recommendations.    Effective January 1st, 2014, we ask that you re-schedule your appointment with our physicians should you arrive 10 or more minutes late for your appointment.  We strive to give you quality time with our providers, and arriving late affects you and other patients whose appointments are after yours.    Again, thank you for choosing [facility].  Our hope is that these requests will decrease the amount of time that you wait before being seen by our physicians.       _____________________________________________________________  Should you have questions after your visit to [facility], please contact our office at [phone] between the hours of 8:30 a.m. and 5:00 p.m.  Voicemails left after 4:30 p.m. will not be returned until the following business day.  For prescription refill requests, have your pharmacy contact our office with your prescription refill  request.

## 2014-02-16 NOTE — Progress Notes (Signed)
Sally [name] tolerated infusions well and without incident; verbalizes understanding for follow-up.  No distress noted at time of discharge and patient was discharged home with a friend.

## 2014-02-16 NOTE — Progress Notes (Signed)
[place]  OFFICE PROGRESS [name], MD [address] [place] [address]  DIAGNOSIS: Invasive ductal carcinoma of left breast  Vasomotor instability  Chief Complaint  Patient presents with  . Breast Cancer    Cycle 3 of TAC    CURRENT THERAPY: TAC with Neulasta support beginning 12/31/2013 for cycle #3 today  INTERVAL HISTORY: Sally [name] 58 y.o. female returns for continuation of adjuvant therapy for her stage I triple-negative left breast cancer, status post lumpectomy in 11/12/2013 with adjustments in the time interval between cycles to be made in order to accommodate the patient's travel requirement. She's had problems with nasal stuffiness as well as hot flashes. She denies any earache, sore throat, cough, wheezing and had no further episodes of thrush while taking Magic mouthwash mouthwash prophylactically. She denies any fever, cough, wheezing, lower extremity swelling or redness, vaginal discharge or dryness, dysuria, hematuria, polyuria, polydipsia, blurred or double vision, skin rash, joint pain, peripheral paresthesias, headache, or seizures.   MEDICAL HISTORY: Past Medical History  Diagnosis Date  . Chronic sinusitis   . Hypertension   . Diabetes mellitus without complication     diet controlled  . Cancer 11/12/2013    left breast cancer  . Invasive ductal carcinoma of left breast 12/11/2013    INTERIM HISTORY: has GANGLION-HAND/WRIST; Invasive ductal carcinoma of left breast; and Vasomotor instability on her problem list.    ALLERGIES:  has No Known Allergies.  MEDICATIONS: has a current medication list which includes the following prescription(s): atorvastatin, dexamethasone, hydrocodone-acetaminophen, lidocaine-prilocaine, loratadine, metformin, metoclopramide, prochlorperazine, triamterene-hydrochlorothiazide, first-dukes mouthwash, fluticasone, ibuprofen, magnesium hydroxide, and  venlafaxine xr, and the following Facility-Administered Medications: cyclophosphamide (CYTOXAN) 860 mg in sodium chloride 0.9 % 250 mL chemo infusion, DOCEtaxel (TAXOTERE) 130 mg in dextrose 5 % 250 mL chemo infusion, doxorubicin, fosaprepitant (EMEND) 150 mg, dexamethasone (DECADRON) 12 mg in sodium chloride 0.9 % 145 mL IVPB, heparin lock flush, palonosetron, and sodium chloride.  SURGICAL HISTORY:  Past Surgical History  Procedure Laterality Date  . Ectopic pregnancy surgery    . Abdominal hysterectomy    . Left leg surgery      17 yrs of age  . Ganglion cyst excision    . Dilation and curettage of uterus    . Colonoscopy  01/09/2012    Procedure: COLONOSCOPY;  Surgeon: Dorothyann Peng, MD;  Location: [facility];  Service: Endoscopy;  Laterality: N/A;  1:30 pm  . Inguinal hernia repair Left   . Partial mastectomy with needle localization and axillary sentinel lymph node bx Left 11/12/2013    Procedure: PARTIAL MASTECTOMY WITH NEEDLE LOCALIZATION AND AXILLARY SENTINEL LYMPH NODE BX;  Surgeon: Jamesetta So, MD;  Location: [facility];  Service: General;  Laterality: Left;  . Portacath placement Right 12/29/2013    Procedure: INSERTION PORT-A-CATH;  Surgeon: Jamesetta So, MD;  Location: [facility];  Service: General;  Laterality: Right;    FAMILY HISTORY: family history includes Diabetes type II in her father and mother; Prostate cancer in her father.  SOCIAL HISTORY:  reports that she has been smoking Cigarettes.  She has a 3.75 pack-year smoking history. She has never used smokeless tobacco. She reports that she drinks alcohol. She reports that she does not use illicit drugs.  REVIEW OF SYSTEMS:  Other than that discussed above is noncontributory.  PHYSICAL EXAMINATION: ECOG PERFORMANCE STATUS: 1 - Symptomatic but completely ambulatory  Blood pressure 136/82, pulse  83, temperature 97.7 F (36.5 C), temperature source Oral, resp. rate 16, weight 156 lb 3.2 oz (70.852 kg).  GENERAL:alert,  no distress and comfortable SKIN: skin color, texture, turgor are normal, no rashes or significant lesions EYES: PERLA; Conjunctiva are pink and non-injected, sclera clear. Bilateral rhinorrhea. No sinus tenderness. OROPHARYNX:no exudate, no erythema on lips, buccal mucosa, or tongue. NECK: supple, thyroid normal size, non-tender, without nodularity. No masses CHEST: Status post left breast lumpectomy with no masses in either breast. LYMPH:  no palpable lymphadenopathy in the cervical, axillary or inguinal LUNGS: clear to auscultation and percussion with normal breathing effort HEART: regular rate & rhythm and no murmurs. ABDOMEN:abdomen soft, non-tender and normal bowel sounds MUSCULOSKELETAL:no cyanosis of digits and no clubbing. Range of motion normal.  NEURO: alert & oriented x 3 with fluent speech, no focal motor/sensory deficits   LABORATORY DATA: Infusion on 02/16/2014  Component Date Value Ref Range Status  . WBC 02/16/2014 10.0  4.0 - 10.5 K/uL Final  . RBC 02/16/2014 3.86* 3.87 - 5.11 MIL/uL Final  . Hemoglobin 02/16/2014 12.0  12.0 - 15.0 g/dL Final  . HCT 02/16/2014 35.1* 36.0 - 46.0 % Final  . MCV 02/16/2014 90.9  78.0 - 100.0 fL Final  . MCH 02/16/2014 31.1  26.0 - 34.0 pg Final  . MCHC 02/16/2014 34.2  30.0 - 36.0 g/dL Final  . RDW 02/16/2014 15.3  11.5 - 15.5 % Final  . Platelets 02/16/2014 307  150 - 400 K/uL Final  . Neutrophils Relative % 02/16/2014 84* 43 - 77 % Final  . Neutro Abs 02/16/2014 8.4* 1.7 - 7.7 K/uL Final  . Lymphocytes Relative 02/16/2014 8* 12 - 46 % Final  . Lymphs Abs 02/16/2014 0.8  0.7 - 4.0 K/uL Final  . Monocytes Relative 02/16/2014 9  3 - 12 % Final  . Monocytes Absolute 02/16/2014 0.9  0.1 - 1.0 K/uL Final  . Eosinophils Relative 02/16/2014 0  0 - 5 % Final  . Eosinophils Absolute 02/16/2014 0.0  0.0 - 0.7 K/uL Final  . Basophils Relative 02/16/2014 0  0 - 1 % Final  . Basophils Absolute 02/16/2014 0.0  0.0 - 0.1 K/uL Final  . Sodium  02/16/2014 138  137 - 147 mEq/L Final  . Potassium 02/16/2014 4.3  3.7 - 5.3 mEq/L Final  . Chloride 02/16/2014 100  96 - 112 mEq/L Final  . CO2 02/16/2014 26  19 - 32 mEq/L Final  . Glucose, Bld 02/16/2014 183* 70 - 99 mg/dL Final  . BUN 02/16/2014 19  6 - 23 mg/dL Final  . Creatinine, Ser 02/16/2014 0.84  0.50 - 1.10 mg/dL Final  . Calcium 02/16/2014 10.2  8.4 - 10.5 mg/dL Final  . Total Protein 02/16/2014 7.5  6.0 - 8.3 g/dL Final  . Albumin 02/16/2014 4.3  3.5 - 5.2 g/dL Final  . AST 02/16/2014 21  0 - 37 U/L Final  . ALT 02/16/2014 33  0 - 35 U/L Final  . Alkaline Phosphatase 02/16/2014 147* 39 - 117 U/L Final  . Total Bilirubin 02/16/2014 0.3  0.3 - 1.2 mg/dL Final  . GFR calc non Af Amer 02/16/2014 77* >90 mL/min Final  . GFR calc Af Amer 02/16/2014 89* >90 mL/min Final   Comment: (NOTE)                          The eGFR has been calculated using the CKD EPI equation.  This calculation has not been validated in all clinical situations.                          eGFR's persistently <90 mL/min signify possible Chronic Kidney                          Disease.  Infusion on 01/23/2014  Component Date Value Ref Range Status  . WBC 01/23/2014 12.5* 4.0 - 10.5 K/uL Final  . RBC 01/23/2014 3.86* 3.87 - 5.11 MIL/uL Final  . Hemoglobin 01/23/2014 11.9* 12.0 - 15.0 g/dL Final  . HCT 01/23/2014 34.2* 36.0 - 46.0 % Final  . MCV 01/23/2014 88.6  78.0 - 100.0 fL Final  . MCH 01/23/2014 30.8  26.0 - 34.0 pg Final  . MCHC 01/23/2014 34.8  30.0 - 36.0 g/dL Final  . RDW 01/23/2014 13.0  11.5 - 15.5 % Final  . Platelets 01/23/2014 277  150 - 400 K/uL Final  . Neutrophils Relative % 01/23/2014 86* 43 - 77 % Final  . Neutro Abs 01/23/2014 10.7* 1.7 - 7.7 K/uL Final  . Lymphocytes Relative 01/23/2014 8* 12 - 46 % Final  . Lymphs Abs 01/23/2014 1.0  0.7 - 4.0 K/uL Final  . Monocytes Relative 01/23/2014 6  3 - 12 % Final  . Monocytes Absolute 01/23/2014 0.8  0.1 - 1.0 K/uL  Final  . Eosinophils Relative 01/23/2014 0  0 - 5 % Final  . Eosinophils Absolute 01/23/2014 0.0  0.0 - 0.7 K/uL Final  . Basophils Relative 01/23/2014 0  0 - 1 % Final  . Basophils Absolute 01/23/2014 0.0  0.0 - 0.1 K/uL Final  . Sodium 01/23/2014 139  137 - 147 mEq/L Final  . Potassium 01/23/2014 4.1  3.7 - 5.3 mEq/L Final  . Chloride 01/23/2014 99  96 - 112 mEq/L Final  . CO2 01/23/2014 27  19 - 32 mEq/L Final  . Glucose, Bld 01/23/2014 149* 70 - 99 mg/dL Final  . BUN 01/23/2014 21  6 - 23 mg/dL Final  . Creatinine, Ser 01/23/2014 0.93  0.50 - 1.10 mg/dL Final  . Calcium 01/23/2014 10.4  8.4 - 10.5 mg/dL Final  . Total Protein 01/23/2014 7.6  6.0 - 8.3 g/dL Final  . Albumin 01/23/2014 4.0  3.5 - 5.2 g/dL Final  . AST 01/23/2014 20  0 - 37 U/L Final  . ALT 01/23/2014 29  0 - 35 U/L Final  . Alkaline Phosphatase 01/23/2014 139* 39 - 117 U/L Final  . Total Bilirubin 01/23/2014 0.2* 0.3 - 1.2 mg/dL Final  . GFR calc non Af Amer 01/23/2014 68* >90 mL/min Final  . GFR calc Af Amer 01/23/2014 79* >90 mL/min Final   Comment: (NOTE)                          The eGFR has been calculated using the CKD EPI equation.                          This calculation has not been validated in all clinical situations.                          eGFR's persistently <90 mL/min signify possible Chronic Kidney  Disease.    PATHOLOGY: No new pathology.  Urinalysis No results found for this basename: colorurine,  appearanceur,  labspec,  phurine,  glucoseu,  hgbur,  bilirubinur,  ketonesur,  proteinur,  urobilinogen,  nitrite,  leukocytesur    RADIOGRAPHIC STUDIES: No results found.  ASSESSMENT:  #1. Stage I triple-negative left breast cancer, status post lumpectomy and sentinel node biopsy, for cycle 2 of chemotherapy today.  #2. Diabetes mellitus, type II, made worse with dexamethasone premedications, improved on higher dose of metformin during the time dexamethasone as  taken. #3. Vasomotor instability. #4. Perennial rhinitis.    PLAN:  #1. Cycle 3 of TAC today followed by Neulasta tomorrow. Magic mouthwash prophylactically. #2. Flonase inhaler 2 puffs in each nostril at bedtime. Continue Claritin 10 mg daily. #3. Venlafaxine XR 37.5 mg at bedtime for 4 days and if hot flashes are not controlled, increase dose to 75 mg. #4. Followup in 3 weeks to receive cycle #4 of treatment. Timing may not be exact based on patient's traveling needs.   All questions were answered. The patient knows to call the clinic with any problems, questions or concerns. We can certainly see the patient much sooner if necessary.   I spent 25 minutes counseling the patient face to face. The total time spent in the appointment was 30 minutes.    Doroteo Bradford, MD 02/16/2014 10:23 AM

## 2014-02-17 ENCOUNTER — Ambulatory Visit (HOSPITAL_COMMUNITY): Payer: BC Managed Care – PPO

## 2014-02-17 ENCOUNTER — Encounter (HOSPITAL_BASED_OUTPATIENT_CLINIC_OR_DEPARTMENT_OTHER): Payer: BC Managed Care – PPO

## 2014-02-17 VITALS — BP 132/85 | HR 74 | Temp 97.4°F | Resp 20

## 2014-02-17 DIAGNOSIS — C50419 Malignant neoplasm of upper-outer quadrant of unspecified female breast: Secondary | ICD-10-CM

## 2014-02-17 DIAGNOSIS — C50912 Malignant neoplasm of unspecified site of left female breast: Secondary | ICD-10-CM

## 2014-02-17 DIAGNOSIS — Z5189 Encounter for other specified aftercare: Secondary | ICD-10-CM

## 2014-02-17 MED ORDER — PEGFILGRASTIM INJECTION 6 MG/0.6ML
6.0000 mg | Freq: Once | SUBCUTANEOUS | Status: AC
  Administered 2014-02-17: 6 mg via SUBCUTANEOUS

## 2014-02-17 MED ORDER — PEGFILGRASTIM INJECTION 6 MG/0.6ML
SUBCUTANEOUS | Status: AC
  Filled 2014-02-17: qty 0.6

## 2014-02-17 NOTE — Progress Notes (Signed)
Sally [name] presents today for injection per MD orders. Neulasta 6mg administered SQ in right Abdomen. Administration without incident. Patient tolerated well.  

## 2014-02-25 ENCOUNTER — Other Ambulatory Visit (HOSPITAL_COMMUNITY): Payer: Self-pay | Admitting: Oncology

## 2014-02-25 ENCOUNTER — Encounter (HOSPITAL_COMMUNITY): Payer: Self-pay

## 2014-02-25 ENCOUNTER — Encounter (HOSPITAL_COMMUNITY)
Admission: RE | Admit: 2014-02-25 | Discharge: 2014-02-25 | Disposition: A | Discharge status: Home / Self Care | Payer: BC Managed Care – PPO | Source: Ambulatory Visit | Attending: Oncology | Admitting: Oncology

## 2014-02-25 DIAGNOSIS — Z79899 Other long term (current) drug therapy: Secondary | ICD-10-CM | POA: Insufficient documentation

## 2014-02-25 DIAGNOSIS — C50919 Malignant neoplasm of unspecified site of unspecified female breast: Secondary | ICD-10-CM | POA: Insufficient documentation

## 2014-02-25 DIAGNOSIS — C50912 Malignant neoplasm of unspecified site of left female breast: Secondary | ICD-10-CM

## 2014-02-25 MED ORDER — HEPARIN SOD (PORK) LOCK FLUSH 100 UNIT/ML IV SOLN
INTRAVENOUS | Status: AC
  Filled 2014-02-25: qty 5

## 2014-02-25 MED ORDER — TECHNETIUM TC 99M-LABELED RED BLOOD CELLS IV KIT
25.0000 | PACK | Freq: Once | INTRAVENOUS | Status: AC | PRN
  Administered 2014-02-25: 25 via INTRAVENOUS

## 2014-03-09 ENCOUNTER — Other Ambulatory Visit (HOSPITAL_COMMUNITY): Payer: Self-pay | Admitting: Hematology and Oncology

## 2014-03-09 ENCOUNTER — Encounter (HOSPITAL_COMMUNITY): Payer: BC Managed Care – PPO | Attending: Hematology and Oncology

## 2014-03-09 ENCOUNTER — Encounter (HOSPITAL_COMMUNITY): Payer: Self-pay

## 2014-03-09 ENCOUNTER — Encounter (HOSPITAL_BASED_OUTPATIENT_CLINIC_OR_DEPARTMENT_OTHER): Payer: BC Managed Care – PPO

## 2014-03-09 VITALS — BP 141/85 | HR 68 | Temp 96.8°F | Resp 18 | Wt 157.8 lb

## 2014-03-09 VITALS — BP 143/93 | HR 84 | Temp 97.0°F | Resp 16 | Wt 157.8 lb

## 2014-03-09 DIAGNOSIS — I519 Heart disease, unspecified: Secondary | ICD-10-CM

## 2014-03-09 DIAGNOSIS — C50919 Malignant neoplasm of unspecified site of unspecified female breast: Secondary | ICD-10-CM | POA: Insufficient documentation

## 2014-03-09 DIAGNOSIS — C50419 Malignant neoplasm of upper-outer quadrant of unspecified female breast: Secondary | ICD-10-CM

## 2014-03-09 DIAGNOSIS — C50912 Malignant neoplasm of unspecified site of left female breast: Secondary | ICD-10-CM

## 2014-03-09 DIAGNOSIS — Z5111 Encounter for antineoplastic chemotherapy: Secondary | ICD-10-CM

## 2014-03-09 DIAGNOSIS — Z171 Estrogen receptor negative status [ER-]: Secondary | ICD-10-CM

## 2014-03-09 LAB — COMPREHENSIVE METABOLIC PANEL
ALBUMIN: 4.3 g/dL (ref 3.5–5.2)
ALT: 22 U/L (ref 0–35)
AST: 19 U/L (ref 0–37)
Alkaline Phosphatase: 125 U/L — ABNORMAL HIGH (ref 39–117)
BILIRUBIN TOTAL: 0.3 mg/dL (ref 0.3–1.2)
BUN: 17 mg/dL (ref 6–23)
CO2: 26 mEq/L (ref 19–32)
CREATININE: 0.76 mg/dL (ref 0.50–1.10)
Calcium: 10.2 mg/dL (ref 8.4–10.5)
Chloride: 102 mEq/L (ref 96–112)
GFR calc Af Amer: >90 mL/min (ref >90)
GFR calc non Af Amer: >90 mL/min (ref >90)
Glucose, Bld: 186 mg/dL — ABNORMAL HIGH (ref 70–99)
POTASSIUM: 4.1 meq/L (ref 3.7–5.3)
Sodium: 140 mEq/L (ref 137–147)
Total Protein: 7.3 g/dL (ref 6.0–8.3)

## 2014-03-09 LAB — CBC WITH DIFFERENTIAL/PLATELET
BASOS ABS: 0 10*3/uL (ref 0.0–0.1)
Basophils Relative: 0 % (ref 0–1)
Eosinophils Absolute: 0 10*3/uL (ref 0.0–0.7)
Eosinophils Relative: 0 % (ref 0–5)
HCT: 32.6 % — ABNORMAL LOW (ref 36.0–46.0)
Hemoglobin: 11.1 g/dL — ABNORMAL LOW (ref 12.0–15.0)
Lymphocytes Relative: 4 % — ABNORMAL LOW (ref 12–46)
Lymphs Abs: 0.5 10*3/uL — ABNORMAL LOW (ref 0.7–4.0)
MCH: 31.1 pg (ref 26.0–34.0)
MCHC: 34 g/dL (ref 30.0–36.0)
MCV: 91.3 fL (ref 78.0–100.0)
MONO ABS: 0.7 10*3/uL (ref 0.1–1.0)
Monocytes Relative: 6 % (ref 3–12)
NEUTROS ABS: 10 10*3/uL — AB (ref 1.7–7.7)
NEUTROS PCT: 89 % — AB (ref 43–77)
Platelets: 255 10*3/uL (ref 150–400)
RBC: 3.57 MIL/uL — ABNORMAL LOW (ref 3.87–5.11)
RDW: 16.5 % — AB (ref 11.5–15.5)
WBC: 11.2 10*3/uL — ABNORMAL HIGH (ref 4.0–10.5)

## 2014-03-09 MED ORDER — PALONOSETRON HCL INJECTION 0.25 MG/5ML
INTRAVENOUS | Status: AC
  Filled 2014-03-09: qty 5

## 2014-03-09 MED ORDER — PALONOSETRON HCL INJECTION 0.25 MG/5ML
0.2500 mg | Freq: Once | INTRAVENOUS | Status: AC
  Administered 2014-03-09: 0.25 mg via INTRAVENOUS

## 2014-03-09 MED ORDER — HEPARIN SOD (PORK) LOCK FLUSH 100 UNIT/ML IV SOLN
500.0000 [IU] | Freq: Once | INTRAVENOUS | Status: AC | PRN
Start: 2014-03-09 — End: 2014-03-09
  Administered 2014-03-09: 500 [IU]
  Filled 2014-03-09: qty 5

## 2014-03-09 MED ORDER — FOSAPREPITANT DIMEGLUMINE INJECTION 150 MG
Freq: Once | INTRAVENOUS | Status: AC
  Administered 2014-03-09: 12:00:00 via INTRAVENOUS
  Filled 2014-03-09: qty 5

## 2014-03-09 MED ORDER — SODIUM CHLORIDE 0.9 % IV SOLN
Freq: Once | INTRAVENOUS | Status: AC
Start: 2014-03-09 — End: 2014-03-09
  Administered 2014-03-09: 11:00:00 via INTRAVENOUS

## 2014-03-09 MED ORDER — SODIUM CHLORIDE 0.9 % IV SOLN: 150.0000 mg | Freq: Once | INTRAVENOUS | Status: DC

## 2014-03-09 MED ORDER — SODIUM CHLORIDE 0.9 % IV SOLN
600.0000 mg/m2 | Freq: Once | INTRAVENOUS | Status: AC
  Administered 2014-03-09: 1040 mg via INTRAVENOUS
  Filled 2014-03-09: qty 50

## 2014-03-09 MED ORDER — SODIUM CHLORIDE 0.9 % IJ SOLN
10.0000 mL | INTRAMUSCULAR | Status: DC | PRN
  Administered 2014-03-09: 10 mL

## 2014-03-09 MED ORDER — DEXAMETHASONE SODIUM PHOSPHATE 20 MG/5ML IJ SOLN: 12.0000 mg | Freq: Once | INTRAMUSCULAR | Status: DC

## 2014-03-09 MED ORDER — DOCETAXEL CHEMO INJECTION 160 MG/16ML
75.0000 mg/m2 | Freq: Once | INTRAVENOUS | Status: AC
  Administered 2014-03-09: 130 mg via INTRAVENOUS
  Filled 2014-03-09: qty 13

## 2014-03-09 NOTE — Progress Notes (Signed)
Sally [name]  OFFICE PROGRESS Sally Ewing, MD [address] [place] [address]  DIAGNOSIS: Invasive ductal carcinoma of left breast  Left ventricular dysfunction  Chief Complaint  Patient presents with  . Follow-up  . Breast Cancer    left  . Decreased ejection fraction on MUGA scan    CURRENT THERAPY: TAC adjuvant chemotherapy planning to receive cycle #4 of a planned 6 today.k  INTERVAL HISTORY: Sally [name] 58 y.o. female returns for followup and continuation of chemotherapy for stage I triple-negative left breast cancer, status post lumpectomy on 11/12/2013 with treatment intervals roughly every 3 weeks based upon the patient's travel requirements. Outpatient MUGA scan performed on 02/25/2014 showed a drop in ejection fraction from 72% to 59% and for that reason doxorubicin will be discontinued from her chemotherapy regimen. She does feel fatigued but denies any PND, orthopnea, or palpitations. Appetite is good with no sore throat, peripheral paresthesias, urinary hesitancy, diarrhea, constipation, melena, hematochezia, hematuria, lower extremity swelling or redness, skin rash, headache, or seizures. She did take dexamethasone beginning yesterday.  MEDICAL HISTORY: Past Medical History  Diagnosis Date  . Chronic sinusitis   . Hypertension   . Diabetes mellitus without complication     diet controlled  . Cancer 11/12/2013    left breast cancer  . Invasive ductal carcinoma of left breast 12/11/2013    INTERIM HISTORY: has GANGLION-HAND/WRIST; Invasive ductal carcinoma of left breast; and Vasomotor instability on her problem list.    ALLERGIES:  has No Known Allergies.  MEDICATIONS: has a current medication list which includes the following prescription(s): atorvastatin, dexamethasone, first-dukes mouthwash, hydrocodone-acetaminophen, ibuprofen, lidocaine-prilocaine, loratadine, magnesium hydroxide,  metformin, metoclopramide, prochlorperazine, triamterene-hydrochlorothiazide, fluticasone, and venlafaxine xr.  SURGICAL HISTORY:  Past Surgical History  Procedure Laterality Date  . Ectopic pregnancy surgery    . Abdominal hysterectomy    . Left leg surgery      17 yrs of age  . Ganglion cyst excision    . Dilation and curettage of uterus    . Colonoscopy  01/09/2012    Procedure: COLONOSCOPY;  Surgeon: Dorothyann Peng, MD;  Location: [facility];  Service: Endoscopy;  Laterality: N/A;  1:30 pm  . Inguinal hernia repair Left   . Partial mastectomy with needle localization and axillary sentinel lymph node bx Left 11/12/2013    Procedure: PARTIAL MASTECTOMY WITH NEEDLE LOCALIZATION AND AXILLARY SENTINEL LYMPH NODE BX;  Surgeon: Jamesetta So, MD;  Location: [facility];  Service: General;  Laterality: Left;  . Portacath placement Right 12/29/2013    Procedure: INSERTION PORT-A-CATH;  Surgeon: Jamesetta So, MD;  Location: [facility];  Service: General;  Laterality: Right;    FAMILY HISTORY: family history includes Diabetes type II in her father and mother; Prostate cancer in her father.  SOCIAL HISTORY:  reports that she has been smoking Cigarettes.  She has a 3.75 pack-year smoking history. She has never used smokeless tobacco. She reports that she drinks alcohol. She reports that she does not use illicit drugs.  REVIEW OF SYSTEMS:  Other than that discussed above is noncontributory.  PHYSICAL EXAMINATION: ECOG PERFORMANCE STATUS: 1 - Symptomatic but completely ambulatory  Blood pressure 143/93, pulse 84, temperature 97 F (36.1 C), temperature source Oral, resp. rate 16, weight 157 lb 12.8 oz (71.578 kg).  GENERAL:alert, no distress and comfortable. Generalized alopecia. SKIN: skin color, texture, turgor are normal, no rashes or significant lesions EYES: PERLA; Conjunctiva  are pink and non-injected, sclera clear OROPHARYNX:no exudate, no erythema on lips, buccal mucosa, or tongue. NECK:  supple, thyroid normal size, non-tender, without nodularity. No masses CHEST: Status post left lumpectomy with no masses in either breast. LYMPH:  no palpable lymphadenopathy in the cervical, axillary or inguinal LUNGS: clear to auscultation and percussion with normal breathing effort HEART: regular rate & rhythm and no murmurs. ABDOMEN:abdomen soft, non-tender and normal bowel sounds MUSCULOSKELETAL:no cyanosis of digits and no clubbing. Range of motion normal.  NEURO: alert & oriented x 3 with fluent speech, no focal motor/sensory deficits   LABORATORY DATA: Infusion on 02/16/2014  Component Date Value Ref Range Status  . WBC 02/16/2014 10.0  4.0 - 10.5 K/uL Final  . RBC 02/16/2014 3.86* 3.87 - 5.11 MIL/uL Final  . Hemoglobin 02/16/2014 12.0  12.0 - 15.0 g/dL Final  . HCT 02/16/2014 35.1* 36.0 - 46.0 % Final  . MCV 02/16/2014 90.9  78.0 - 100.0 fL Final  . MCH 02/16/2014 31.1  26.0 - 34.0 pg Final  . MCHC 02/16/2014 34.2  30.0 - 36.0 g/dL Final  . RDW 02/16/2014 15.3  11.5 - 15.5 % Final  . Platelets 02/16/2014 307  150 - 400 K/uL Final  . Neutrophils Relative % 02/16/2014 84* 43 - 77 % Final  . Neutro Abs 02/16/2014 8.4* 1.7 - 7.7 K/uL Final  . Lymphocytes Relative 02/16/2014 8* 12 - 46 % Final  . Lymphs Abs 02/16/2014 0.8  0.7 - 4.0 K/uL Final  . Monocytes Relative 02/16/2014 9  3 - 12 % Final  . Monocytes Absolute 02/16/2014 0.9  0.1 - 1.0 K/uL Final  . Eosinophils Relative 02/16/2014 0  0 - 5 % Final  . Eosinophils Absolute 02/16/2014 0.0  0.0 - 0.7 K/uL Final  . Basophils Relative 02/16/2014 0  0 - 1 % Final  . Basophils Absolute 02/16/2014 0.0  0.0 - 0.1 K/uL Final  . Sodium 02/16/2014 138  137 - 147 mEq/L Final  . Potassium 02/16/2014 4.3  3.7 - 5.3 mEq/L Final  . Chloride 02/16/2014 100  96 - 112 mEq/L Final  . CO2 02/16/2014 26  19 - 32 mEq/L Final  . Glucose, Bld 02/16/2014 183* 70 - 99 mg/dL Final  . BUN 02/16/2014 19  6 - 23 mg/dL Final  . Creatinine, Ser  02/16/2014 0.84  0.50 - 1.10 mg/dL Final  . Calcium 02/16/2014 10.2  8.4 - 10.5 mg/dL Final  . Total Protein 02/16/2014 7.5  6.0 - 8.3 g/dL Final  . Albumin 02/16/2014 4.3  3.5 - 5.2 g/dL Final  . AST 02/16/2014 21  0 - 37 U/L Final  . ALT 02/16/2014 33  0 - 35 U/L Final  . Alkaline Phosphatase 02/16/2014 147* 39 - 117 U/L Final  . Total Bilirubin 02/16/2014 0.3  0.3 - 1.2 mg/dL Final  . GFR calc non Af Amer 02/16/2014 77* >90 mL/min Final  . GFR calc Af Amer 02/16/2014 89* >90 mL/min Final   Comment: (NOTE)                          The eGFR has been calculated using the CKD EPI equation.                          This calculation has not been validated in all clinical situations.                            eGFR's persistently <90 mL/min signify possible Chronic Kidney                          Disease.  . CA 27.29 02/16/2014 35  0 - 39 U/mL Final   Performed at [facility]    PATHOLOGY: No new pathology.  Urinalysis No results found for this basename: colorurine, appearanceur, labspec, phurine, glucoseu, hgbur, bilirubinur, ketonesur, proteinur, urobilinogen, nitrite, leukocytesur    RADIOGRAPHIC STUDIES: Nm Cardiac Muga Rest  02/25/2014   CLINICAL DATA:  Breast cancer.  On chemotherapy.  EXAM: NUCLEAR MEDICINE CARDIAC BLOOD POOL IMAGING (MUGA)  TECHNIQUE: Cardiac multi-gated acquisition was performed at rest following intravenous injection of Tc-99m labeled red blood cells.  COMPARISON:  12/23/2013  RADIOPHARMACEUTICALS:  25.0mCiTc-99m in-vitro labeled red blood cells.  FINDINGS: The left ventricular ejection fraction is equal to 59%. On the previous exam the left ventricular ejection fraction was calculated equal 72%.  IMPRESSION: 1. Decrease in the left ventricular ejection fraction which now equals 59%. Previously 72%.   Electronically Signed   By: Taylor  Stroud M.D.   On: 02/25/2014 11:11    ASSESSMENT:  #1. Cycle 4 of chemotherapy today, omitting doxorubicin and henceforth  as well, followed by Neulasta tomorrow. Magic mouthwash prophylactically. Stage I carcinoma left breast, triple negative. #2. Flonase inhaler 2 puffs in each nostril at bedtime. Continue Claritin 10 mg daily.  #3. Vasomotor instability, improved.  #4. Followup in 3 weeks to receive cycle #5 of treatment. Timing may not be exact based on patient's traveling needs. #5. 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    PLAN:  #1. Cytoxan/Taxotere IV today followed by Neulasta subcutaneously tomorrow. Doxorubicin will be discontinued for the duration of chemotherapy treatment. #2. Followup in 3 weeks for cycle #5 of of a planned 6 cycles. #3. Patient was told to call should he develop increasing fatigue, or any new symptoms that are troublesome and persistent.   All questions were answered. The patient knows to call the clinic with any problems, questions or concerns. We can certainly see the patient much sooner if necessary.   I spent 25 minutes counseling the patient face to face. The total time spent in the appointment was 30 minutes.    Formanek, Gregory A, MD 03/09/2014 10:08 AM   

## 2014-03-09 NOTE — Patient Instructions (Signed)
[facility] Discharge Instructions for Patients Receiving Chemotherapy  Today you received the following chemotherapy agents Cytoxan and Taxotere. Adriamycin is discontinued from your chemotherapy treatment plan as your MUGA scan revealed a decrease in your heart's ejection fraction. We will continue 2 more cycle's chemo with just Cytoxan and Taxotere. Appointment for Neulasta tomorrow. Keep appointment as scheduled for chemo and MD again in 3 weeks. Report any issues/concerns to clinic as needed prior to appointments. If you develop nausea and vomiting that is not controlled by your nausea medication, call the clinic. If it is after clinic hours your family physician or the after hours number for the clinic or go to the Emergency Department.   BELOW ARE SYMPTOMS THAT SHOULD BE REPORTED IMMEDIATELY:  *FEVER GREATER THAN 101.0 F  *CHILLS WITH OR WITHOUT FEVER  NAUSEA AND VOMITING THAT IS NOT CONTROLLED WITH YOUR NAUSEA MEDICATION  *UNUSUAL SHORTNESS OF BREATH  *UNUSUAL BRUISING OR BLEEDING  TENDERNESS IN MOUTH AND THROAT WITH OR WITHOUT PRESENCE OF ULCERS  *URINARY PROBLEMS  *BOWEL PROBLEMS  UNUSUAL RASH Items with * indicate a potential emergency and should be followed up as soon as possible.  One of the nurses will contact you 24 hours after your treatment. Please let the nurse know about any problems that you may have experienced. Feel free to call the clinic you have any questions or concerns. The clinic phone number is [phone].   I have been informed and understand all the instructions given to me. I know to contact the clinic, my physician, or go to the Emergency Department if any problems should occur. I do not have any questions at this time, but understand that I may call the clinic during office hours or the Patient Navigator at [phone] should I have any questions or need assistance in obtaining follow up  care.    __________________________________________  _____________  __________ Signature of Patient or Authorized Representative            Date                   Time    __________________________________________ Nurse's Signature

## 2014-03-10 ENCOUNTER — Encounter (HOSPITAL_BASED_OUTPATIENT_CLINIC_OR_DEPARTMENT_OTHER): Payer: BC Managed Care – PPO

## 2014-03-10 VITALS — BP 122/80 | HR 98 | Temp 98.2°F | Resp 18

## 2014-03-10 DIAGNOSIS — C50919 Malignant neoplasm of unspecified site of unspecified female breast: Secondary | ICD-10-CM

## 2014-03-10 DIAGNOSIS — C50912 Malignant neoplasm of unspecified site of left female breast: Secondary | ICD-10-CM

## 2014-03-10 DIAGNOSIS — Z5189 Encounter for other specified aftercare: Secondary | ICD-10-CM

## 2014-03-10 MED ORDER — PEGFILGRASTIM INJECTION 6 MG/0.6ML
SUBCUTANEOUS | Status: AC
  Filled 2014-03-10: qty 0.6

## 2014-03-10 MED ORDER — PEGFILGRASTIM INJECTION 6 MG/0.6ML
6.0000 mg | Freq: Once | SUBCUTANEOUS | Status: AC
  Administered 2014-03-10: 6 mg via SUBCUTANEOUS

## 2014-03-10 NOTE — Progress Notes (Signed)
Sally [name] presents today for injection per MD orders. Neulasta 6mg  administered SQ in right Abdomen. Administration without incident. Patient tolerated well.

## 2014-03-17 ENCOUNTER — Telehealth (HOSPITAL_COMMUNITY): Payer: Self-pay | Admitting: Dietician

## 2014-03-17 NOTE — Telephone Encounter (Signed)
Received consult via phone from [facility] for dx: weight gain.

## 2014-03-18 ENCOUNTER — Telehealth (HOSPITAL_COMMUNITY): Payer: Self-pay

## 2014-03-18 ENCOUNTER — Other Ambulatory Visit (HOSPITAL_COMMUNITY): Payer: Self-pay | Admitting: Oncology

## 2014-03-18 DIAGNOSIS — N898 Other specified noninflammatory disorders of vagina: Secondary | ICD-10-CM

## 2014-03-18 MED ORDER — FLUCONAZOLE 100 MG PO TABS: 100.0000 mg | ORAL_TABLET | Freq: Every day | ORAL | Status: DC

## 2014-03-18 NOTE — Telephone Encounter (Signed)
The patient called stating that she has a vaginal itch, and rash to area with bumps. No discharge or fevers. Denies any odors.  Stated she also has a dry cough for 2 days.

## 2014-03-18 NOTE — Telephone Encounter (Signed)
Diflucan prescription sent to her pharmacy. Pt notified and if not better will follow up with her pmd or ob/gyn.

## 2014-03-28 ENCOUNTER — Encounter (HOSPITAL_COMMUNITY): Payer: Self-pay | Admitting: Oncology

## 2014-03-28 DIAGNOSIS — I427 Cardiomyopathy due to drug and external agent: Secondary | ICD-10-CM

## 2014-03-28 DIAGNOSIS — T451X5A Adverse effect of antineoplastic and immunosuppressive drugs, initial encounter: Secondary | ICD-10-CM | POA: Insufficient documentation

## 2014-03-28 HISTORY — DX: Adverse effect of antineoplastic and immunosuppressive drugs, initial encounter: T45.1X5A

## 2014-03-28 HISTORY — DX: Cardiomyopathy due to drug and external agent: I42.7

## 2014-03-28 NOTE — Progress Notes (Signed)
[facility], MD [address] [place] [address]  Invasive ductal carcinoma of left breast  Chemotherapy induced cardiomyopathy  CURRENT THERAPY: Adjuvant TC every 21 days with the discontinuation of Adriamycin following cycle 3 for a decreased EF compared to baseline.  Cycle 5 today.  INTERVAL HISTORY: Sally [name] 58 y.o. female returns for  regular  visit for followup of stage I triple-negative left breast cancer, status post lumpectomy on 11/12/2013 with treatment intervals roughly every 3 weeks based upon the patient's travel requirements.  Outpatient MUGA scan performed on 02/25/2014 showed a drop in ejection fraction from 72% to 59% and for that reason doxorubicin was discontinued from her chemotherapy regimen starting on cycle 4.    Invasive ductal carcinoma of left breast   09/24/2013 Initial Diagnosis Invasive ductal carcinoma of left breast   11/12/2013 Surgery Left breast lumpectomy by Dr. Arnoldo Morale- High grade invasive ductal carcinoma less than 0.1 cm from posterior and medial margins, 0/1 lymph nodes, triple negative   12/31/2013 -  Chemotherapy TAC every 21 days.  Adriamycin dropped following cycle 3 for a decreased EF compared to baseline.    I personally reviewed and went over laboratory results with the patient.  The results are noted within this dictation.  She continues to tolerate treatment well thus far.  We spent time discussing radiation therapy.  She thinks she will need to go back to work in May which will be around the time she will be embarking on radiation therapy.  Although she works in [place], she only works 2 nights per week and every-other weekend.  She works [employer].  Therefore, having radiation in [place] does not make much sense to her.  As a result, we will refer her to [facility] in [place].  We discussed the role of this intervention.  We discussed the possible risks, benefits, alternatives, and side effects of this intervention.  She is  educated that radiation is daily Monday through Friday.  She will likely need 6-7 weeks of therapy.   She continues to tolerate therapy well with only minimal side effects.   Oncologically, she denies any complaints and ROS questioning is negative.    Past Medical History  Diagnosis Date  . Chronic sinusitis   . Hypertension   . Diabetes mellitus without complication     diet controlled  . Cancer 11/12/2013    left breast cancer  . 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  Outpatient MUGA scan performed on 02/25/2014 showed a drop in ejection fraction from 72% to 59% and for that reason doxorubicin was discontinued from her chemotherapy regimen starting on cycle 4.     has GANGLION-HAND/WRIST; Invasive ductal carcinoma of left breast; Vasomotor instability; and Chemotherapy induced cardiomyopathy on her problem list.     has No Known Allergies.  Ms. Mckell does not currently have medications on file.  Past Surgical History  Procedure Laterality Date  . Ectopic pregnancy surgery    . Abdominal hysterectomy    . Left leg surgery      17 yrs of age  . Ganglion cyst excision    . Dilation and curettage of uterus    . Colonoscopy  01/09/2012    Procedure: COLONOSCOPY;  Surgeon: Dorothyann Peng, MD;  Location: [facility];  Service: Endoscopy;  Laterality: N/A;  1:30 pm  . Inguinal hernia repair Left   . Partial mastectomy with needle  localization and axillary sentinel lymph node bx Left 11/12/2013    Procedure: PARTIAL MASTECTOMY WITH NEEDLE LOCALIZATION AND AXILLARY SENTINEL LYMPH NODE BX;  Surgeon: Dalia Heading, MD;  Location: [facility];  Service: General;  Laterality: Left;  . Portacath placement Right 12/29/2013    Procedure: INSERTION PORT-A-CATH;  Surgeon: Dalia Heading, MD;  Location: [facility];  Service: General;  Laterality: Right;    Denies any headaches, dizziness, double vision, fevers,  chills, night sweats, nausea, vomiting, diarrhea, constipation, chest pain, heart palpitations, shortness of breath, blood in stool, black tarry stool, urinary pain, urinary burning, urinary frequency, hematuria.   PHYSICAL EXAMINATION  ECOG PERFORMANCE STATUS: 0 - Asymptomatic  There were no vitals filed for this visit.  GENERAL:alert, no distress, well nourished, well developed, comfortable, cooperative and smiling SKIN: skin color, texture, turgor are normal, no rashes or significant lesions HEAD: Normocephalic, No masses, lesions, tenderness or abnormalities EYES: normal, PERRLA, EOMI, Conjunctiva are pink and non-injected EARS: External ears normal OROPHARYNX:mucous membranes are moist  NECK: supple, trachea midline LYMPH:  not examined BREAST:not examined LUNGS: not examined HEART: not examined ABDOMEN:not examined BACK: Back symmetric, no curvature. EXTREMITIES:less then 2 second capillary refill, no joint deformities, effusion, or inflammation, no skin discoloration  NEURO: alert & oriented x 3 with fluent speech, no focal motor/sensory deficits   LABORATORY DATA: CBC    Component Value Date/Time   WBC 11.2* 03/09/2014 0935   RBC 3.57* 03/09/2014 0935   HGB 11.1* 03/09/2014 0935   HCT 32.6* 03/09/2014 0935   PLT 255 03/09/2014 0935   MCV 91.3 03/09/2014 0935   MCH 31.1 03/09/2014 0935   MCHC 34.0 03/09/2014 0935   RDW 16.5* 03/09/2014 0935   LYMPHSABS 0.5* 03/09/2014 0935   MONOABS 0.7 03/09/2014 0935   EOSABS 0.0 03/09/2014 0935   BASOSABS 0.0 03/09/2014 0935      Chemistry      Component Value Date/Time   NA 140 03/09/2014 0935   K 4.1 03/09/2014 0935   CL 102 03/09/2014 0935   CO2 26 03/09/2014 0935   BUN 17 03/09/2014 0935   CREATININE 0.76 03/09/2014 0935      Component Value Date/Time   CALCIUM 10.2 03/09/2014 0935   ALKPHOS 125* 03/09/2014 0935   AST 19 03/09/2014 0935   ALT 22 03/09/2014 0935   BILITOT 0.3 03/09/2014 0935        ASSESSMENT:  1. Stage I  triple-negative left breast cancer, status post lumpectomy on 11/12/2013 with TAC treatment intervals roughly every 3 weeks based upon the patient's travel requirements.  Adriamycin was discontinued due a decreased EF compared to baseline following cycle 3. 2. Chemotherapy-induced (adriamycin) cardiomyopathy. D/C'd following cycle 3.  Patient Active Problem List   Diagnosis Date Noted  . 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 Vasomotor instability 02/16/2014  . 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 GANGLION-HAND/WRIST 03/04/2008    PLAN:  1. I personally reviewed and went over laboratory results with the patient.  The results are noted within this dictation. 2. I personally reviewed and went over radiographic studies with the patient.  The results are noted within this dictation.   3. MUGA scan after the completion of chemotherapy.  Order placed.  4. Pre-chemo labs: CBC diff, CMET 5. Chemotherapy today as planned if treatment parameters are met. 6. Referral to Rad Onc in [place] 7. Return in 3 weeks for follow-up and chemotherapy.    THERAPY PLAN:  She will continue with chemotherapy as  planned.  She will finish cycle 6 of chemotherapy in 3 weeks.  She will then move on to have radiation therapy.  We discussed the logistics of this intervention and it is felt that radiation in [place], [place] is more reasonable for her than [place].  All questions were answered. The patient knows to call the clinic with any problems, questions or concerns. We can certainly see the patient much sooner if necessary.  Patient and plan discussed with Dr. Farrel Gobble and he is in agreement with the aforementioned.   Penelope Fittro 03/28/2014

## 2014-03-30 ENCOUNTER — Encounter (HOSPITAL_BASED_OUTPATIENT_CLINIC_OR_DEPARTMENT_OTHER): Payer: BC Managed Care – PPO

## 2014-03-30 ENCOUNTER — Encounter (HOSPITAL_BASED_OUTPATIENT_CLINIC_OR_DEPARTMENT_OTHER): Payer: BC Managed Care – PPO | Admitting: Oncology

## 2014-03-30 ENCOUNTER — Encounter (HOSPITAL_COMMUNITY): Payer: BC Managed Care – PPO | Attending: Hematology and Oncology

## 2014-03-30 VITALS — BP 130/89 | HR 99 | Temp 97.7°F | Resp 18 | Wt 156.0 lb

## 2014-03-30 DIAGNOSIS — C50419 Malignant neoplasm of upper-outer quadrant of unspecified female breast: Secondary | ICD-10-CM

## 2014-03-30 DIAGNOSIS — I429 Cardiomyopathy, unspecified: Secondary | ICD-10-CM | POA: Insufficient documentation

## 2014-03-30 DIAGNOSIS — C50912 Malignant neoplasm of unspecified site of left female breast: Secondary | ICD-10-CM

## 2014-03-30 DIAGNOSIS — I427 Cardiomyopathy due to drug and external agent: Secondary | ICD-10-CM

## 2014-03-30 DIAGNOSIS — C50919 Malignant neoplasm of unspecified site of unspecified female breast: Secondary | ICD-10-CM | POA: Insufficient documentation

## 2014-03-30 DIAGNOSIS — Z5111 Encounter for antineoplastic chemotherapy: Secondary | ICD-10-CM

## 2014-03-30 DIAGNOSIS — T451X5A Adverse effect of antineoplastic and immunosuppressive drugs, initial encounter: Secondary | ICD-10-CM

## 2014-03-30 DIAGNOSIS — E119 Type 2 diabetes mellitus without complications: Secondary | ICD-10-CM

## 2014-03-30 DIAGNOSIS — I10 Essential (primary) hypertension: Secondary | ICD-10-CM

## 2014-03-30 LAB — CBC WITH DIFFERENTIAL/PLATELET
Basophils Absolute: 0 10*3/uL (ref 0.0–0.1)
Basophils Relative: 0 % (ref 0–1)
EOS ABS: 0 10*3/uL (ref 0.0–0.7)
EOS PCT: 0 % (ref 0–5)
HEMATOCRIT: 31.2 % — AB (ref 36.0–46.0)
Hemoglobin: 10.4 g/dL — ABNORMAL LOW (ref 12.0–15.0)
LYMPHS ABS: 0.5 10*3/uL — AB (ref 0.7–4.0)
LYMPHS PCT: 4 % — AB (ref 12–46)
MCH: 31.2 pg (ref 26.0–34.0)
MCHC: 33.3 g/dL (ref 30.0–36.0)
MCV: 93.7 fL (ref 78.0–100.0)
MONO ABS: 0.6 10*3/uL (ref 0.1–1.0)
MONOS PCT: 5 % (ref 3–12)
Neutro Abs: 10.6 10*3/uL — ABNORMAL HIGH (ref 1.7–7.7)
Neutrophils Relative %: 91 % — ABNORMAL HIGH (ref 43–77)
Platelets: 239 10*3/uL (ref 150–400)
RBC: 3.33 MIL/uL — AB (ref 3.87–5.11)
RDW: 17.9 % — ABNORMAL HIGH (ref 11.5–15.5)
WBC: 11.7 10*3/uL — ABNORMAL HIGH (ref 4.0–10.5)

## 2014-03-30 LAB — CANCER ANTIGEN 27.29: CA 27.29: 38 U/mL (ref 0–39)

## 2014-03-30 LAB — COMPREHENSIVE METABOLIC PANEL
ALT: 20 U/L (ref 0–35)
AST: 15 U/L (ref 0–37)
Albumin: 3.9 g/dL (ref 3.5–5.2)
Alkaline Phosphatase: 142 U/L — ABNORMAL HIGH (ref 39–117)
BUN: 24 mg/dL — AB (ref 6–23)
CALCIUM: 10.2 mg/dL (ref 8.4–10.5)
CO2: 24 meq/L (ref 19–32)
CREATININE: 0.86 mg/dL (ref 0.50–1.10)
Chloride: 100 mEq/L (ref 96–112)
GFR, EST AFRICAN AMERICAN: 87 mL/min — AB (ref >90)
GFR, EST NON AFRICAN AMERICAN: 75 mL/min — AB (ref >90)
GLUCOSE: 202 mg/dL — AB (ref 70–99)
Potassium: 4.7 mEq/L (ref 3.7–5.3)
Sodium: 138 mEq/L (ref 137–147)
TOTAL PROTEIN: 7.1 g/dL (ref 6.0–8.3)
Total Bilirubin: 0.3 mg/dL (ref 0.3–1.2)

## 2014-03-30 MED ORDER — DEXAMETHASONE SODIUM PHOSPHATE 20 MG/5ML IJ SOLN: 12.0000 mg | Freq: Once | INTRAMUSCULAR | Status: DC

## 2014-03-30 MED ORDER — SODIUM CHLORIDE 0.9 % IV SOLN
600.0000 mg/m2 | Freq: Once | INTRAVENOUS | Status: AC
  Administered 2014-03-30: 1040 mg via INTRAVENOUS
  Filled 2014-03-30: qty 50

## 2014-03-30 MED ORDER — SODIUM CHLORIDE 0.9 % IV SOLN
Freq: Once | INTRAVENOUS | Status: AC
  Administered 2014-03-30: 11:00:00 via INTRAVENOUS

## 2014-03-30 MED ORDER — SODIUM CHLORIDE 0.9 % IV SOLN: 150.0000 mg | Freq: Once | INTRAVENOUS | Status: DC

## 2014-03-30 MED ORDER — HEPARIN SOD (PORK) LOCK FLUSH 100 UNIT/ML IV SOLN
500.0000 [IU] | Freq: Once | INTRAVENOUS | Status: AC | PRN
  Administered 2014-03-30: 500 [IU]
  Filled 2014-03-30: qty 5

## 2014-03-30 MED ORDER — SODIUM CHLORIDE 0.9 % IJ SOLN: 10.0000 mL | INTRAMUSCULAR | Status: DC | PRN

## 2014-03-30 MED ORDER — PALONOSETRON HCL INJECTION 0.25 MG/5ML
0.2500 mg | Freq: Once | INTRAVENOUS | Status: AC
  Administered 2014-03-30: 0.25 mg via INTRAVENOUS

## 2014-03-30 MED ORDER — FOSAPREPITANT DIMEGLUMINE INJECTION 150 MG
Freq: Once | INTRAVENOUS | Status: AC
  Administered 2014-03-30: 11:00:00 via INTRAVENOUS
  Filled 2014-03-30: qty 5

## 2014-03-30 MED ORDER — DEXTROSE 5 % IV SOLN
75.0000 mg/m2 | Freq: Once | INTRAVENOUS | Status: AC
Start: 2014-03-30 — End: 2014-03-30
  Administered 2014-03-30: 130 mg via INTRAVENOUS
  Filled 2014-03-30: qty 13

## 2014-03-30 MED ORDER — PALONOSETRON HCL INJECTION 0.25 MG/5ML
INTRAVENOUS | Status: AC
  Filled 2014-03-30: qty 5

## 2014-03-30 NOTE — Progress Notes (Signed)
Labs drawn today for cbc/diff,[id number],cmp

## 2014-03-30 NOTE — Patient Instructions (Signed)
[facility] Discharge Instructions for Patients Receiving Chemotherapy  Today you received the following chemotherapy agents Taxol and Cytoxan Cycle 5. We will complete Cycle 6 chemotherapy as scheduled in 3 weeks. MD appointment with next chemo visit. We will make a referral to [facility] for Radiation Oncology. They will call you with the appointment. Report any issues/concerns to clinic as needed prior to appointments.  If you develop nausea and vomiting that is not controlled by your nausea medication, call the clinic. If it is after clinic hours your family physician or the after hours number for the clinic or go to the Emergency Department.   BELOW ARE SYMPTOMS THAT SHOULD BE REPORTED IMMEDIATELY:  *FEVER GREATER THAN 101.0 F  *CHILLS WITH OR WITHOUT FEVER  NAUSEA AND VOMITING THAT IS NOT CONTROLLED WITH YOUR NAUSEA MEDICATION  *UNUSUAL SHORTNESS OF BREATH  *UNUSUAL BRUISING OR BLEEDING  TENDERNESS IN MOUTH AND THROAT WITH OR WITHOUT PRESENCE OF ULCERS  *URINARY PROBLEMS  *BOWEL PROBLEMS  UNUSUAL RASH Items with * indicate a potential emergency and should be followed up as soon as possible.  One of the nurses will contact you 24 hours after your treatment. Please let the nurse know about any problems that you may have experienced. Feel free to call the clinic you have any questions or concerns. The clinic phone number is [phone].   I have been informed and understand all the instructions given to me. I know to contact the clinic, my physician, or go to the Emergency Department if any problems should occur. I do not have any questions at this time, but understand that I may call the clinic during office hours or the Patient Navigator at [phone] should I have any questions or need assistance in obtaining follow up care.    __________________________________________  _____________  __________ Signature of Patient or Authorized  Representative            Date                   Time    __________________________________________ Nurse's Signature

## 2014-03-31 ENCOUNTER — Encounter (HOSPITAL_BASED_OUTPATIENT_CLINIC_OR_DEPARTMENT_OTHER): Payer: BC Managed Care – PPO

## 2014-03-31 ENCOUNTER — Other Ambulatory Visit (HOSPITAL_COMMUNITY): Payer: Self-pay | Admitting: Oncology

## 2014-03-31 DIAGNOSIS — C50912 Malignant neoplasm of unspecified site of left female breast: Secondary | ICD-10-CM

## 2014-03-31 DIAGNOSIS — Z5189 Encounter for other specified aftercare: Secondary | ICD-10-CM

## 2014-03-31 DIAGNOSIS — C50419 Malignant neoplasm of upper-outer quadrant of unspecified female breast: Secondary | ICD-10-CM

## 2014-03-31 MED ORDER — PEGFILGRASTIM INJECTION 6 MG/0.6ML
SUBCUTANEOUS | Status: AC
  Filled 2014-03-31: qty 0.6

## 2014-03-31 MED ORDER — PEGFILGRASTIM INJECTION 6 MG/0.6ML
6.0000 mg | Freq: Once | SUBCUTANEOUS | Status: AC
  Administered 2014-03-31: 6 mg via SUBCUTANEOUS

## 2014-03-31 MED ORDER — DEXAMETHASONE 4 MG PO TABS: ORAL_TABLET | ORAL | Status: DC

## 2014-03-31 NOTE — Progress Notes (Signed)
Drusilla Kanner Zick presents today for injection per MD orders. Neulasta 6mg  administered SQ in right lower abdomen. Administration without incident. Patient tolerated well.

## 2014-04-01 ENCOUNTER — Telehealth (HOSPITAL_COMMUNITY): Payer: Self-pay | Admitting: *Deleted

## 2014-04-13 ENCOUNTER — Telehealth (HOSPITAL_COMMUNITY): Payer: Self-pay

## 2014-04-13 NOTE — Telephone Encounter (Signed)
Pt called and stated she has had a rash to her vagina, butt and upper thigh area for 1 week. Clear drainage with no odor. Also had 1 episode of "pus out of her breast" (left).  Rn spoke to tom pa, pt advised to follow up with her pcp for the rash.  Verbalized understanding of all.

## 2014-04-20 ENCOUNTER — Encounter (HOSPITAL_COMMUNITY): Payer: Self-pay

## 2014-04-20 ENCOUNTER — Encounter (HOSPITAL_BASED_OUTPATIENT_CLINIC_OR_DEPARTMENT_OTHER): Payer: BC Managed Care – PPO

## 2014-04-20 VITALS — BP 138/93 | HR 76 | Temp 97.1°F | Resp 18 | Wt 158.0 lb

## 2014-04-20 DIAGNOSIS — Z5111 Encounter for antineoplastic chemotherapy: Secondary | ICD-10-CM

## 2014-04-20 DIAGNOSIS — I519 Heart disease, unspecified: Secondary | ICD-10-CM

## 2014-04-20 DIAGNOSIS — I427 Cardiomyopathy due to drug and external agent: Secondary | ICD-10-CM

## 2014-04-20 DIAGNOSIS — C50912 Malignant neoplasm of unspecified site of left female breast: Secondary | ICD-10-CM

## 2014-04-20 DIAGNOSIS — C50919 Malignant neoplasm of unspecified site of unspecified female breast: Secondary | ICD-10-CM

## 2014-04-20 DIAGNOSIS — B3731 Acute candidiasis of vulva and vagina: Secondary | ICD-10-CM

## 2014-04-20 DIAGNOSIS — Z171 Estrogen receptor negative status [ER-]: Secondary | ICD-10-CM

## 2014-04-20 DIAGNOSIS — I429 Cardiomyopathy, unspecified: Secondary | ICD-10-CM

## 2014-04-20 DIAGNOSIS — N898 Other specified noninflammatory disorders of vagina: Secondary | ICD-10-CM

## 2014-04-20 DIAGNOSIS — B373 Candidiasis of vulva and vagina: Secondary | ICD-10-CM

## 2014-04-20 DIAGNOSIS — T451X5A Adverse effect of antineoplastic and immunosuppressive drugs, initial encounter: Secondary | ICD-10-CM

## 2014-04-20 LAB — COMPREHENSIVE METABOLIC PANEL
ALBUMIN: 4.1 g/dL (ref 3.5–5.2)
ALK PHOS: 160 U/L — AB (ref 39–117)
ALT: 28 U/L (ref 0–35)
AST: 23 U/L (ref 0–37)
BILIRUBIN TOTAL: 0.4 mg/dL (ref 0.3–1.2)
BUN: 22 mg/dL (ref 6–23)
CHLORIDE: 99 meq/L (ref 96–112)
CO2: 25 mEq/L (ref 19–32)
Calcium: 10 mg/dL (ref 8.4–10.5)
Creatinine, Ser: 0.92 mg/dL (ref 0.50–1.10)
GFR calc Af Amer: 80 mL/min — ABNORMAL LOW (ref >90)
GFR calc non Af Amer: 69 mL/min — ABNORMAL LOW (ref >90)
GLUCOSE: 219 mg/dL — AB (ref 70–99)
Potassium: 4.6 mEq/L (ref 3.7–5.3)
SODIUM: 138 meq/L (ref 137–147)
TOTAL PROTEIN: 7.2 g/dL (ref 6.0–8.3)

## 2014-04-20 LAB — CBC WITH DIFFERENTIAL/PLATELET
Basophils Absolute: 0 10*3/uL (ref 0.0–0.1)
Basophils Relative: 0 % (ref 0–1)
EOS ABS: 0 10*3/uL (ref 0.0–0.7)
Eosinophils Relative: 0 % (ref 0–5)
HCT: 31.8 % — ABNORMAL LOW (ref 36.0–46.0)
Hemoglobin: 10.6 g/dL — ABNORMAL LOW (ref 12.0–15.0)
LYMPHS ABS: 0.6 10*3/uL — AB (ref 0.7–4.0)
Lymphocytes Relative: 6 % — ABNORMAL LOW (ref 12–46)
MCH: 32.2 pg (ref 26.0–34.0)
MCHC: 33.3 g/dL (ref 30.0–36.0)
MCV: 96.7 fL (ref 78.0–100.0)
Monocytes Absolute: 0.5 10*3/uL (ref 0.1–1.0)
Monocytes Relative: 6 % (ref 3–12)
NEUTROS PCT: 88 % — AB (ref 43–77)
Neutro Abs: 7.7 10*3/uL (ref 1.7–7.7)
PLATELETS: 253 10*3/uL (ref 150–400)
RBC: 3.29 MIL/uL — ABNORMAL LOW (ref 3.87–5.11)
RDW: 17.9 % — ABNORMAL HIGH (ref 11.5–15.5)
WBC: 8.8 10*3/uL (ref 4.0–10.5)

## 2014-04-20 MED ORDER — SODIUM CHLORIDE 0.9 % IV SOLN: 150.0000 mg | Freq: Once | INTRAVENOUS | Status: DC

## 2014-04-20 MED ORDER — SODIUM CHLORIDE 0.9 % IV SOLN
600.0000 mg/m2 | Freq: Once | INTRAVENOUS | Status: AC
  Administered 2014-04-20: 1040 mg via INTRAVENOUS
  Filled 2014-04-20: qty 52

## 2014-04-20 MED ORDER — SODIUM CHLORIDE 0.9 % IV SOLN
Freq: Once | INTRAVENOUS | Status: AC
  Administered 2014-04-20: 10:00:00 via INTRAVENOUS
  Filled 2014-04-20: qty 5

## 2014-04-20 MED ORDER — DOCETAXEL CHEMO INJECTION 160 MG/16ML
75.0000 mg/m2 | Freq: Once | INTRAVENOUS | Status: AC
  Administered 2014-04-20: 130 mg via INTRAVENOUS
  Filled 2014-04-20: qty 13

## 2014-04-20 MED ORDER — PALONOSETRON HCL INJECTION 0.25 MG/5ML
0.2500 mg | Freq: Once | INTRAVENOUS | Status: AC
  Administered 2014-04-20: 0.25 mg via INTRAVENOUS
  Filled 2014-04-20: qty 5

## 2014-04-20 MED ORDER — DEXAMETHASONE SODIUM PHOSPHATE 20 MG/5ML IJ SOLN: 12.0000 mg | Freq: Once | INTRAMUSCULAR | Status: DC

## 2014-04-20 MED ORDER — SODIUM CHLORIDE 0.9 % IV SOLN
Freq: Once | INTRAVENOUS | Status: AC
  Administered 2014-04-20: 09:00:00 via INTRAVENOUS

## 2014-04-20 MED ORDER — FLUCONAZOLE 200 MG PO TABS: ORAL_TABLET | ORAL | Status: DC

## 2014-04-20 MED ORDER — HEPARIN SOD (PORK) LOCK FLUSH 100 UNIT/ML IV SOLN
500.0000 [IU] | Freq: Once | INTRAVENOUS | Status: AC | PRN
  Administered 2014-04-20: 500 [IU]

## 2014-04-20 MED ORDER — SODIUM CHLORIDE 0.9 % IJ SOLN
10.0000 mL | INTRAMUSCULAR | Status: DC | PRN
  Administered 2014-04-20: 10 mL

## 2014-04-20 NOTE — Progress Notes (Signed)
Tolerated well

## 2014-04-20 NOTE — Patient Instructions (Signed)
[facility] Discharge Instructions  RECOMMENDATIONS MADE BY THE CONSULTANT AND ANY TEST RESULTS WILL BE SENT TO YOUR REFERRING PHYSICIAN.  EXAM FINDINGS BY THE PHYSICIAN TODAY AND SIGNS OR SYMPTOMS TO REPORT TO CLINIC OR PRIMARY PHYSICIAN: Exam and findings as discussed by Dr. Barnet Glasgow.  MEDICATIONS PRESCRIBED:  1. Diflucan as needed for skin rash symptoms.  INSTRUCTIONS/FOLLOW-UP: 1.  Return in 3 weeks for your scheduled lab and office visit. 2.  You were prescribed Diflucan to take prophylacticly should your skin rash return after this chemo treatment.  Please report any fevers, chills, worsening rash, difficulty swallowing. 3.  Continue topical Lotrisone cream as directed. 4.  Please keep your appointment on May 15th for radiation therapy.  Thank you for choosing [facility] to provide your oncology and hematology care.  To afford each patient quality time with our providers, please arrive at least 15 minutes before your scheduled appointment time.  With your help, our goal is to use those 15 minutes to complete the necessary work-up to ensure our physicians have the information they need to help with your evaluation and healthcare recommendations.    Effective January 1st, 2014, we ask that you re-schedule your appointment with our physicians should you arrive 10 or more minutes late for your appointment.  We strive to give you quality time with our providers, and arriving late affects you and other patients whose appointments are after yours.    Again, thank you for choosing [facility].  Our hope is that these requests will decrease the amount of time that you wait before being seen by our physicians.       _____________________________________________________________  Should you have questions after your visit to [facility], please contact our office at [phone] between the hours of 8:30 a.m. and 5:00 p.m.  Voicemails  left after 4:30 p.m. will not be returned until the following business day.  For prescription refill requests, have your pharmacy contact our office with your prescription refill request.

## 2014-04-20 NOTE — Progress Notes (Signed)
[facility]  OFFICE PROGRESS Sally Ewing, MD [address] [place] [address]  DIAGNOSIS: Breast cancer, left - Plan: [facility] [place] [facility]  Chemotherapy induced cardiomyopathy  Left ventricular dysfunction  Itching in the vaginal area - Plan: fluconazole (DIFLUCAN) 200 MG tablet  Chief Complaint  Patient presents with  . Breast Cancer    CURRENT THERAPY: Adjuvant chemotherapy currently receiving docetaxel/Cytoxan every 3 weeks with Neulasta support having received 3 cycles of TAC initially with doxorubicin discontinued due to decrease in ejection fraction. She is scheduled for sixth and final cycle of chemotherapy today.  INTERVAL HISTORY: Sally [name] 58 y.o. female returns for completion of adjuvant chemotherapy for stage I triple-negative left breast cancer, status post lumpectomy on 11/12/2013 with treatment intervals roughly every 3 weeks based upon the patient's travel requirements. Outpatient MUGA scan performed on 02/25/2014 showed a drop in ejection fraction from 72% to 59% and for that reason doxorubicin was discontinued from her treatment regimen starting on cycle for having received 3 cycles containing doxorubicin along with docetaxel and Cytoxan. She does receive Neulasta support. She again developed vaginal discharge or itching in both medial portions of the thighs. She has been placed on Lotrisone cream and to oral Diflucan. She denies any oral discomfort or dysphagia. About 2 weeks ago she developed drainage of the left breast incision site used for postoperative drain. A small amount of pus was extruded and now there is a healing scar in place with an eschar. She denies any breast pain now. Appetite has been good with no fever, night sweats, but with fatigue. She denies any lower extremity swelling or redness, peripheral paresthesias, sore throat, PND, orthopnea, palpitations, headache, or  seizures.  MEDICAL HISTORY: Past Medical History  Diagnosis Date  . Chronic sinusitis   . Hypertension   . Diabetes mellitus without complication     diet controlled  . Cancer 11/12/2013    left breast cancer  . 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  Outpatient MUGA scan performed on 02/25/2014 showed a drop in ejection fraction from 72% to 59% and for that reason doxorubicin was discontinued from her chemotherapy regimen starting on cycle 4.     INTERIM HISTORY: has GANGLION-HAND/WRIST; Invasive ductal carcinoma of left breast; Vasomotor instability; and Chemotherapy induced cardiomyopathy on her problem list. Invasive ductal carcinoma of left breast  09/24/2013  Initial Diagnosis  Invasive ductal carcinoma of left breast  11/12/2013  Surgery  Left breast lumpectomy by Dr. Arnoldo Morale- High grade invasive ductal carcinoma less than 0.1 cm from posterior and medial margins, 0/1 lymph nodes, triple negative  12/31/2013 -  Chemotherapy  TAC every 21 days. Adriamycin dropped following cycle 3 for a decreased EF compared to baseline   ALLERGIES:  has No Known Allergies.  MEDICATIONS: has a current medication list which includes the following prescription(s): atorvastatin, clotrimazole-betamethasone, dexamethasone, first-dukes mouthwash, fluconazole, hydrocodone-acetaminophen, ibuprofen, lidocaine-prilocaine, loratadine, magnesium hydroxide, metformin, metoclopramide, prochlorperazine, triamterene-hydrochlorothiazide, venlafaxine xr, and fluticasone, and the following Facility-Administered Medications: cyclophosphamide (CYTOXAN) 1,040 mg in sodium chloride 0.9 % 250 mL chemo infusion, DOCEtaxel (TAXOTERE) 130 mg in dextrose 5 % 250 mL chemo infusion, fosaprepitant (EMEND) 150 mg, dexamethasone (DECADRON) 12 mg in sodium chloride 0.9 % 145 mL IVPB, heparin lock flush, palonosetron, and sodium chloride.  SURGICAL  HISTORY:  Past Surgical History  Procedure Laterality Date  . Ectopic pregnancy surgery    .  Abdominal hysterectomy    . Left leg surgery      17 yrs of age  . Ganglion cyst excision    . Dilation and curettage of uterus    . Colonoscopy  01/09/2012    Procedure: COLONOSCOPY;  Surgeon: Dorothyann Peng, MD;  Location: [facility];  Service: Endoscopy;  Laterality: N/A;  1:30 pm  . Inguinal hernia repair Left   . Partial mastectomy with needle localization and axillary sentinel lymph node bx Left 11/12/2013    Procedure: PARTIAL MASTECTOMY WITH NEEDLE LOCALIZATION AND AXILLARY SENTINEL LYMPH NODE BX;  Surgeon: Jamesetta So, MD;  Location: [facility];  Service: General;  Laterality: Left;  . Portacath placement Right 12/29/2013    Procedure: INSERTION PORT-A-CATH;  Surgeon: Jamesetta So, MD;  Location: [facility];  Service: General;  Laterality: Right;    FAMILY HISTORY: family history includes Diabetes type II in her father and mother; Prostate cancer in her father.  SOCIAL HISTORY:  reports that she has been smoking Cigarettes.  She has a 3.75 pack-year smoking history. She has never used smokeless tobacco. She reports that she drinks alcohol. She reports that she does not use illicit drugs.  REVIEW OF SYSTEMS:  Other than that discussed above is noncontributory.  PHYSICAL EXAMINATION: ECOG PERFORMANCE STATUS: 1 - Symptomatic but completely ambulatory  There were no vitals taken for this visit.  GENERAL:alert, no distress and comfortable. Universal alopecia. SKIN: skin color, texture, turgor are normal, no rashes or significant lesions EYES: PERLA; Conjunctiva are pink and non-injected, sclera clear SINUSES: No redness or tenderness over maxillary or ethmoid sinuses OROPHARYNX:no exudate, no erythema on lips, buccal mucosa, or tongue. NECK: supple, thyroid normal size, non-tender, without nodularity. No masses CHEST: Status post left breast lumpectomy with drain site scabbed over. No  tenderness or mass felt. Breast without mass. LYMPH:  no palpable lymphadenopathy in the cervical, axillary or inguinal LUNGS: clear to auscultation and percussion with normal breathing effort HEART: regular rate & rhythm and no murmurs. ABDOMEN:abdomen soft, non-tender and normal bowel sounds MUSCULOSKELETAL:no cyanosis of digits and no clubbing. Range of motion normal.  NEURO: alert & oriented x 3 with fluent speech, no focal motor/sensory deficits. Normal DTRs.   LABORATORY DATA: Infusion on 04/20/2014  Component Date Value Ref Range Status  . WBC 04/20/2014 8.8  4.0 - 10.5 K/uL Final  . RBC 04/20/2014 3.29* 3.87 - 5.11 MIL/uL Final  . Hemoglobin 04/20/2014 10.6* 12.0 - 15.0 g/dL Final  . HCT 04/20/2014 31.8* 36.0 - 46.0 % Final  . MCV 04/20/2014 96.7  78.0 - 100.0 fL Final  . MCH 04/20/2014 32.2  26.0 - 34.0 pg Final  . MCHC 04/20/2014 33.3  30.0 - 36.0 g/dL Final  . RDW 04/20/2014 17.9* 11.5 - 15.5 % Final  . Platelets 04/20/2014 253  150 - 400 K/uL Final  . Neutrophils Relative % 04/20/2014 88* 43 - 77 % Final  . Neutro Abs 04/20/2014 7.7  1.7 - 7.7 K/uL Final  . Lymphocytes Relative 04/20/2014 6* 12 - 46 % Final  . Lymphs Abs 04/20/2014 0.6* 0.7 - 4.0 K/uL Final  . Monocytes Relative 04/20/2014 6  3 - 12 % Final  . Monocytes Absolute 04/20/2014 0.5  0.1 - 1.0 K/uL Final  . Eosinophils Relative 04/20/2014 0  0 - 5 % Final  . Eosinophils Absolute 04/20/2014 0.0  0.0 - 0.7 K/uL Final  . Basophils Relative 04/20/2014 0  0 - 1 % Final  . Basophils Absolute  04/20/2014 0.0  0.0 - 0.1 K/uL Final  Infusion on 03/30/2014  Component Date Value Ref Range Status  . WBC 03/30/2014 11.7* 4.0 - 10.5 K/uL Final  . RBC 03/30/2014 3.33* 3.87 - 5.11 MIL/uL Final  . Hemoglobin 03/30/2014 10.4* 12.0 - 15.0 g/dL Final  . HCT 03/30/2014 31.2* 36.0 - 46.0 % Final  . MCV 03/30/2014 93.7  78.0 - 100.0 fL Final  . MCH 03/30/2014 31.2  26.0 - 34.0 pg Final  . MCHC 03/30/2014 33.3  30.0 - 36.0 g/dL  Final  . RDW 03/30/2014 17.9* 11.5 - 15.5 % Final  . Platelets 03/30/2014 239  150 - 400 K/uL Final  . Neutrophils Relative % 03/30/2014 91* 43 - 77 % Final  . Neutro Abs 03/30/2014 10.6* 1.7 - 7.7 K/uL Final  . Lymphocytes Relative 03/30/2014 4* 12 - 46 % Final  . Lymphs Abs 03/30/2014 0.5* 0.7 - 4.0 K/uL Final  . Monocytes Relative 03/30/2014 5  3 - 12 % Final  . Monocytes Absolute 03/30/2014 0.6  0.1 - 1.0 K/uL Final  . Eosinophils Relative 03/30/2014 0  0 - 5 % Final  . Eosinophils Absolute 03/30/2014 0.0  0.0 - 0.7 K/uL Final  . Basophils Relative 03/30/2014 0  0 - 1 % Final  . Basophils Absolute 03/30/2014 0.0  0.0 - 0.1 K/uL Final  . Sodium 03/30/2014 138  137 - 147 mEq/L Final  . Potassium 03/30/2014 4.7  3.7 - 5.3 mEq/L Final  . Chloride 03/30/2014 100  96 - 112 mEq/L Final  . CO2 03/30/2014 24  19 - 32 mEq/L Final  . Glucose, Bld 03/30/2014 202* 70 - 99 mg/dL Final  . BUN 03/30/2014 24* 6 - 23 mg/dL Final  . Creatinine, Ser 03/30/2014 0.86  0.50 - 1.10 mg/dL Final  . Calcium 03/30/2014 10.2  8.4 - 10.5 mg/dL Final  . Total Protein 03/30/2014 7.1  6.0 - 8.3 g/dL Final  . Albumin 03/30/2014 3.9  3.5 - 5.2 g/dL Final  . AST 03/30/2014 15  0 - 37 U/L Final  . ALT 03/30/2014 20  0 - 35 U/L Final  . Alkaline Phosphatase 03/30/2014 142* 39 - 117 U/L Final  . Total Bilirubin 03/30/2014 0.3  0.3 - 1.2 mg/dL Final  . GFR calc non Af Amer 03/30/2014 75* >90 mL/min Final  . GFR calc Af Amer 03/30/2014 87* >90 mL/min Final   Comment: (NOTE)                          The eGFR has been calculated using the CKD EPI equation.                          This calculation has not been validated in all clinical situations.                          eGFR's persistently <90 mL/min signify possible Chronic Kidney                          Disease.  Marland Kitchen CA 27.29 03/30/2014 38  0 - 39 U/mL Final   Performed at [facility]: No new pathology.  Urinalysis No results found for  this basename: colorurine,  appearanceur,  labspec,  phurine,  glucoseu,  hgbur,  bilirubinur,  ketonesur,  proteinur,  urobilinogen,  nitrite,  leukocytesur    RADIOGRAPHIC STUDIES: Repeat MUGA scan will be done in 2 weeks.  ASSESSMENT:  #1. Cycle 6 of chemotherapy today, omitting doxorubicin  followed by Neulasta tomorrow. Magic mouthwash prophylactically. Stage I carcinoma left breast, triple negative.  #2. Flonase inhaler 2 puffs in each nostril at bedtime. Continue Claritin 10 mg daily.  #3. Vasomotor instability, improved.  #4. Followup in 3 weeks to receive cycle #5 of treatment. Timing may not be exact based on patient's traveling needs.  #5. 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 #6. Recurrent vaginal candidiasis, for prophylactic Diflucan plus topical Lotrisone. #7. Left breast drainage, resolved.    PLAN:  #1. Cycle 6 of chemotherapy utilizing TC today. This represents completion of her adjuvant chemotherapy. #2. Recurrent vaginal candidiasis, prophylactic Diflucan for 5 days at 200 mg daily along with topical Lotrisone. #3. MUGA scan in 2 weeks. #4. Followup in 3 weeks with CBC. #5. Radiation therapy to begin on 05/08/2014 in [place].   All questions were answered. The patient knows to call the clinic with any problems, questions or concerns. We can certainly see the patient much sooner if necessary.   I spent 25 minutes counseling the patient face to face. The total time spent in the appointment was 30 minutes.    Farrel Gobble, MD 04/20/2014 9:44 AM  DISCLAIMER:  This note was dictated with voice recognition software.  Similar sounding words can inadvertently be transcribed inaccurately and may not be corrected upon review.

## 2014-04-21 ENCOUNTER — Encounter (HOSPITAL_BASED_OUTPATIENT_CLINIC_OR_DEPARTMENT_OTHER): Payer: BC Managed Care – PPO

## 2014-04-21 VITALS — BP 117/83 | HR 85 | Temp 97.6°F | Resp 16

## 2014-04-21 DIAGNOSIS — C50419 Malignant neoplasm of upper-outer quadrant of unspecified female breast: Secondary | ICD-10-CM

## 2014-04-21 DIAGNOSIS — Z5189 Encounter for other specified aftercare: Secondary | ICD-10-CM

## 2014-04-21 DIAGNOSIS — C50912 Malignant neoplasm of unspecified site of left female breast: Secondary | ICD-10-CM

## 2014-04-21 MED ORDER — PEGFILGRASTIM INJECTION 6 MG/0.6ML
SUBCUTANEOUS | Status: AC
  Filled 2014-04-21: qty 0.6

## 2014-04-21 MED ORDER — PEGFILGRASTIM INJECTION 6 MG/0.6ML
6.0000 mg | Freq: Once | SUBCUTANEOUS | Status: AC
  Administered 2014-04-21: 6 mg via SUBCUTANEOUS

## 2014-04-21 NOTE — Progress Notes (Signed)
Sally [name] presents today for injection per MD orders. Neulasta 6mg  administered SQ in right Abdomen. Administration without incident. Patient tolerated well.

## 2014-04-27 ENCOUNTER — Encounter (HOSPITAL_COMMUNITY): Payer: Self-pay

## 2014-04-27 ENCOUNTER — Encounter (HOSPITAL_COMMUNITY)
Admission: RE | Admit: 2014-04-27 | Discharge: 2014-04-27 | Disposition: A | Discharge status: Home / Self Care | Payer: BC Managed Care – PPO | Source: Ambulatory Visit | Attending: Hematology and Oncology | Admitting: Hematology and Oncology

## 2014-04-27 DIAGNOSIS — C50919 Malignant neoplasm of unspecified site of unspecified female breast: Secondary | ICD-10-CM | POA: Insufficient documentation

## 2014-04-27 DIAGNOSIS — C50912 Malignant neoplasm of unspecified site of left female breast: Secondary | ICD-10-CM

## 2014-04-27 MED ORDER — TECHNETIUM TC 99M-LABELED RED BLOOD CELLS IV KIT
25.0000 | PACK | Freq: Once | INTRAVENOUS | Status: AC | PRN
  Administered 2014-04-27: 27 via INTRAVENOUS

## 2014-04-27 MED ORDER — HEPARIN SOD (PORK) LOCK FLUSH 100 UNIT/ML IV SOLN
INTRAVENOUS | Status: AC
  Filled 2014-04-27: qty 5

## 2014-05-11 ENCOUNTER — Encounter (HOSPITAL_BASED_OUTPATIENT_CLINIC_OR_DEPARTMENT_OTHER): Payer: BC Managed Care – PPO

## 2014-05-11 ENCOUNTER — Encounter (HOSPITAL_COMMUNITY): Payer: Self-pay

## 2014-05-11 ENCOUNTER — Encounter (HOSPITAL_COMMUNITY): Payer: BC Managed Care – PPO | Attending: Hematology and Oncology

## 2014-05-11 VITALS — BP 130/88 | HR 101 | Temp 97.8°F | Resp 20 | Wt 155.7 lb

## 2014-05-11 DIAGNOSIS — C50919 Malignant neoplasm of unspecified site of unspecified female breast: Secondary | ICD-10-CM | POA: Insufficient documentation

## 2014-05-11 DIAGNOSIS — C50219 Malignant neoplasm of upper-inner quadrant of unspecified female breast: Secondary | ICD-10-CM

## 2014-05-11 DIAGNOSIS — Z171 Estrogen receptor negative status [ER-]: Secondary | ICD-10-CM

## 2014-05-11 DIAGNOSIS — B373 Candidiasis of vulva and vagina: Secondary | ICD-10-CM

## 2014-05-11 DIAGNOSIS — C50912 Malignant neoplasm of unspecified site of left female breast: Secondary | ICD-10-CM

## 2014-05-11 DIAGNOSIS — B3731 Acute candidiasis of vulva and vagina: Secondary | ICD-10-CM

## 2014-05-11 LAB — COMPREHENSIVE METABOLIC PANEL
ALT: 22 U/L (ref 0–35)
AST: 23 U/L (ref 0–37)
Albumin: 3.8 g/dL (ref 3.5–5.2)
Alkaline Phosphatase: 150 U/L — ABNORMAL HIGH (ref 39–117)
BILIRUBIN TOTAL: 0.3 mg/dL (ref 0.3–1.2)
BUN: 16 mg/dL (ref 6–23)
CALCIUM: 9.8 mg/dL (ref 8.4–10.5)
CHLORIDE: 101 meq/L (ref 96–112)
CO2: 27 mEq/L (ref 19–32)
CREATININE: 0.92 mg/dL (ref 0.50–1.10)
GFR calc Af Amer: 80 mL/min — ABNORMAL LOW (ref >90)
GFR calc non Af Amer: 69 mL/min — ABNORMAL LOW (ref >90)
Glucose, Bld: 135 mg/dL — ABNORMAL HIGH (ref 70–99)
Potassium: 3.9 mEq/L (ref 3.7–5.3)
Sodium: 141 mEq/L (ref 137–147)
Total Protein: 6.8 g/dL (ref 6.0–8.3)

## 2014-05-11 LAB — CBC WITH DIFFERENTIAL/PLATELET
BASOS ABS: 0 10*3/uL (ref 0.0–0.1)
BASOS PCT: 0 % (ref 0–1)
EOS ABS: 0.4 10*3/uL (ref 0.0–0.7)
EOS PCT: 10 % — AB (ref 0–5)
HCT: 33.4 % — ABNORMAL LOW (ref 36.0–46.0)
Hemoglobin: 11.1 g/dL — ABNORMAL LOW (ref 12.0–15.0)
Lymphocytes Relative: 11 % — ABNORMAL LOW (ref 12–46)
Lymphs Abs: 0.5 10*3/uL — ABNORMAL LOW (ref 0.7–4.0)
MCH: 32.8 pg (ref 26.0–34.0)
MCHC: 33.2 g/dL (ref 30.0–36.0)
MCV: 98.8 fL (ref 78.0–100.0)
MONOS PCT: 17 % — AB (ref 3–12)
Monocytes Absolute: 0.8 10*3/uL (ref 0.1–1.0)
Neutro Abs: 2.9 10*3/uL (ref 1.7–7.7)
Neutrophils Relative %: 62 % (ref 43–77)
Platelets: 233 10*3/uL (ref 150–400)
RBC: 3.38 MIL/uL — ABNORMAL LOW (ref 3.87–5.11)
RDW: 16.6 % — AB (ref 11.5–15.5)
WBC: 4.6 10*3/uL (ref 4.0–10.5)

## 2014-05-11 MED ORDER — LIDOCAINE-PRILOCAINE 2.5-2.5 % EX CREA: TOPICAL_CREAM | CUTANEOUS | Status: DC

## 2014-05-11 NOTE — Progress Notes (Signed)
[facility]  OFFICE PROGRESS [name], MD [address] [place] [address]  DIAGNOSIS: Invasive ductal carcinoma of left breast - Plan: CBC with Differential, Comprehensive metabolic panel, lidocaine-prilocaine (EMLA) cream, CBC with Differential  Chief Complaint  Patient presents with  . Breast Cancer    CURRENT THERAPY: Completed 6 cycles of adjuvant chemotherapy, 3 cycles with TAC and 3 cycles with TC due to documented drop in ejection fraction which normalized at the conclusion of therapy.  INTERVAL HISTORY: Sally [name] 58 y.o. female returns for followup and referral to radiotherapy for adjuvant treatment to remaining left breast tissue. She develop hives any the right breast and also in the midportion of her back. She denies any mucositis. She denies any fever, night sweats, peripheral paresthesias, diarrhea, constipation, melena, hematochezia, hematuria, headache, or seizures. She denies PND, orthopnea, palpitations, but has had some medial elbow soreness for the last 3 days without swelling or redness.  MEDICAL HISTORY: Past Medical History  Diagnosis Date  . Chronic sinusitis   . Hypertension   . Diabetes mellitus without complication     diet controlled  . Cancer 11/12/2013    left breast cancer  . 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  Outpatient MUGA scan performed on 02/25/2014 showed a drop in ejection fraction from 72% to 59% and for that reason doxorubicin was discontinued from her chemotherapy regimen starting on cycle 4.     INTERIM HISTORY: has GANGLION-HAND/WRIST; Invasive ductal carcinoma of left breast; Vasomotor instability; and Chemotherapy induced cardiomyopathy on her problem list.   Invasive ductal carcinoma of left breast  09/24/2013  Initial Diagnosis  Invasive ductal carcinoma of left breast    11/12/2013  Surgery  Left breast lumpectomy by Dr. Arnoldo Morale- High grade invasive ductal carcinoma less than 0.1 cm from posterior and medial margins, 0/1 lymph nodes, triple negative  12/31/2013 -  Chemotherapy  TAC every 21 days. Adriamycin dropped following cycle 3 for a decreased EF compared to baseline 3 cycles of TC alone with Neulasta support. Repeat EF on 04/27/2014 70%.    ALLERGIES:  has No Known Allergies.  MEDICATIONS: has a current medication list which includes the following prescription(s): atorvastatin, clotrimazole-betamethasone, first-dukes mouthwash, fluticasone, hydrocodone-acetaminophen, lidocaine-prilocaine, loratadine, magnesium hydroxide, metformin, triamterene-hydrochlorothiazide, ibuprofen, metoclopramide, prochlorperazine, and venlafaxine xr.  SURGICAL HISTORY:  Past Surgical History  Procedure Laterality Date  . Ectopic pregnancy surgery    . Abdominal hysterectomy    . Left leg surgery      17 yrs of age  . Ganglion cyst excision    . Dilation and curettage of uterus    . Colonoscopy  01/09/2012    Procedure: COLONOSCOPY;  Surgeon: Dorothyann Peng, MD;  Location: [facility];  Service: Endoscopy;  Laterality: N/A;  1:30 pm  . Inguinal hernia repair Left   . Partial mastectomy with needle localization and axillary sentinel lymph node bx Left 11/12/2013    Procedure: PARTIAL MASTECTOMY WITH NEEDLE LOCALIZATION AND AXILLARY SENTINEL LYMPH NODE BX;  Surgeon: Jamesetta So, MD;  Location: [facility];  Service: General;  Laterality: Left;  . Portacath placement Right 12/29/2013    Procedure: INSERTION PORT-A-CATH;  Surgeon: Jamesetta So, MD;  Location: [facility];  Service: General;  Laterality: Right;    FAMILY HISTORY: family history includes Diabetes type II in her father and mother; Prostate cancer in her  father.  SOCIAL HISTORY:  reports that she has been smoking Cigarettes.  She has a 3.75 pack-year smoking history. She has never used smokeless tobacco. She reports  that she drinks alcohol. She reports that she does not use illicit drugs.  REVIEW OF SYSTEMS:  Other than that discussed above is noncontributory.  PHYSICAL EXAMINATION: ECOG PERFORMANCE STATUS: 1 - Symptomatic but completely ambulatory  Blood pressure 130/88, pulse 101, temperature 97.8 F (36.6 C), temperature source Oral, resp. rate 20, weight 155 lb 11.2 oz (70.625 kg).  GENERAL:alert, no distress and comfortable. Alopecia the scalp. SKIN: skin color, texture, turgor are normal, no rashes or significant lesions. Minimal evidence of previous eyes beneath the right breast and in the midportion of her lumbar spine. EYES: PERLA; Conjunctiva are pink and non-injected, sclera clear SINUSES: No redness or tenderness over maxillary or ethmoid sinuses OROPHARYNX:no exudate, no erythema on lips, buccal mucosa, or tongue. NECK: supple, thyroid normal size, non-tender, without nodularity. No masses CHEST: Status post left breast lumpectomy with no masses in either breast. LYMPH:  no palpable lymphadenopathy in the cervical, axillary or inguinal LUNGS: clear to auscultation and percussion with normal breathing effort HEART: regular rate & rhythm and no murmurs. ABDOMEN:abdomen soft, non-tender and normal bowel sounds MUSCULOSKELETAL:no cyanosis of digits and no clubbing. Range of motion normal.  NEURO: alert & oriented x 3 with fluent speech, no focal motor/sensory deficits   LABORATORY DATA: Appointment on 05/11/2014  Component Date Value Ref Range Status  . WBC 05/11/2014 4.6  4.0 - 10.5 K/uL Final  . RBC 05/11/2014 3.38* 3.87 - 5.11 MIL/uL Final  . Hemoglobin 05/11/2014 11.1* 12.0 - 15.0 g/dL Final  . HCT 05/11/2014 33.4* 36.0 - 46.0 % Final  . MCV 05/11/2014 98.8  78.0 - 100.0 fL Final  . MCH 05/11/2014 32.8  26.0 - 34.0 pg Final  . MCHC 05/11/2014 33.2  30.0 - 36.0 g/dL Final  . RDW 05/11/2014 16.6* 11.5 - 15.5 % Final  . Platelets 05/11/2014 233  150 - 400 K/uL Final  . Neutrophils  Relative % 05/11/2014 62  43 - 77 % Final  . Neutro Abs 05/11/2014 2.9  1.7 - 7.7 K/uL Final  . Lymphocytes Relative 05/11/2014 11* 12 - 46 % Final  . Lymphs Abs 05/11/2014 0.5* 0.7 - 4.0 K/uL Final  . Monocytes Relative 05/11/2014 17* 3 - 12 % Final  . Monocytes Absolute 05/11/2014 0.8  0.1 - 1.0 K/uL Final  . Eosinophils Relative 05/11/2014 10* 0 - 5 % Final  . Eosinophils Absolute 05/11/2014 0.4  0.0 - 0.7 K/uL Final  . Basophils Relative 05/11/2014 0  0 - 1 % Final  . Basophils Absolute 05/11/2014 0.0  0.0 - 0.1 K/uL Final  Infusion on 04/20/2014  Component Date Value Ref Range Status  . WBC 04/20/2014 8.8  4.0 - 10.5 K/uL Final  . RBC 04/20/2014 3.29* 3.87 - 5.11 MIL/uL Final  . Hemoglobin 04/20/2014 10.6* 12.0 - 15.0 g/dL Final  . HCT 04/20/2014 31.8* 36.0 - 46.0 % Final  . MCV 04/20/2014 96.7  78.0 - 100.0 fL Final  . MCH 04/20/2014 32.2  26.0 - 34.0 pg Final  . MCHC 04/20/2014 33.3  30.0 - 36.0 g/dL Final  . RDW 04/20/2014 17.9* 11.5 - 15.5 % Final  . Platelets 04/20/2014 253  150 - 400 K/uL Final  . Neutrophils Relative % 04/20/2014 88* 43 - 77 % Final  . Neutro Abs 04/20/2014 7.7  1.7 - 7.7 K/uL Final  . Lymphocytes  Relative 04/20/2014 6* 12 - 46 % Final  . Lymphs Abs 04/20/2014 0.6* 0.7 - 4.0 K/uL Final  . Monocytes Relative 04/20/2014 6  3 - 12 % Final  . Monocytes Absolute 04/20/2014 0.5  0.1 - 1.0 K/uL Final  . Eosinophils Relative 04/20/2014 0  0 - 5 % Final  . Eosinophils Absolute 04/20/2014 0.0  0.0 - 0.7 K/uL Final  . Basophils Relative 04/20/2014 0  0 - 1 % Final  . Basophils Absolute 04/20/2014 0.0  0.0 - 0.1 K/uL Final  . Sodium 04/20/2014 138  137 - 147 mEq/L Final  . Potassium 04/20/2014 4.6  3.7 - 5.3 mEq/L Final  . Chloride 04/20/2014 99  96 - 112 mEq/L Final  . CO2 04/20/2014 25  19 - 32 mEq/L Final  . Glucose, Bld 04/20/2014 219* 70 - 99 mg/dL Final  . BUN 04/20/2014 22  6 - 23 mg/dL Final  . Creatinine, Ser 04/20/2014 0.92  0.50 - 1.10 mg/dL Final  .  Calcium 04/20/2014 10.0  8.4 - 10.5 mg/dL Final  . Total Protein 04/20/2014 7.2  6.0 - 8.3 g/dL Final  . Albumin 04/20/2014 4.1  3.5 - 5.2 g/dL Final  . AST 04/20/2014 23  0 - 37 U/L Final  . ALT 04/20/2014 28  0 - 35 U/L Final  . Alkaline Phosphatase 04/20/2014 160* 39 - 117 U/L Final  . Total Bilirubin 04/20/2014 0.4  0.3 - 1.2 mg/dL Final  . GFR calc non Af Amer 04/20/2014 69* >90 mL/min Final  . GFR calc Af Amer 04/20/2014 80* >90 mL/min Final   Comment: (NOTE)                          The eGFR has been calculated using the CKD EPI equation.                          This calculation has not been validated in all clinical situations.                          eGFR's persistently <90 mL/min signify possible Chronic Kidney                          Disease.    PATHOLOGY: No new pathology. Tumor is triple negative.  Urinalysis No results found for this basename: colorurine,  appearanceur,  labspec,  phurine,  glucoseu,  hgbur,  bilirubinur,  ketonesur,  proteinur,  urobilinogen,  nitrite,  leukocytesur    RADIOGRAPHIC STUDIES: Nm Cardiac Muga Rest  04/27/2014   CLINICAL DATA:  Breast cancer with ongoing chemotherapy and previous MUGA study demonstrating reduction in left ventricular ejection fraction.  EXAM: NUCLEAR MEDICINE CARDIAC BLOOD POOL IMAGING (MUGA)  TECHNIQUE: Cardiac multi-gated acquisition was performed at rest following intravenous injection of Tc-31mlabeled red blood cells.  RADIOPHARMACEUTICALS:  27MILLI CURIE ULTRATAG TECHNETIUM TC 40M-LABELED RED BLOOD CELLS IV KITmCiTc-970mn-vitro labeled red blood cells.  COMPARISON:  NM CARDIA MUGA REST dated 02/25/2014; NM CARDIA MUGA REST dated 12/23/2013  FINDINGS: Cine evaluation shows normal left ventricular wall motion. Calculated resting left ventricular ejection fraction is 70%. This is within normal limits.  IMPRESSION: Normal left ventricular ejection fraction calculation of 70%.   Electronically Signed   By: GlAletta Edouard.D.    On: 04/27/2014 16:34    ASSESSMENT:  #1. Cycle 6 of chemotherapy  3 weeks ago omitting doxorubicin followed by Neulasta the day after. Magic mouthwash prophylactically. Stage I carcinoma left breast, triple negative, no evidence of disease #2. Flonase inhaler 2 puffs in each nostril at bedtime. Continue Claritin 10 mg daily.  #3. Vasomotor instability, improved.  #4. Followup in 3 weeks to receive cycle #5 of treatment. Timing may not be exact based on patient's traveling needs.  #5. 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 #6. Recurrent vaginal candidiasis, for prophylactic Diflucan plus topical Lotrisone.  #7. Left breast drainage, resolved.  #8. Urticaria, resolving.      PLAN:  #1. Return to work on 05/18/2014. Patient works overnight. #2. Radiotherapy to commence on 05/19/2014 at 8:15 each morning on returning home from work. #3. Office visit 2 months with CBC.   All questions were answered. The patient knows to call the clinic with any problems, questions or concerns. We can certainly see the patient much sooner if necessary.   I spent 25 minutes counseling the patient face to face. The total time spent in the appointment was 30 minutes.    Farrel Gobble, MD 05/11/2014 11:03 AM  DISCLAIMER:  This note was dictated with voice recognition software.  Similar sounding words can inadvertently be transcribed inaccurately and may not be corrected upon review.

## 2014-05-11 NOTE — Patient Instructions (Addendum)
[facility] Discharge Instructions  RECOMMENDATIONS MADE BY THE CONSULTANT AND ANY TEST RESULTS WILL BE SENT TO YOUR REFERRING PHYSICIAN.  EXAM FINDINGS BY THE PHYSICIAN TODAY AND SIGNS OR SYMPTOMS TO REPORT TO CLINIC OR PRIMARY PHYSICIAN: Exam and findings as discussed by Dr. Barnet Glasgow.  You are doing well and you will start radiation as scheduled next week.  Report any new lumps, bone pain, shortness of breath or other symptoms.  MEDICATIONS PRESCRIBED:  none  INSTRUCTIONS/FOLLOW-UP: Port flushes ever 6 weeks. Follow-up in 2 months with labs and office visit.  Thank you for choosing [facility] to provide your oncology and hematology care.  To afford each patient quality time with our providers, please arrive at least 15 minutes before your scheduled appointment time.  With your help, our goal is to use those 15 minutes to complete the necessary work-up to ensure our physicians have the information they need to help with your evaluation and healthcare recommendations.    Effective January 1st, 2014, we ask that you re-schedule your appointment with our physicians should you arrive 10 or more minutes late for your appointment.  We strive to give you quality time with our providers, and arriving late affects you and other patients whose appointments are after yours.    Again, thank you for choosing [facility].  Our hope is that these requests will decrease the amount of time that you wait before being seen by our physicians.       _____________________________________________________________  Should you have questions after your visit to [facility], please contact our office at [phone] between the hours of 8:30 a.m. and 5:00 p.m.  Voicemails left after 4:30 p.m. will not be returned until the following business day.  For prescription refill requests, have your pharmacy contact our office with your prescription refill request.

## 2014-05-14 NOTE — Progress Notes (Signed)
Labs drawn

## 2014-05-25 DIAGNOSIS — L509 Urticaria, unspecified: Secondary | ICD-10-CM

## 2014-05-25 HISTORY — DX: Urticaria, unspecified: L50.9

## 2014-06-01 ENCOUNTER — Encounter (HOSPITAL_COMMUNITY): Payer: BC Managed Care – PPO

## 2014-06-04 ENCOUNTER — Encounter (HOSPITAL_COMMUNITY): Payer: BC Managed Care – PPO | Attending: Hematology and Oncology

## 2014-06-04 DIAGNOSIS — Z452 Encounter for adjustment and management of vascular access device: Secondary | ICD-10-CM

## 2014-06-04 DIAGNOSIS — C50919 Malignant neoplasm of unspecified site of unspecified female breast: Secondary | ICD-10-CM | POA: Insufficient documentation

## 2014-06-04 DIAGNOSIS — C50219 Malignant neoplasm of upper-inner quadrant of unspecified female breast: Secondary | ICD-10-CM

## 2014-06-04 MED ORDER — HEPARIN SOD (PORK) LOCK FLUSH 100 UNIT/ML IV SOLN
500.0000 [IU] | Freq: Once | INTRAVENOUS | Status: AC
  Administered 2014-06-04: 500 [IU] via INTRAVENOUS
  Filled 2014-06-04: qty 5

## 2014-06-04 MED ORDER — SODIUM CHLORIDE 0.9 % IJ SOLN
10.0000 mL | INTRAMUSCULAR | Status: DC | PRN
  Administered 2014-06-04: 10 mL via INTRAVENOUS

## 2014-06-04 NOTE — Progress Notes (Signed)
Sally [name] presented for Portacath access and flush. Portacath located lt chest wall accessed with  H 20 needle. Good blood return present. Portacath flushed with 66ml NS and 500U/15ml Heparin and needle removed intact. Procedure without incident. Patient tolerated procedure well.

## 2014-07-06 ENCOUNTER — Encounter (HOSPITAL_COMMUNITY): Payer: BC Managed Care – PPO

## 2014-07-06 ENCOUNTER — Encounter (HOSPITAL_COMMUNITY): Payer: BC Managed Care – PPO | Attending: Hematology and Oncology

## 2014-07-06 ENCOUNTER — Encounter (HOSPITAL_COMMUNITY): Payer: Self-pay

## 2014-07-06 VITALS — BP 121/85 | HR 89 | Temp 97.9°F | Resp 16 | Wt 154.8 lb

## 2014-07-06 DIAGNOSIS — C50419 Malignant neoplasm of upper-outer quadrant of unspecified female breast: Secondary | ICD-10-CM

## 2014-07-06 DIAGNOSIS — C50912 Malignant neoplasm of unspecified site of left female breast: Secondary | ICD-10-CM

## 2014-07-06 DIAGNOSIS — Z171 Estrogen receptor negative status [ER-]: Secondary | ICD-10-CM

## 2014-07-06 DIAGNOSIS — L509 Urticaria, unspecified: Secondary | ICD-10-CM

## 2014-07-06 DIAGNOSIS — C50919 Malignant neoplasm of unspecified site of unspecified female breast: Secondary | ICD-10-CM | POA: Insufficient documentation

## 2014-07-06 LAB — CBC WITH DIFFERENTIAL/PLATELET
Basophils Absolute: 0 10*3/uL (ref 0.0–0.1)
Basophils Relative: 1 % (ref 0–1)
Eosinophils Absolute: 0.3 10*3/uL (ref 0.0–0.7)
Eosinophils Relative: 9 % — ABNORMAL HIGH (ref 0–5)
HCT: 39 % (ref 36.0–46.0)
HEMOGLOBIN: 13.6 g/dL (ref 12.0–15.0)
LYMPHS PCT: 23 % (ref 12–46)
Lymphs Abs: 0.7 10*3/uL (ref 0.7–4.0)
MCH: 31.5 pg (ref 26.0–34.0)
MCHC: 34.9 g/dL (ref 30.0–36.0)
MCV: 90.3 fL (ref 78.0–100.0)
MONOS PCT: 9 % (ref 3–12)
Monocytes Absolute: 0.3 10*3/uL (ref 0.1–1.0)
Neutro Abs: 1.9 10*3/uL (ref 1.7–7.7)
Neutrophils Relative %: 58 % (ref 43–77)
PLATELETS: 197 10*3/uL (ref 150–400)
RBC: 4.32 MIL/uL (ref 3.87–5.11)
RDW: 12.6 % (ref 11.5–15.5)
WBC: 3.2 10*3/uL — AB (ref 4.0–10.5)

## 2014-07-06 MED ORDER — HEPARIN SOD (PORK) LOCK FLUSH 100 UNIT/ML IV SOLN
500.0000 [IU] | Freq: Once | INTRAVENOUS | Status: AC
  Administered 2014-07-06: 500 [IU] via INTRAVENOUS
  Filled 2014-07-06: qty 5

## 2014-07-06 MED ORDER — SODIUM CHLORIDE 0.9 % IJ SOLN
10.0000 mL | INTRAMUSCULAR | Status: DC | PRN
Start: 2014-07-06 — End: 2014-07-06
  Administered 2014-07-06: 10 mL via INTRAVENOUS

## 2014-07-06 NOTE — Progress Notes (Signed)
[place] [facility]  OFFICE PROGRESS [name], MD [address] [place] [address]  DIAGNOSIS: Invasive ductal carcinoma of left breast - Plan: Comprehensive metabolic panel, CEA, Cancer antigen 27.29, Vitamin D 25 hydroxy, Schedule Portacath Flush Appointment, heparin lock flush 100 unit/mL, sodium chloride 0.9 % injection 10 mL  Chief Complaint  Patient presents with  . Left stage I triple negative breast cancer    CURRENT THERAPY: Completed 6 cycles of adjuvant chemotherapy, 3 cycles with TAC and 3 cycles with TC do to document a drop in ejection fraction which normalized at the conclusion of therapy. Radiotherapy was started on 05/12/2014.   INTERVAL HISTORY: Sally [name] 58 y.o. female returns for followup of stage I triple-negative left breast cancer, status post 6 cycles of chemotherapy--3 cycles of TAC and 3 cycles of TC due to drop in ejection fraction which normalized after completion of treatment. She started radiotherapy on 05/12/2014 completed on 07/03/2014. She developed an erythematous hyperpigmented reaction to the radiation therapy but without blistering or left upper extremity swelling. She has develop hives involving various portions of her body not controlled well with loratadine 10 mg daily. She denies any hot flashes, vaginal discharge or bleeding, melena, hematochezia, hematuria, peripheral paresthesias, PND, orthopnea, palpitations, headache, or seizures. The patient has been working full-time since [employer].  MEDICAL HISTORY: Past Medical History  Diagnosis Date  . Chronic sinusitis   . Hypertension   . Diabetes mellitus without complication     diet controlled  . Cancer 11/12/2013    left breast cancer  . 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  Outpatient MUGA scan performed on 02/25/2014 showed a drop in ejection  fraction from 72% to 59% and for that reason doxorubicin was discontinued from her chemotherapy regimen starting on cycle 4.   . Hives 05/2014    INTERIM HISTORY: has GANGLION-HAND/WRIST; Invasive ductal carcinoma of left breast; Vasomotor instability; and Chemotherapy induced cardiomyopathy on her problem list.    ALLERGIES:  has No Known Allergies.  MEDICATIONS: has a current medication list which includes the following prescription(s): atorvastatin, clotrimazole-betamethasone, fluticasone, hydrocodone-acetaminophen, ibuprofen, lidocaine-prilocaine, loratadine, magnesium hydroxide, metformin, triamterene-hydrochlorothiazide, first-dukes mouthwash, metoclopramide, prochlorperazine, and venlafaxine xr, and the following Facility-Administered Medications: sodium chloride.  SURGICAL HISTORY:  Past Surgical History  Procedure Laterality Date  . Ectopic pregnancy surgery    . Abdominal hysterectomy    . Left leg surgery      17 yrs of age  . Ganglion cyst excision    . Dilation and curettage of uterus    . Colonoscopy  01/09/2012    Procedure: COLONOSCOPY;  Surgeon: Dorothyann Peng, MD;  [facility];  Service: Endoscopy;  Laterality: N/A;  1:30 pm  . Inguinal hernia repair Left   . Partial mastectomy with needle localization and axillary sentinel lymph node bx Left 11/12/2013    Procedure: PARTIAL MASTECTOMY WITH NEEDLE LOCALIZATION AND AXILLARY SENTINEL LYMPH NODE BX;  Surgeon: Jamesetta So, MD;  Location: [facility];  Service: General;  Laterality: Left;  . Portacath placement Right 12/29/2013    Procedure: INSERTION PORT-A-CATH;  Surgeon: Jamesetta So, MD;  Location: [facility];  Service: General;  Laterality: Right;    FAMILY HISTORY: family history includes Diabetes type II in her father and mother; Prostate cancer in her father.  SOCIAL HISTORY:  reports that she has been  smoking Cigarettes.  She has a 3.75 pack-year smoking history. She has never used smokeless tobacco. She  reports that she drinks alcohol. She reports that she does not use illicit drugs.  REVIEW OF SYSTEMS:  Other than that discussed above is noncontributory.  PHYSICAL EXAMINATION: ECOG PERFORMANCE STATUS: 1 - Symptomatic but completely ambulatory  Blood pressure 121/85, pulse 89, temperature 97.9 F (36.6 C), temperature source Oral, resp. rate 16, weight 154 lb 12.8 oz (70.217 kg).  GENERAL:alert, no distress and comfortable. Slowly resolving alopecia. SKIN: skin color, texture, turgor are normal, no rashes or significant lesions EYES: PERLA; Conjunctiva are pink and non-injected, sclera clear SINUSES: No redness or tenderness over maxillary or ethmoid sinuses OROPHARYNX:no exudate, no erythema on lips, buccal mucosa, or tongue. NECK: supple, thyroid normal size, non-tender, without nodularity. No masses CHEST: Status post left breast lumpectomy with hyperpigmentation changes of radiation and some prominence of pores on the surface of the left breast. Right breast without mass. LYMPH:  no palpable lymphadenopathy in the cervical, axillary or inguinal LUNGS: clear to auscultation and percussion with normal breathing effort HEART: regular rate & rhythm and no murmurs. ABDOMEN:abdomen soft, non-tender and normal bowel sounds MUSCULOSKELETAL:no cyanosis of digits and no clubbing. Range of motion normal.  NEURO: alert & oriented x 3 with fluent speech, no focal motor/sensory deficits   LABORATORY DATA: Appointment on 07/06/2014  Component Date Value Ref Range Status  . WBC 07/06/2014 3.2* 4.0 - 10.5 K/uL Final  . RBC 07/06/2014 4.32  3.87 - 5.11 MIL/uL Final  . Hemoglobin 07/06/2014 13.6  12.0 - 15.0 g/dL Final  . HCT 07/06/2014 39.0  36.0 - 46.0 % Final  . MCV 07/06/2014 90.3  78.0 - 100.0 fL Final  . MCH 07/06/2014 31.5  26.0 - 34.0 pg Final  . MCHC 07/06/2014 34.9  30.0 - 36.0 g/dL Final  . RDW 07/06/2014 12.6  11.5 - 15.5 % Final  . Platelets 07/06/2014 197  150 - 400 K/uL Final  .  Neutrophils Relative % 07/06/2014 58  43 - 77 % Final  . Neutro Abs 07/06/2014 1.9  1.7 - 7.7 K/uL Final  . Lymphocytes Relative 07/06/2014 23  12 - 46 % Final  . Lymphs Abs 07/06/2014 0.7  0.7 - 4.0 K/uL Final  . Monocytes Relative 07/06/2014 9  3 - 12 % Final  . Monocytes Absolute 07/06/2014 0.3  0.1 - 1.0 K/uL Final  . Eosinophils Relative 07/06/2014 9* 0 - 5 % Final  . Eosinophils Absolute 07/06/2014 0.3  0.0 - 0.7 K/uL Final  . Basophils Relative 07/06/2014 1  0 - 1 % Final  . Basophils Absolute 07/06/2014 0.0  0.0 - 0.1 K/uL Final    PATHOLOGY: No new pathology.  Urinalysis No results found for this basename: colorurine,  appearanceur,  labspec,  phurine,  glucoseu,  hgbur,  bilirubinur,  ketonesur,  proteinur,  urobilinogen,  nitrite,  leukocytesur    RADIOGRAPHIC STUDIES: No results found.  ASSESSMENT:  #1. Stage I triple-negative left breast cancer, status post lumpectomy and sentinel node biopsy, 6 cycles of chemotherapy, 3 with TAC and 3 with TAC, status post radiotherapy completed on 07/03/2014, no evidence of disease. #2. Urticaria, cause obscure. #3. Resolution of vasomotor instability #4. Left ventricular dysfunction, resolved.   PLAN:  #1. Suggest 20 mg a loratadine at bedtime. #2. Followup in 3 months with CBC, chem profile, CEA, and CA 27-29.   All questions were answered. The patient knows to call the clinic with any problems,  questions or concerns. We can certainly see the patient much sooner if necessary.   I spent 25 minutes counseling the patient face to face. The total time spent in the appointment was 30 minutes.    Doroteo Bradford, MD 07/06/2014 12:57 PM  DISCLAIMER:  This note was dictated with voice recognition software.  Similar sounding words can inadvertently be transcribed inaccurately and may not be corrected upon review.

## 2014-07-06 NOTE — Patient Instructions (Signed)
[place] Discharge Instructions  RECOMMENDATIONS MADE BY THE CONSULTANT AND ANY TEST RESULTS WILL BE SENT TO YOUR REFERRING PHYSICIAN.  EXAM FINDINGS BY THE PHYSICIAN TODAY AND SIGNS OR SYMPTOMS TO REPORT TO CLINIC OR PRIMARY PHYSICIAN: Exam and findings as discussed by Dr. Barnet Glasgow.  You are doing well.   Report any new lumps, bone pain, shortness of breath or other symptoms.  MEDICATIONS PRESCRIBED:  Increase loratadine (claritin) to 2 tablets at bedtime to help with the hives.  INSTRUCTIONS/FOLLOW-UP: Port flushes every 6 weeks and office visit and labs in 12 weeks.  Thank you for choosing [facility] to provide your oncology and hematology care.  To afford each patient quality time with our providers, please arrive at least 15 minutes before your scheduled appointment time.  With your help, our goal is to use those 15 minutes to complete the necessary work-up to ensure our physicians have the information they need to help with your evaluation and healthcare recommendations.    Effective January 1st, 2014, we ask that you re-schedule your appointment with our physicians should you arrive 10 or more minutes late for your appointment.  We strive to give you quality time with our providers, and arriving late affects you and other patients whose appointments are after yours.    Again, thank you for choosing [facility].  Our hope is that these requests will decrease the amount of time that you wait before being seen by our physicians.       _____________________________________________________________  Should you have questions after your visit to [facility], please contact our office at [phone] between the hours of 8:30 a.m. and 4:30 p.m.  Voicemails left after 4:30 p.m. will not be returned until the following business day.  For prescription refill requests, have your pharmacy contact our office with your prescription  refill request.    _______________________________________________________________  We hope that we have given you very good care.  You may receive a patient satisfaction survey in the mail, please complete it and return it as soon as possible.  We value your feedback!  _______________________________________________________________  Have you asked about our STAR program?  STAR stands for Survivorship Training and Rehabilitation, and this is a nationally recognized cancer care program that focuses on survivorship and rehabilitation.  Cancer and cancer treatments may cause problems, such as, pain, making you feel tired and keeping you from doing the things that you need or want to do. Cancer rehabilitation can help. Our goal is to reduce these troubling effects and help you have the best quality of life possible.  You may receive a survey from a nurse that asks questions about your current state of health.  Based on the survey results, all eligible patients will be referred to the [facility] program for an evaluation so we can better serve you!  A frequently asked questions sheet is available upon request.

## 2014-07-07 ENCOUNTER — Encounter (HOSPITAL_COMMUNITY): Payer: BC Managed Care – PPO

## 2014-07-13 ENCOUNTER — Encounter (HOSPITAL_COMMUNITY): Payer: BC Managed Care – PPO

## 2014-08-18 ENCOUNTER — Encounter (HOSPITAL_COMMUNITY): Payer: BC Managed Care – PPO | Attending: Hematology and Oncology

## 2014-08-18 DIAGNOSIS — Z9889 Other specified postprocedural states: Secondary | ICD-10-CM | POA: Insufficient documentation

## 2014-08-18 DIAGNOSIS — Z95828 Presence of other vascular implants and grafts: Secondary | ICD-10-CM

## 2014-08-18 DIAGNOSIS — Z452 Encounter for adjustment and management of vascular access device: Secondary | ICD-10-CM

## 2014-08-18 DIAGNOSIS — C50419 Malignant neoplasm of upper-outer quadrant of unspecified female breast: Secondary | ICD-10-CM

## 2014-08-18 MED ORDER — HEPARIN SOD (PORK) LOCK FLUSH 100 UNIT/ML IV SOLN
INTRAVENOUS | Status: AC
  Filled 2014-08-18: qty 5

## 2014-08-18 MED ORDER — SODIUM CHLORIDE 0.9 % IJ SOLN
10.0000 mL | Freq: Once | INTRAMUSCULAR | Status: AC
  Administered 2014-08-18: 10 mL via INTRAVENOUS

## 2014-08-18 MED ORDER — HEPARIN SOD (PORK) LOCK FLUSH 100 UNIT/ML IV SOLN
500.0000 [IU] | Freq: Once | INTRAVENOUS | Status: AC
  Administered 2014-08-18: 500 [IU] via INTRAVENOUS

## 2014-08-18 NOTE — Progress Notes (Signed)
Drusilla Kanner Lavergne presented for Portacath access and flush. Proper placement of portacath confirmed by CXR. Portacath located right chest wall accessed with  H 20 needle. Good blood return present. Portacath flushed with 29ml NS and 500U/52ml Heparin and needle removed intact. Procedure without incident. Patient tolerated procedure well.

## 2014-09-29 ENCOUNTER — Encounter (HOSPITAL_COMMUNITY): Payer: BC Managed Care – PPO

## 2014-10-06 ENCOUNTER — Encounter (HOSPITAL_COMMUNITY): Payer: Self-pay

## 2014-10-06 ENCOUNTER — Encounter (HOSPITAL_COMMUNITY): Payer: BC Managed Care – PPO | Attending: Hematology and Oncology

## 2014-10-06 ENCOUNTER — Encounter (HOSPITAL_BASED_OUTPATIENT_CLINIC_OR_DEPARTMENT_OTHER): Payer: BC Managed Care – PPO

## 2014-10-06 VITALS — BP 126/89 | HR 92 | Temp 98.7°F | Resp 18 | Wt 157.6 lb

## 2014-10-06 DIAGNOSIS — C50912 Malignant neoplasm of unspecified site of left female breast: Secondary | ICD-10-CM

## 2014-10-06 DIAGNOSIS — Z853 Personal history of malignant neoplasm of breast: Secondary | ICD-10-CM

## 2014-10-06 DIAGNOSIS — Z171 Estrogen receptor negative status [ER-]: Secondary | ICD-10-CM

## 2014-10-06 LAB — COMPREHENSIVE METABOLIC PANEL
ALBUMIN: 4.5 g/dL (ref 3.5–5.2)
ALT: 30 U/L (ref 0–35)
AST: 22 U/L (ref 0–37)
Alkaline Phosphatase: 167 U/L — ABNORMAL HIGH (ref 39–117)
Anion gap: 13 (ref 5–15)
BUN: 26 mg/dL — ABNORMAL HIGH (ref 6–23)
CHLORIDE: 100 meq/L (ref 96–112)
CO2: 28 mEq/L (ref 19–32)
CREATININE: 1.06 mg/dL (ref 0.50–1.10)
Calcium: 9.9 mg/dL (ref 8.4–10.5)
GFR calc Af Amer: 67 mL/min — ABNORMAL LOW (ref >90)
GFR calc non Af Amer: 58 mL/min — ABNORMAL LOW (ref >90)
Glucose, Bld: 107 mg/dL — ABNORMAL HIGH (ref 70–99)
Potassium: 3.7 mEq/L (ref 3.7–5.3)
Sodium: 141 mEq/L (ref 137–147)
TOTAL PROTEIN: 8 g/dL (ref 6.0–8.3)
Total Bilirubin: 0.5 mg/dL (ref 0.3–1.2)

## 2014-10-06 LAB — CBC WITH DIFFERENTIAL/PLATELET
BASOS ABS: 0 10*3/uL (ref 0.0–0.1)
Basophils Relative: 0 % (ref 0–1)
Eosinophils Absolute: 0.2 10*3/uL (ref 0.0–0.7)
Eosinophils Relative: 4 % (ref 0–5)
HEMATOCRIT: 38.5 % (ref 36.0–46.0)
Hemoglobin: 13.1 g/dL (ref 12.0–15.0)
Lymphocytes Relative: 31 % (ref 12–46)
Lymphs Abs: 1.4 10*3/uL (ref 0.7–4.0)
MCH: 29.9 pg (ref 26.0–34.0)
MCHC: 34 g/dL (ref 30.0–36.0)
MCV: 87.9 fL (ref 78.0–100.0)
Monocytes Absolute: 0.3 10*3/uL (ref 0.1–1.0)
Monocytes Relative: 7 % (ref 3–12)
NEUTROS ABS: 2.6 10*3/uL (ref 1.7–7.7)
Neutrophils Relative %: 58 % (ref 43–77)
PLATELETS: 186 10*3/uL (ref 150–400)
RBC: 4.38 MIL/uL (ref 3.87–5.11)
RDW: 13.7 % (ref 11.5–15.5)
WBC: 4.6 10*3/uL (ref 4.0–10.5)

## 2014-10-06 MED ORDER — LORATADINE 10 MG PO TABS: 10.0000 mg | ORAL_TABLET | Freq: Every day | ORAL | Status: DC | PRN

## 2014-10-06 MED ORDER — HEPARIN SOD (PORK) LOCK FLUSH 100 UNIT/ML IV SOLN
500.0000 [IU] | Freq: Once | INTRAVENOUS | Status: AC
  Administered 2014-10-06: 500 [IU] via INTRAVENOUS

## 2014-10-06 MED ORDER — HEPARIN SOD (PORK) LOCK FLUSH 100 UNIT/ML IV SOLN
INTRAVENOUS | Status: AC
  Filled 2014-10-06: qty 5

## 2014-10-06 NOTE — Progress Notes (Signed)
Sally Sally [name]  OFFICE PROGRESS Sally Sally [name] [address] [place] [address]  DIAGNOSIS: Invasive ductal carcinoma of left breast - Plan: CBC with Differential, Comprehensive metabolic panel, CEA, Cancer antigen 27.29, Vitamin D 25 hydroxy, CBC with Differential, MM Digital Diagnostic Bilat  Chief Complaint  Patient presents with  . Breast Cancer    CURRENT THERAPY: Completed 6 cycles of adjuvant chemotherapy, 3 cycles with TAC and 3 cycles with TC do to document a drop in ejection fraction which normalized at the conclusion of therapy. Radiotherapy was started on 05/12/2014 and completed on 07/03/2014.    INTERVAL HISTORY: Sally Sally [name] 58 y.o. 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 She started radiotherapy on 05/12/2014 completed on 07/03/2014. She continues to work full-time. She denies any fever, night sweats, vaginal bleeding or discharge, lymphedema, but does have occasional discomfort involving the posterior portion of the thorax. She denies any chest pain, cough, wheezing, sore throat, but still has intermittent itchiness of the upper extremity with occasional hives. Long-term loratadine therapy appears to control her chronic urticaria. Appetite is good with no nausea, vomiting, diarrhea, constipation, skin rash, headache, or seizures. She denies any PND, orthopnea, or palpitations.    MEDICAL HISTORY: Past Medical History  Diagnosis Date  . Chronic sinusitis   . Hypertension   . Diabetes mellitus without complication     diet controlled  . Cancer 11/12/2013    left breast cancer  . 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  Outpatient MUGA scan  performed on 02/25/2014 showed a drop in ejection fraction from 72% to 59% and for that reason doxorubicin was discontinued from her chemotherapy regimen starting on cycle 4.   . Hives 05/2014    INTERIM HISTORY: has GANGLION-HAND/WRIST; Invasive ductal carcinoma of left breast; Vasomotor instability; and Chemotherapy induced cardiomyopathy on her problem list.     Invasive ductal carcinoma of left breast   09/24/2013 Initial Diagnosis Invasive ductal carcinoma of left breast   11/12/2013 Surgery Left breast lumpectomy by Dr. Arnoldo [name]- High grade invasive ductal carcinoma less than 0.1 cm from posterior and medial margins, 0/1 lymph nodes, triple negative   12/31/2013 - 04/20/2014 Chemotherapy TAC every 21 days.  Adriamycin dropped following cycle 3 for a decreased EF compared to baseline.    ALLERGIES:  has No Known Allergies.  MEDICATIONS: has a current medication list which includes the following prescription(s): atorvastatin, fluticasone, ibuprofen, lidocaine-prilocaine, loratadine, magnesium hydroxide, metformin, triamterene-hydrochlorothiazide, venlafaxine xr, clotrimazole-betamethasone, first-dukes mouthwash, hydrocodone-acetaminophen, metoclopramide, and prochlorperazine.  SURGICAL HISTORY:  Past Surgical History  Procedure Laterality Date  . Ectopic pregnancy surgery    . Abdominal hysterectomy    . Left leg surgery      17 yrs of age  . Ganglion cyst excision    . Dilation and curettage of uterus    . Colonoscopy  01/09/2012    Procedure: COLONOSCOPY;  Surgeon: Sally Peng, [name];  Location: [facility];  Service: Endoscopy;  Laterality: N/A;  1:30 pm  . Inguinal hernia repair Left   . Partial mastectomy with needle localization and axillary sentinel lymph node bx Left 11/12/2013    Procedure: PARTIAL MASTECTOMY WITH NEEDLE LOCALIZATION AND AXILLARY SENTINEL LYMPH  NODE BX;  Surgeon: Sally [name], [name];  Location: [facility] ORS;  Service: General;  Laterality: Left;  . Portacath placement  Right 12/29/2013    Procedure: INSERTION PORT-A-CATH;  Surgeon: Sally [name], [name];  Location: [facility] ORS;  Service: General;  Laterality: Right;    FAMILY HISTORY: family history includes Diabetes type II in her father and mother; Prostate cancer in her father.  SOCIAL HISTORY:  reports that she has been smoking Cigarettes.  She has a 3.75 pack-year smoking history. She has never used smokeless tobacco. She reports that she drinks alcohol. She reports that she does not use illicit drugs.  REVIEW OF SYSTEMS:  Other than that discussed above is noncontributory.  PHYSICAL EXAMINATION: ECOG PERFORMANCE STATUS: 1 - Symptomatic but completely ambulatory  Blood pressure 126/89, pulse 92, temperature 98.7 F (37.1 C), temperature source Oral, resp. rate 18, weight 157 lb 9.6 oz (71.487 kg), SpO2 100.00%.  GENERAL:alert, no distress and comfortable SKIN: skin color, texture, turgor are normal, no rashes or significant lesions. No hives seen today. EYES: PERLA; Conjunctiva are pink and non-injected, sclera clear SINUSES: No redness or tenderness over maxillary or ethmoid sinuses OROPHARYNX:no exudate, no erythema on lips, buccal mucosa, or tongue. NECK: supple, thyroid normal size, non-tender, without nodularity. No masses CHEST: Status post left breast lumpectomy with no masses in either breast. Hyperpigmentation changes of radiation is seen in the left breast. LifePort in place. LYMPH:  no palpable lymphadenopathy in the cervical, axillary or inguinal LUNGS: clear to auscultation and percussion with normal breathing effort HEART: regular rate & rhythm and no murmurs. ABDOMEN:abdomen soft, non-tender and normal bowel sounds MUSCULOSKELETAL:no cyanosis of digits and no clubbing. Range of motion normal.  NEURO: alert & oriented x 3 with fluent speech, no focal motor/sensory deficits   LABORATORY DATA: Office Visit on 10/06/2014  Component Date Value Ref Range Status  . WBC 10/06/2014 4.6  4.0 -  10.5 K/uL Final  . RBC 10/06/2014 4.38  3.87 - 5.11 MIL/uL Final  . Hemoglobin 10/06/2014 13.1  12.0 - 15.0 g/dL Final  . HCT 10/06/2014 38.5  36.0 - 46.0 % Final  . MCV 10/06/2014 87.9  78.0 - 100.0 fL Final  . MCH 10/06/2014 29.9  26.0 - 34.0 pg Final  . MCHC 10/06/2014 34.0  30.0 - 36.0 g/dL Final  . RDW 10/06/2014 13.7  11.5 - 15.5 % Final  . Platelets 10/06/2014 186  150 - 400 K/uL Final  . Neutrophils Relative % 10/06/2014 58  43 - 77 % Final  . Neutro Abs 10/06/2014 2.6  1.7 - 7.7 K/uL Final  . Lymphocytes Relative 10/06/2014 31  12 - 46 % Final  . Lymphs Abs 10/06/2014 1.4  0.7 - 4.0 K/uL Final  . Monocytes Relative 10/06/2014 7  3 - 12 % Final  . Monocytes Absolute 10/06/2014 0.3  0.1 - 1.0 K/uL Final  . Eosinophils Relative 10/06/2014 4  0 - 5 % Final  . Eosinophils Absolute 10/06/2014 0.2  0.0 - 0.7 K/uL Final  . Basophils Relative 10/06/2014 0  0 - 1 % Final  . Basophils Absolute 10/06/2014 0.0  0.0 - 0.1 K/uL Final    PATHOLOGY: No new pathology.  Urinalysis No results found for this basename: colorurine,  appearanceur,  labspec,  phurine,  glucoseu,  hgbur,  bilirubinur,  ketonesur,  proteinur,  urobilinogen,  nitrite,  leukocytesur    RADIOGRAPHIC STUDIES: Bilateral diagnostic mammogram order for 10/23/2014  ASSESSMENT:  #1. Stage I triple-negative left breast cancer,  status post lumpectomy and sentinel node biopsy, 6 cycles of chemotherapy, 3 with TAC and 3 with TAC, status post radiotherapy completed on 07/03/2014, no evidence of disease.  #2. Urticaria, cause obscure, controlled with loratadine.  #3. Resolution of vasomotor instability  #4. Left ventricular dysfunction, resolved    PLAN:  #1. Loratadine 10-20 mg daily to control her urticaria and pruritus. #2. Port flush in 6 weeks and in 3 months with followup, CBC, chem profile, CEA, CA 27-29.   All questions were answered. The patient knows to call the clinic with any problems, questions or concerns. We  can certainly see the patient much sooner if necessary.   I spent 25 minutes counseling the patient face to face. The total time spent in the appointment was 30 minutes.    Doroteo Bradford, [name] 10/06/2014 11:27 AM  DISCLAIMER:  This note was dictated with voice recognition software.  Similar sounding words can inadvertently be transcribed inaccurately and may not be corrected upon review.

## 2014-10-06 NOTE — Progress Notes (Unsigned)
Sally [name] presented for Portacath access and flush.  Proper placement of portacath confirmed by CXR.  Portacath located right chest wall accessed with  H 20 needle.  Good blood return present. Portacath flushed with 20ml NS and 500U/5ml Heparin and needle removed intact.  Procedure tolerated well and without incident.    

## 2014-10-06 NOTE — Patient Instructions (Signed)
[place] Discharge Instructions  RECOMMENDATIONS MADE BY THE CONSULTANT AND ANY TEST RESULTS WILL BE SENT TO YOUR REFERRING PHYSICIAN.  EXAM FINDINGS BY THE PHYSICIAN TODAY AND SIGNS OR SYMPTOMS TO REPORT TO CLINIC OR PRIMARY PHYSICIAN: You saw Dr Barnet Glasgow today  Mammogram this month, follow up in 3 months with the doctor and lab work  Thank you for choosing [facility] to provide your oncology and hematology care.  To afford each patient quality time with our providers, please arrive at least 15 minutes before your scheduled appointment time.  With your help, our goal is to use those 15 minutes to complete the necessary work-up to ensure our physicians have the information they need to help with your evaluation and healthcare recommendations.    Effective January 1st, 2014, we ask that you re-schedule your appointment with our physicians should you arrive 10 or more minutes late for your appointment.  We strive to give you quality time with our providers, and arriving late affects you and other patients whose appointments are after yours.    Again, thank you for choosing [facility].  Our hope is that these requests will decrease the amount of time that you wait before being seen by our physicians.       _____________________________________________________________  Should you have questions after your visit to [facility], please contact our office at [phone] between the hours of 8:30 a.m. and 5:00 p.m.  Voicemails left after 4:30 p.m. will not be returned until the following business day.  For prescription refill requests, have your pharmacy contact our office with your prescription refill request.

## 2014-10-07 LAB — CANCER ANTIGEN 27.29: CA 27.29: 38 U/mL (ref 0–39)

## 2014-10-07 LAB — VITAMIN D 25 HYDROXY (VIT D DEFICIENCY, FRACTURES): VIT D 25 HYDROXY: 22 ng/mL — AB (ref 30–89)

## 2014-10-07 LAB — CEA: CEA: 1.4 ng/mL (ref 0.0–5.0)

## 2014-10-20 ENCOUNTER — Encounter (HOSPITAL_COMMUNITY): Payer: BC Managed Care – PPO

## 2014-10-21 ENCOUNTER — Other Ambulatory Visit (HOSPITAL_COMMUNITY): Payer: Self-pay | Admitting: Oncology

## 2014-10-21 ENCOUNTER — Encounter (HOSPITAL_COMMUNITY): Payer: Self-pay | Admitting: Oncology

## 2014-10-21 DIAGNOSIS — Z720 Tobacco use: Secondary | ICD-10-CM

## 2014-10-21 HISTORY — DX: Tobacco use: Z72.0

## 2014-10-21 MED ORDER — BUPROPION HCL ER (SR) 150 MG PO TB12
ORAL_TABLET | ORAL | Status: DC
Start: 2014-10-21 — End: 2014-12-22

## 2014-10-22 ENCOUNTER — Telehealth (HOSPITAL_COMMUNITY): Payer: Self-pay | Admitting: *Deleted

## 2014-10-22 NOTE — Telephone Encounter (Signed)
Spoke with patient. Discussed Wellbutrin instructions. Patient verbalized understanding.

## 2014-10-22 NOTE — Telephone Encounter (Signed)
Message copied by Berneta Levins on Thu Oct 22, 2014 10:44 AM ------      Message from: Baird Cancer      Created: Wed Oct 21, 2014  4:15 PM       I escribed Wellbutrin to her pharmacy.  She will take 150 mg daily x 3 days and then increase to 150 mg BID x 7-12 weeks.  She is to stop smoking on day 5-7.  She is not to take medication past 6 pm.  She should also use Nicorette gum or something like that OTC to help her during this time.  When she has a craving use Nicorette.             KEFALAS,THOMAS      10/21/2014                        ----- Message -----         From: Sally Frame [name], RN         Sent: 10/21/2014   9:06 AM           To: Baird Cancer, PA-C            Patient came by this am with "a monkey on her back". She said she is smoking apx 5-6 cigarettes WEEKLY but needs something to help her stop. She said her insurance premiums are going to go up the first of the year unless she can show she is making a effort to quit. She is enrolled in a smoking cessation class in [place] offered through her employer. She has tried Chantix in the past but it made her nauseated and caused abdominal bloating and some other issues that kept her from taking it. Can we prescribe her something to stop smoking or does she need her primary care to address? Also, she asked about her recent lab work. Can you review these and make any necessary recommendations if needed? Thanks pal!       ------

## 2014-11-10 ENCOUNTER — Encounter (HOSPITAL_COMMUNITY): Payer: BC Managed Care – PPO | Attending: Hematology and Oncology

## 2014-11-10 ENCOUNTER — Ambulatory Visit (HOSPITAL_COMMUNITY)
Admission: RE | Admit: 2014-11-10 | Discharge: 2014-11-10 | Disposition: A | Discharge status: Home / Self Care | Payer: BC Managed Care – PPO | Source: Ambulatory Visit | Attending: Hematology and Oncology | Admitting: Hematology and Oncology

## 2014-11-10 ENCOUNTER — Other Ambulatory Visit (HOSPITAL_COMMUNITY): Payer: Self-pay | Admitting: Oncology

## 2014-11-10 ENCOUNTER — Other Ambulatory Visit (HOSPITAL_COMMUNITY): Payer: Self-pay | Admitting: Hematology and Oncology

## 2014-11-10 ENCOUNTER — Encounter (HOSPITAL_COMMUNITY): Payer: Self-pay

## 2014-11-10 DIAGNOSIS — Z853 Personal history of malignant neoplasm of breast: Secondary | ICD-10-CM | POA: Insufficient documentation

## 2014-11-10 DIAGNOSIS — C50912 Malignant neoplasm of unspecified site of left female breast: Secondary | ICD-10-CM

## 2014-11-10 DIAGNOSIS — Z452 Encounter for adjustment and management of vascular access device: Secondary | ICD-10-CM

## 2014-11-10 MED ORDER — HYDROCODONE-ACETAMINOPHEN 5-325 MG PO TABS: 1.0000 | ORAL_TABLET | Freq: Four times a day (QID) | ORAL | Status: AC | PRN

## 2014-11-10 MED ORDER — HEPARIN SOD (PORK) LOCK FLUSH 100 UNIT/ML IV SOLN
500.0000 [IU] | Freq: Once | INTRAVENOUS | Status: DC
  Filled 2014-11-10: qty 5

## 2014-11-10 MED ORDER — SODIUM CHLORIDE 0.9 % IJ SOLN: 10.0000 mL | INTRAMUSCULAR | Status: DC | PRN

## 2014-11-10 NOTE — Patient Instructions (Signed)
[place] Discharge Instructions  RECOMMENDATIONS MADE BY THE CONSULTANT AND ANY TEST RESULTS WILL BE SENT TO YOUR REFERRING PHYSICIAN.  Port flush today Follow up as scheduled for port flush, lab work, and office visit.  Thank you for choosing [facility] to provide your oncology and hematology care.  To afford each patient quality time with our providers, please arrive at least 15 minutes before your scheduled appointment time.  With your help, our goal is to use those 15 minutes to complete the necessary work-up to ensure our physicians have the information they need to help with your evaluation and healthcare recommendations.    Effective January 1st, 2014, we ask that you re-schedule your appointment with our physicians should you arrive 10 or more minutes late for your appointment.  We strive to give you quality time with our providers, and arriving late affects you and other patients whose appointments are after yours.    Again, thank you for choosing [facility].  Our hope is that these requests will decrease the amount of time that you wait before being seen by our physicians.       _____________________________________________________________  Should you have questions after your visit to [facility], please contact our office at [phone] between the hours of 8:30 a.m. and 4:30 p.m.  Voicemails left after 4:30 p.m. will not be returned until the following business day.  For prescription refill requests, have your pharmacy contact our office with your prescription refill request.    _______________________________________________________________  We hope that we have given you very good care.  You may receive a patient satisfaction survey in the mail, please complete it and return it as soon as possible.  We value your feedback!  _______________________________________________________________  Have you asked about  our STAR program?  STAR stands for Survivorship Training and Rehabilitation, and this is a nationally recognized cancer care program that focuses on survivorship and rehabilitation.  Cancer and cancer treatments may cause problems, such as, pain, making you feel tired and keeping you from doing the things that you need or want to do. Cancer rehabilitation can help. Our goal is to reduce these troubling effects and help you have the best quality of life possible.  You may receive a survey from a nurse that asks questions about your current state of health.  Based on the survey results, all eligible patients will be referred to the [facility] program for an evaluation so we can better serve you!  A frequently asked questions sheet is available upon request.

## 2014-11-10 NOTE — Progress Notes (Signed)
[name] presented for Portacath access and flush.  Portacath located right chest wall accessed with  H 20 needle.  Good blood return present. Portacath flushed with 13ml NS and 500U/86ml Heparin and needle removed intact.  Procedure tolerated well and without incident.

## 2014-12-22 ENCOUNTER — Encounter (HOSPITAL_COMMUNITY): Payer: BC Managed Care – PPO | Attending: Hematology and Oncology

## 2014-12-22 ENCOUNTER — Encounter (HOSPITAL_COMMUNITY): Payer: Self-pay

## 2014-12-22 VITALS — BP 128/93 | HR 91 | Temp 97.9°F | Resp 20 | Wt 166.6 lb

## 2014-12-22 DIAGNOSIS — Z853 Personal history of malignant neoplasm of breast: Secondary | ICD-10-CM | POA: Insufficient documentation

## 2014-12-22 DIAGNOSIS — C50412 Malignant neoplasm of upper-outer quadrant of left female breast: Secondary | ICD-10-CM

## 2014-12-22 DIAGNOSIS — Z452 Encounter for adjustment and management of vascular access device: Secondary | ICD-10-CM

## 2014-12-22 DIAGNOSIS — C50912 Malignant neoplasm of unspecified site of left female breast: Secondary | ICD-10-CM | POA: Insufficient documentation

## 2014-12-22 LAB — CBC WITH DIFFERENTIAL/PLATELET
BASOS PCT: 1 % (ref 0–1)
Basophils Absolute: 0 10*3/uL (ref 0.0–0.1)
EOS ABS: 0.2 10*3/uL (ref 0.0–0.7)
EOS PCT: 4 % (ref 0–5)
HEMATOCRIT: 37.9 % (ref 36.0–46.0)
Hemoglobin: 12.8 g/dL (ref 12.0–15.0)
Lymphocytes Relative: 26 % (ref 12–46)
Lymphs Abs: 1.2 10*3/uL (ref 0.7–4.0)
MCH: 30.8 pg (ref 26.0–34.0)
MCHC: 33.8 g/dL (ref 30.0–36.0)
MCV: 91.3 fL (ref 78.0–100.0)
MONO ABS: 0.3 10*3/uL (ref 0.1–1.0)
Monocytes Relative: 7 % (ref 3–12)
Neutro Abs: 2.9 10*3/uL (ref 1.7–7.7)
Neutrophils Relative %: 62 % (ref 43–77)
Platelets: 171 10*3/uL (ref 150–400)
RBC: 4.15 MIL/uL (ref 3.87–5.11)
RDW: 12.9 % (ref 11.5–15.5)
WBC: 4.7 10*3/uL (ref 4.0–10.5)

## 2014-12-22 LAB — COMPREHENSIVE METABOLIC PANEL
ALT: 34 U/L (ref 0–35)
ANION GAP: 7 (ref 5–15)
AST: 27 U/L (ref 0–37)
Albumin: 4.3 g/dL (ref 3.5–5.2)
Alkaline Phosphatase: 138 U/L — ABNORMAL HIGH (ref 39–117)
BUN: 18 mg/dL (ref 6–23)
CALCIUM: 9.6 mg/dL (ref 8.4–10.5)
CO2: 28 mmol/L (ref 19–32)
Chloride: 105 mEq/L (ref 96–112)
Creatinine, Ser: 1 mg/dL (ref 0.50–1.10)
GFR calc non Af Amer: 62 mL/min — ABNORMAL LOW (ref >90)
GFR, EST AFRICAN AMERICAN: 72 mL/min — AB (ref >90)
Glucose, Bld: 164 mg/dL — ABNORMAL HIGH (ref 70–99)
Potassium: 3.5 mmol/L (ref 3.5–5.1)
SODIUM: 140 mmol/L (ref 135–145)
TOTAL PROTEIN: 7 g/dL (ref 6.0–8.3)
Total Bilirubin: 0.5 mg/dL (ref 0.3–1.2)

## 2014-12-22 MED ORDER — HEPARIN SOD (PORK) LOCK FLUSH 100 UNIT/ML IV SOLN: 500.0000 [IU] | Freq: Once | INTRAVENOUS | Status: DC

## 2014-12-22 MED ORDER — HEPARIN SOD (PORK) LOCK FLUSH 100 UNIT/ML IV SOLN
500.0000 [IU] | Freq: Once | INTRAVENOUS | Status: AC
  Administered 2014-12-22: 500 [IU] via INTRAVENOUS
  Filled 2014-12-22: qty 5

## 2014-12-22 MED ORDER — HEPARIN SOD (PORK) LOCK FLUSH 100 UNIT/ML IV SOLN
INTRAVENOUS | Status: AC
  Filled 2014-12-22: qty 5

## 2014-12-22 MED ORDER — SODIUM CHLORIDE 0.9 % IJ SOLN
10.0000 mL | Freq: Once | INTRAMUSCULAR | Status: AC
  Administered 2014-12-22: 10 mL via INTRAVENOUS
  Filled 2014-12-22: qty 10

## 2014-12-22 MED ORDER — SODIUM CHLORIDE 0.9 % IJ SOLN: 10.0000 mL | INTRAMUSCULAR | Status: DC | PRN

## 2014-12-22 NOTE — Progress Notes (Signed)
Sally [name] presented for Portacath access and flush. Proper placement of portacath confirmed by CXR. Portacath located rt chest wall accessed with  H 20 needle. Good blood return present. Portacath flushed with 103ml NS and 500U/76ml Heparin and needle removed intact. Procedure without incident. Patient tolerated procedure well.

## 2014-12-22 NOTE — Patient Instructions (Signed)
[place] Discharge Instructions  RECOMMENDATIONS MADE BY THE CONSULTANT AND ANY TEST RESULTS WILL BE SENT TO YOUR REFERRING PHYSICIAN.  EXAM FINDINGS BY THE PHYSICIAN TODAY AND SIGNS OR SYMPTOMS TO REPORT TO CLINIC OR PRIMARY PHYSICIAN:   Port flush today with labs  Return in 2 weeks to see Dr. Whitney Muse  Check your breast tissue once a month and let us know if you notice any changes (color of breast, lumps, bumps, etc)   Thank you for choosing [facility] to provide your oncology and hematology care.  To afford each patient quality time with our providers, please arrive at least 15 minutes before your scheduled appointment time.  With your help, our goal is to use those 15 minutes to complete the necessary work-up to ensure our physicians have the information they need to help with your evaluation and healthcare recommendations.    Effective January 1st, 2014, we ask that you re-schedule your appointment with our physicians should you arrive 10 or more minutes late for your appointment.  We strive to give you quality time with our providers, and arriving late affects you and other patients whose appointments are after yours.    Again, thank you for choosing [facility].  Our hope is that these requests will decrease the amount of time that you wait before being seen by our physicians.       _____________________________________________________________  Should you have questions after your visit to [facility], please contact our office at [phone] between the hours of 8:30 a.m. and 4:30 p.m.  Voicemails left after 4:30 p.m. will not be returned until the following business day.  For prescription refill requests, have your pharmacy contact our office with your prescription refill request.    _______________________________________________________________  We hope that we have given you very good care.  You may receive a  patient satisfaction survey in the mail, please complete it and return it as soon as possible.  We value your feedback!  _______________________________________________________________  Have you asked about our STAR program?  STAR stands for Survivorship Training and Rehabilitation, and this is a nationally recognized cancer care program that focuses on survivorship and rehabilitation.  Cancer and cancer treatments may cause problems, such as, pain, making you feel tired and keeping you from doing the things that you need or want to do. Cancer rehabilitation can help. Our goal is to reduce these troubling effects and help you have the best quality of life possible.  You may receive a survey from a nurse that asks questions about your current state of health.  Based on the survey results, all eligible patients will be referred to the [facility] program for an evaluation so we can better serve you!  A frequently asked questions sheet is available upon request.

## 2014-12-23 LAB — CEA: CEA: 1.8 ng/mL (ref 0.0–5.0)

## 2014-12-23 LAB — CANCER ANTIGEN 27.29: CA 27.29: 35 U/mL (ref 0–39)

## 2015-01-06 ENCOUNTER — Encounter (HOSPITAL_COMMUNITY): Payer: Self-pay | Admitting: Hematology & Oncology

## 2015-01-06 ENCOUNTER — Encounter: Payer: Self-pay | Admitting: *Deleted

## 2015-01-06 ENCOUNTER — Encounter (HOSPITAL_COMMUNITY): Payer: BC Managed Care – PPO | Attending: Hematology and Oncology | Admitting: Hematology & Oncology

## 2015-01-06 ENCOUNTER — Other Ambulatory Visit (HOSPITAL_COMMUNITY): Payer: BC Managed Care – PPO

## 2015-01-06 VITALS — BP 147/91 | HR 80 | Temp 98.0°F | Resp 18 | Wt 167.0 lb

## 2015-01-06 DIAGNOSIS — Z853 Personal history of malignant neoplasm of breast: Secondary | ICD-10-CM | POA: Insufficient documentation

## 2015-01-06 DIAGNOSIS — C50912 Malignant neoplasm of unspecified site of left female breast: Secondary | ICD-10-CM | POA: Insufficient documentation

## 2015-01-06 DIAGNOSIS — Z171 Estrogen receptor negative status [ER-]: Secondary | ICD-10-CM

## 2015-01-06 DIAGNOSIS — M549 Dorsalgia, unspecified: Secondary | ICD-10-CM

## 2015-01-06 NOTE — Progress Notes (Signed)
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 Radiotherapy was started on 05/12/2014 and completed on 07/03/2014.   Mammogram 10/2014  CURRENT THERAPY: Observation  INTERVAL HISTORY: Sally [name] 59 y.o. female returns for follow-up of her triple negative breast cancer. She gets occasional breast soreness. She is having trouble transitioning to normal life again in regards to diet, exercise and general activity. She has not thought about support groups.   She has noticed back pain. It concerns her. It does not limit her ADL's but it is persistent.   MEDICAL HISTORY: Past Medical History  Diagnosis Date  . Chronic sinusitis   . Hypertension   . Diabetes mellitus without complication     diet controlled  . Cancer 11/12/2013    left breast cancer  . 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  Outpatient MUGA scan performed on 02/25/2014 showed a drop in ejection fraction from 72% to 59% and for that reason doxorubicin was discontinued from her chemotherapy regimen starting on cycle 4.   . Hives 05/2014  . Tobacco abuse 10/21/2014    has GANGLION-HAND/WRIST; Invasive ductal carcinoma of left breast; Vasomotor instability; Chemotherapy induced cardiomyopathy; and Tobacco abuse on her problem list.      Invasive ductal carcinoma of left breast   09/24/2013 Initial Diagnosis Invasive ductal carcinoma of left breast   11/12/2013 Surgery Left breast lumpectomy by Dr. Arnoldo Sally [name] grade invasive ductal carcinoma less than 0.1 cm from posterior and medial margins, 0/1 lymph nodes, triple negative   12/31/2013 - 04/20/2014  Chemotherapy TAC every 21 days.  Adriamycin dropped following cycle 3 for a decreased EF compared to baseline.     has No Known Allergies.  Sally [name] does not currently have medications on file.  SURGICAL HISTORY: Past Surgical History  Procedure Laterality Date  . Ectopic pregnancy surgery    . Abdominal hysterectomy    . Left leg surgery      17 yrs of age  . Ganglion cyst excision    . Dilation and curettage of uterus    . Colonoscopy  01/09/2012    Procedure: COLONOSCOPY;  Surgeon: Sally Peng, MD;  [facility];  Service: Endoscopy;  Laterality: N/A;  1:30 pm  . Inguinal hernia repair Left   . Partial mastectomy with needle localization and axillary sentinel lymph node bx Left 11/12/2013    Procedure: PARTIAL MASTECTOMY WITH NEEDLE LOCALIZATION AND AXILLARY SENTINEL LYMPH NODE BX;  Surgeon: Sally So, MD;  Location: [facility];  Service: General;  Laterality: Left;  . Portacath placement Right 12/29/2013    Procedure: INSERTION PORT-A-CATH;  Surgeon: Sally So, MD;  Location: [facility];  Service: General;  Laterality: Right;    SOCIAL HISTORY: History   Social History  . Marital Status: Divorced    Spouse Name: N/A    Number of Children: N/A  . Years of Education: N/A   Occupational History  . Not on file.   Social History Main Topics  . Smoking status: Former Smoker -- 0.25 packs/day for 15 years    Types: Cigarettes  . Smokeless tobacco: Never Used  . Alcohol  Use: Yes     Comment: social beer  . Drug Use: No  . Sexual Activity: Yes    Birth Control/ Protection: Surgical   Other Topics Concern  . Not on file   Social History Narrative    FAMILY HISTORY: Family History  Problem Relation Age of Onset  . Diabetes type II Mother   . Diabetes type II Father   . Prostate cancer Father     Review of Systems  Constitutional: Negative.   HENT: Negative.   Eyes: Negative.   Respiratory: Negative.   Cardiovascular: Negative.     Gastrointestinal: Negative for heartburn, nausea, vomiting, abdominal pain, diarrhea, constipation, blood in stool and melena.       WEIGHT GAIN  Genitourinary: Positive for frequency. Negative for dysuria, urgency, hematuria and flank pain.  Musculoskeletal: Positive for back pain.  Skin: Negative.   Neurological: Negative.   Endo/Heme/Allergies: Negative.   Psychiatric/Behavioral: Negative.     PHYSICAL EXAMINATION  ECOG PERFORMANCE STATUS: 1 - Symptomatic but completely ambulatory  Filed Vitals:   01/06/15 1044  BP: 147/91  Pulse: 80  Temp: 98 F (36.7 C)  Resp: 18    Physical Exam  Constitutional: She is oriented to person, place, and time and well-developed, well-nourished, and in no distress.  HENT:  Head: Normocephalic and atraumatic.  Nose: Nose normal.  Mouth/Throat: Oropharynx is clear and moist. No oropharyngeal exudate.  Eyes: Conjunctivae and EOM are normal. Pupils are equal, round, and reactive to light. Right eye exhibits no discharge. Left eye exhibits no discharge. No scleral icterus.  Neck: Normal range of motion. Neck supple. No tracheal deviation present. No thyromegaly present.  Cardiovascular: Normal rate, regular rhythm and normal heart sounds.  Exam reveals no gallop and no friction rub.   No murmur heard. Pulmonary/Chest: Effort normal and breath sounds normal. She has no wheezes. She has no rales.  Breast Exam:  Bilateral breast exam performed R breast WNL, no skin changes, normal nipple no axillary adenopathy. Left breast with scarring and mild deformity  At the approximate 1:00 position. No axillae adeopathy or other concerning changes.  Abdominal: Soft. Bowel sounds are normal. She exhibits no distension and no mass. There is no tenderness. There is no rebound and no guarding.  Musculoskeletal: Normal range of motion. She exhibits no edema.  Lymphadenopathy:    She has no cervical adenopathy.  Neurological: She is alert and oriented to person,  place, and time. She has normal reflexes. No cranial nerve deficit. Gait normal. Coordination normal.  Skin: Skin is warm and dry. No rash noted.  Psychiatric: Mood, memory, affect and judgment normal.  Nursing note and vitals reviewed.   LABORATORY DATA:  CBC    Component Value Date/Time   WBC 4.7 12/22/2014 1148   RBC 4.15 12/22/2014 1148   HGB 12.8 12/22/2014 1148   HCT 37.9 12/22/2014 1148   PLT 171 12/22/2014 1148   MCV 91.3 12/22/2014 1148   MCH 30.8 12/22/2014 1148   MCHC 33.8 12/22/2014 1148   RDW 12.9 12/22/2014 1148   LYMPHSABS 1.2 12/22/2014 1148   MONOABS 0.3 12/22/2014 1148   EOSABS 0.2 12/22/2014 1148   BASOSABS 0.0 12/22/2014 1148   CMP     Component Value Date/Time   NA 140 12/22/2014 1148   K 3.5 12/22/2014 1148   CL 105 12/22/2014 1148   CO2 28 12/22/2014 1148   GLUCOSE 164* 12/22/2014 1148   BUN 18 12/22/2014 1148   CREATININE 1.00 12/22/2014 1148  CALCIUM 9.6 12/22/2014 1148   PROT 7.0 12/22/2014 1148   ALBUMIN 4.3 12/22/2014 1148   AST 27 12/22/2014 1148   ALT 34 12/22/2014 1148   ALKPHOS 138* 12/22/2014 1148   BILITOT 0.5 12/22/2014 1148   GFRNONAA 62* 12/22/2014 1148   GFRAA 72* 12/22/2014 1148     ASSESSMENT and THERAPY PLAN:    Invasive ductal carcinoma of left breast Pleasant 59 year old female with stage I triple negative carcinoma the left breast she has completed all therapy, including lumpectomy and sentinel node biopsy, 6 cycles of adjuvant chemotherapy and adjuvant radiation. She had a decline in her ejection fraction during her chemotherapy, but her heart function has recovered. She is up-to-date on mammography. Exam today was unremarkable. She does complain of new back pain. And I have recommended ordering a bone scan given her tumor histology. I will call her with the results.  She has a lot of questions about getting back into the routine of life. She has questions regarding diet and exercise. I had her meet with Lavella Hammock. We have  given her information on the [facility] program. She would like to participate. I will see her back in 3 months with laboratory studies, sooner if needed.     All questions were answered. The patient knows to call the clinic with any problems, questions or concerns. We can certainly see the patient much sooner if necessary.  Molli Hazard 01/09/2015

## 2015-01-06 NOTE — Patient Instructions (Signed)
[place] at [facility]  Discharge Instructions:  I am going to call you with the results of your bone scan. Please call me in the interim if you have any other problems or concerns We have recommended going to the [facility] program to help answer your questions about diet and exercise. (Finding your new normal) _______________________________________________________________  Thank you for choosing [place] at [facility] to provide your oncology and hematology care.  To afford each patient quality time with our providers, please arrive at least 15 minutes before your scheduled appointment.  You need to re-schedule your appointment if you arrive 10 or more minutes late.  We strive to give you quality time with our providers, and arriving late affects you and other patients whose appointments are after yours.  Also, if you no show three or more times for appointments you may be dismissed from the clinic.  Again, thank you for choosing [place] at [place] hope is that these requests will allow you access to exceptional care and in a timely manner. _______________________________________________________________  If you have questions after your visit, please contact our office at [phone] between the hours of 8:30 a.m. and 5:00 p.m. Voicemails left after 4:30 p.m. will not be returned until the following business day. _______________________________________________________________  For prescription refill requests, have your pharmacy contact our office. _______________________________________________________________  Recommendations made by the consultant and any test results will be sent to your referring physician. _______________________________________________________________

## 2015-01-06 NOTE — Progress Notes (Signed)
[place] Clinical Social Work  Clinical Social Work was referred by Futures trader  for assessment of psychosocial needs due to questions related to transitioning to survivorship.  Clinical Social Worker met with patient at [facility]  to offer support and assess for needs.  CSW explained [facility] class that is coming up in February at [facility] and also [place] at [employer]. CSW discussed common emotions experienced when transitioning to survivorship and ways to help make that transition. Pt left before CSW could confirm date for [facility] and [employer] programs. CSW phoned pt and left info on her vm. Pt aware how to contact CSW as needed.   Clinical Social Work interventions: Resource education  Loren Racer, [place] Tuesdays 8:30-1pm Wednesdays 8:30-12pm  [phone]

## 2015-01-09 NOTE — Assessment & Plan Note (Signed)
Pleasant 59 year old female with stage I triple negative carcinoma the left breast she has completed all therapy, including lumpectomy and sentinel node biopsy, 6 cycles of adjuvant chemotherapy and adjuvant radiation. She had a decline in her ejection fraction during her chemotherapy, but her heart function has recovered. She is up-to-date on mammography. Exam today was unremarkable. She does complain of new back pain. And I have recommended ordering a bone scan given her tumor histology. I will call her with the results.  She has a lot of questions about getting back into the routine of life. She has questions regarding diet and exercise. I had her meet with Lavella Hammock. We have given her information on the [facility] program. She would like to participate. I will see her back in 3 months with laboratory studies, sooner if needed.

## 2015-01-11 ENCOUNTER — Ambulatory Visit (HOSPITAL_COMMUNITY): Payer: 59

## 2015-01-11 ENCOUNTER — Encounter (HOSPITAL_COMMUNITY): Payer: 59

## 2015-01-14 ENCOUNTER — Encounter (HOSPITAL_COMMUNITY): Payer: 59

## 2015-01-22 ENCOUNTER — Encounter (HOSPITAL_COMMUNITY)
Admission: RE | Admit: 2015-01-22 | Discharge: 2015-01-22 | Disposition: A | Discharge status: Home / Self Care | Payer: 59 | Source: Ambulatory Visit | Attending: Hematology & Oncology | Admitting: Hematology & Oncology

## 2015-01-22 ENCOUNTER — Encounter (HOSPITAL_COMMUNITY): Payer: Self-pay

## 2015-01-22 DIAGNOSIS — C50912 Malignant neoplasm of unspecified site of left female breast: Secondary | ICD-10-CM

## 2015-01-22 MED ORDER — TECHNETIUM TC 99M MEDRONATE IV KIT
25.0000 | PACK | Freq: Once | INTRAVENOUS | Status: AC | PRN
  Administered 2015-01-22: 25 via INTRAVENOUS

## 2015-01-26 ENCOUNTER — Other Ambulatory Visit (HOSPITAL_COMMUNITY): Payer: Self-pay

## 2015-01-26 DIAGNOSIS — C50912 Malignant neoplasm of unspecified site of left female breast: Secondary | ICD-10-CM

## 2015-01-28 ENCOUNTER — Ambulatory Visit (HOSPITAL_COMMUNITY)
Admission: RE | Admit: 2015-01-28 | Discharge: 2015-01-28 | Disposition: A | Discharge status: Home / Self Care | Payer: 59 | Source: Ambulatory Visit | Attending: Hematology & Oncology | Admitting: Hematology & Oncology

## 2015-01-28 DIAGNOSIS — C50912 Malignant neoplasm of unspecified site of left female breast: Secondary | ICD-10-CM | POA: Diagnosis not present

## 2015-02-24 ENCOUNTER — Encounter (HOSPITAL_COMMUNITY): Payer: 59 | Attending: Hematology and Oncology

## 2015-02-24 ENCOUNTER — Encounter (HOSPITAL_COMMUNITY): Payer: 59

## 2015-02-24 ENCOUNTER — Encounter (HOSPITAL_COMMUNITY): Payer: Self-pay

## 2015-02-24 VITALS — BP 138/92 | HR 91 | Temp 98.1°F | Resp 18

## 2015-02-24 DIAGNOSIS — Z95828 Presence of other vascular implants and grafts: Secondary | ICD-10-CM

## 2015-02-24 DIAGNOSIS — C50412 Malignant neoplasm of upper-outer quadrant of left female breast: Secondary | ICD-10-CM

## 2015-02-24 DIAGNOSIS — C50912 Malignant neoplasm of unspecified site of left female breast: Secondary | ICD-10-CM | POA: Insufficient documentation

## 2015-02-24 DIAGNOSIS — Z452 Encounter for adjustment and management of vascular access device: Secondary | ICD-10-CM

## 2015-02-24 DIAGNOSIS — Z853 Personal history of malignant neoplasm of breast: Secondary | ICD-10-CM | POA: Insufficient documentation

## 2015-02-24 MED ORDER — HEPARIN SOD (PORK) LOCK FLUSH 100 UNIT/ML IV SOLN
500.0000 [IU] | Freq: Once | INTRAVENOUS | Status: AC
  Administered 2015-02-24: 500 [IU] via INTRAVENOUS
  Filled 2015-02-24: qty 5

## 2015-02-24 MED ORDER — SODIUM CHLORIDE 0.9 % IJ SOLN
10.0000 mL | INTRAMUSCULAR | Status: DC | PRN
  Administered 2015-02-24: 10 mL via INTRAVENOUS
  Filled 2015-02-24: qty 10

## 2015-02-24 NOTE — Progress Notes (Signed)
Sally [name] presented for Portacath access and flush.  Proper placement of portacath confirmed by CXR.  Portacath located right chest wall accessed with  H 20 needle.  Good blood return present. Portacath flushed with 41ml NS and 500U/26ml Heparin and needle removed intact.  Procedure tolerated well and without incident.

## 2015-02-24 NOTE — Patient Instructions (Signed)
Sally [name] at [facility]  Discharge Instructions:  You had your port flushed today.  Follow up as scheduled.  Call the clinic if you have any questions or concerns _______________________________________________________________  Thank you for choosing [place] at [facility] to provide your oncology and hematology care.  To afford each patient quality time with our providers, please arrive at least 15 minutes before your scheduled appointment.  You need to re-schedule your appointment if you arrive 10 or more minutes late.  We strive to give you quality time with our providers, and arriving late affects you and other patients whose appointments are after yours.  Also, if you no show three or more times for appointments you may be dismissed from the clinic.  Again, thank you for choosing [place] at [place] hope is that these requests will allow you access to exceptional care and in a timely manner. _______________________________________________________________  If you have questions after your visit, please contact our office at [phone] between the hours of 8:30 a.m. and 5:00 p.m. Voicemails left after 4:30 p.m. will not be returned until the following business day. _______________________________________________________________  For prescription refill requests, have your pharmacy contact our office. _______________________________________________________________  Recommendations made by the consultant and any test results will be sent to your referring physician. _______________________________________________________________

## 2015-04-07 NOTE — Assessment & Plan Note (Addendum)
Pleasant 59 year old female with stage I triple negative carcinoma the left breast she has completed all therapy, including lumpectomy and sentinel node biopsy, 6 cycles of adjuvant chemotherapy and adjuvant radiation. She had a decline in her ejection fraction during her chemotherapy, but her heart function has recovered. She is up-to-date on mammography.   Work-up for back pain is negative for osseous metastatic disease including a negative femur xray.    She is due for a port flush today and we will update our labs today with a CBC diff, CMET  She is agreeable to keep her port until the end of 2016 or the very beginning of 2017 to complete two years of surveillance post surgery/chemotherapy as her biggest risk of relapse is within the first two years.  She is agreeable to this as the port does not bother her.  Return in 3 months for labs and follow-up visit.

## 2015-04-07 NOTE — Progress Notes (Signed)
[place], MD [address] [place] [address]  Invasive ductal carcinoma of left breast - Plan: CBC with Differential, Comprehensive metabolic panel, heparin lock flush 100 unit/mL, sodium chloride 0.9 % injection 10 mL  CURRENT THERAPY: Surveillance per NCCN guidelines.  INTERVAL HISTORY: Sally [name] 59 y.o. female returns for followup of Stage I triple negative left breast cancer, S/P lumpectomy and SLN biopsy.    Invasive ductal carcinoma of left breast   09/24/2013 Initial Diagnosis Invasive ductal carcinoma of left breast   11/12/2013 Surgery Left breast lumpectomy by Dr. Arnoldo Morale- High grade invasive ductal carcinoma less than 0.1 cm from posterior and medial margins, 0/1 lymph nodes, triple negative   12/31/2013 - 04/20/2014 Chemotherapy TAC every 21 days.  Adriamycin dropped following cycle 3 for a decreased EF compared to baseline.    I personally reviewed and went over laboratory results with the patient.  The results are noted within this dictation.  She complained of some back pain on her last encounter and therefore Dr. Whitney Muse ordered a bone scan to evaluate for osseous disease.  I personally reviewed and went over radiographic studies with the patient.  The results are noted within this dictation and are negative for recurrent disease.  She asked about having her port removed and she is informed that her biggest change of relapse is within the first two years.  Therefore, we like to keep her port for 2 years which will be the end of 2016/beginning of 2017.  She is agreeable to this.  She is learning a new computer system at work and she is having some stress related to that learning curve.  Oncologically, she denies any complaints and ROS questioning is negative.  Past Medical History  Diagnosis Date  . Chronic sinusitis   . Hypertension   . Diabetes mellitus without complication     diet controlled  . Cancer 11/12/2013    left breast  cancer  . 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  Outpatient MUGA scan performed on 02/25/2014 showed a drop in ejection fraction from 72% to 59% and for that reason doxorubicin was discontinued from her chemotherapy regimen starting on cycle 4.   . Hives 05/2014  . Tobacco abuse 10/21/2014    has GANGLION-HAND/WRIST; Invasive ductal carcinoma of left breast; Vasomotor instability; Chemotherapy induced cardiomyopathy; and Tobacco abuse on her problem list.     has No Known Allergies.  We administered heparin lock flush and sodium chloride.  Past Surgical History  Procedure Laterality Date  . Ectopic pregnancy surgery    . Abdominal hysterectomy    . Left leg surgery      17 yrs of age  . Ganglion cyst excision    . Dilation and curettage of uterus    . Colonoscopy  01/09/2012    Procedure: COLONOSCOPY;  Surgeon: Dorothyann Peng, MD;  [facility];  Service: Endoscopy;  Laterality: N/A;  1:30 pm  . Inguinal hernia repair Left   . Partial mastectomy with needle localization and axillary sentinel lymph node bx Left 11/12/2013    Procedure: PARTIAL MASTECTOMY WITH NEEDLE LOCALIZATION AND AXILLARY SENTINEL LYMPH NODE BX;  Surgeon: Jamesetta So, MD;  Location: [facility];  Service: General;  Laterality: Left;  . Portacath placement Right 12/29/2013    Procedure: INSERTION PORT-A-CATH;  Surgeon: Jamesetta So, MD;  Location: [facility];  Service: General;  Laterality: Right;    Denies any headaches, dizziness, double vision, fevers, chills, night sweats, nausea, vomiting, diarrhea, constipation, chest pain, heart palpitations, shortness of breath, blood in stool, black tarry stool, urinary pain, urinary burning, urinary frequency, hematuria.   PHYSICAL EXAMINATION  ECOG PERFORMANCE STATUS: 0 - Asymptomatic  Filed Vitals:   04/08/15 1314  BP: 135/90  Pulse: 93  Temp: 98.5 F (36.9 C)  Resp: 18     GENERAL:alert, no distress, well nourished, well developed, comfortable, cooperative and smiling SKIN: skin color, texture, turgor are normal, no rashes or significant lesions HEAD: Normocephalic, No masses, lesions, tenderness or abnormalities EYES: normal, PERRLA, EOMI, Conjunctiva are pink and non-injected EARS: External ears normal OROPHARYNX:lips, buccal mucosa, and tongue normal and mucous membranes are moist  NECK: supple, no adenopathy, thyroid normal size, non-tender, without nodularity, no stridor, non-tender, trachea midline LYMPH:  no palpable lymphadenopathy, no hepatosplenomegaly BREAST:right breast normal without mass, skin or nipple changes or axillary nodes, left post-lumpectomy site well healed and free of suspicious changes with some hyperpigmented changes from radiation therapy. LUNGS: clear to auscultation and percussion HEART: regular rate & rhythm, no murmurs, no gallops, S1 normal and S2 normal ABDOMEN:abdomen soft, non-tender, obese, normal bowel sounds, no masses or organomegaly and no hepatosplenomegaly BACK: Back symmetric, no curvature., No CVA tenderness EXTREMITIES:less then 2 second capillary refill, no joint deformities, effusion, or inflammation, no edema, no skin discoloration, no clubbing, no cyanosis  NEURO: alert & oriented x 3 with fluent speech, no focal motor/sensory deficits, gait normal   LABORATORY DATA: CBC    Component Value Date/Time   WBC 4.7 12/22/2014 1148   RBC 4.15 12/22/2014 1148   HGB 12.8 12/22/2014 1148   HCT 37.9 12/22/2014 1148   PLT 171 12/22/2014 1148   MCV 91.3 12/22/2014 1148   MCH 30.8 12/22/2014 1148   MCHC 33.8 12/22/2014 1148   RDW 12.9 12/22/2014 1148   LYMPHSABS 1.2 12/22/2014 1148   MONOABS 0.3 12/22/2014 1148   EOSABS 0.2 12/22/2014 1148   BASOSABS 0.0 12/22/2014 1148      Chemistry      Component Value Date/Time   NA 140 12/22/2014 1148   K 3.5 12/22/2014 1148   CL 105 12/22/2014 1148   CO2 28  12/22/2014 1148   BUN 18 12/22/2014 1148   CREATININE 1.00 12/22/2014 1148      Component Value Date/Time   CALCIUM 9.6 12/22/2014 1148   ALKPHOS 138* 12/22/2014 1148   AST 27 12/22/2014 1148   ALT 34 12/22/2014 1148   BILITOT 0.5 12/22/2014 1148        ASSESSMENT AND PLAN:  Invasive ductal carcinoma of left breast Pleasant 59 year old female with stage I triple negative carcinoma the left breast she has completed all therapy, including lumpectomy and sentinel node biopsy, 6 cycles of adjuvant chemotherapy and adjuvant radiation. She had a decline in her ejection fraction during her chemotherapy, but her heart function has recovered. She is up-to-date on mammography.   Work-up for back pain is negative for osseous metastatic disease including a negative femur xray.    She is due for a port flush today and we will update our labs today with a CBC diff, CMET  She is agreeable to keep her port until the end of 2016 or the very beginning of 2017 to complete two years of surveillance post surgery/chemotherapy as her biggest risk of relapse is within the first two years.  She is agreeable to this as the port does not  bother her.  Return in 3 months for labs and follow-up visit.     THERAPY PLAN:  NCCN guidelines recommends the following surveillance for invasive breast cancer:  A. History and Physical exam every 4-6 months for 5 years and then every 12 months.  B. Mammography every 12 months  C. Women on Tamoxifen: annual gynecologic assessment every 12 months if uterus is present.  D. Women on aromatase inhibitor or who experience ovarian failure secondary to treatment should have monitoring of bone health with a bone mineral density determination at baseline and periodically thereafter.  E. Assess and encourage adherence to adjuvant endocrine therapy.  F. Evidence suggests that active lifestyle and achieving and maintaining an ideal body weight (20-25 BMI) may lead to optimal breast  cancer outcomes.   All questions were answered. The patient knows to call the clinic with any problems, questions or concerns. We can certainly see the patient much sooner if necessary.  Patient and plan discussed with Dr. Ancil Linsey and she is in agreement with the aforementioned.   This note is electronically signed by: Robynn Pane 04/08/2015 2:12 PM

## 2015-04-08 ENCOUNTER — Encounter (HOSPITAL_COMMUNITY): Payer: 59 | Attending: Oncology | Admitting: Oncology

## 2015-04-08 ENCOUNTER — Encounter (HOSPITAL_COMMUNITY): Payer: 59 | Attending: Hematology & Oncology

## 2015-04-08 ENCOUNTER — Encounter (HOSPITAL_COMMUNITY): Payer: Self-pay | Admitting: Oncology

## 2015-04-08 VITALS — BP 135/90 | HR 93 | Temp 98.5°F | Resp 18 | Wt 163.0 lb

## 2015-04-08 DIAGNOSIS — Z171 Estrogen receptor negative status [ER-]: Secondary | ICD-10-CM

## 2015-04-08 DIAGNOSIS — C50912 Malignant neoplasm of unspecified site of left female breast: Secondary | ICD-10-CM

## 2015-04-08 LAB — CBC WITH DIFFERENTIAL/PLATELET
BASOS ABS: 0 10*3/uL (ref 0.0–0.1)
Basophils Relative: 1 % (ref 0–1)
EOS PCT: 5 % (ref 0–5)
Eosinophils Absolute: 0.3 10*3/uL (ref 0.0–0.7)
HCT: 39.4 % (ref 36.0–46.0)
HEMOGLOBIN: 13.5 g/dL (ref 12.0–15.0)
LYMPHS ABS: 1.3 10*3/uL (ref 0.7–4.0)
Lymphocytes Relative: 25 % (ref 12–46)
MCH: 31 pg (ref 26.0–34.0)
MCHC: 34.3 g/dL (ref 30.0–36.0)
MCV: 90.6 fL (ref 78.0–100.0)
MONO ABS: 0.6 10*3/uL (ref 0.1–1.0)
MONOS PCT: 11 % (ref 3–12)
NEUTROS ABS: 3.1 10*3/uL (ref 1.7–7.7)
Neutrophils Relative %: 58 % (ref 43–77)
Platelets: 200 10*3/uL (ref 150–400)
RBC: 4.35 MIL/uL (ref 3.87–5.11)
RDW: 13.1 % (ref 11.5–15.5)
WBC: 5.3 10*3/uL (ref 4.0–10.5)

## 2015-04-08 LAB — COMPREHENSIVE METABOLIC PANEL
ALBUMIN: 4.5 g/dL (ref 3.5–5.2)
ALT: 31 U/L (ref 0–35)
ANION GAP: 11 (ref 5–15)
AST: 27 U/L (ref 0–37)
Alkaline Phosphatase: 139 U/L — ABNORMAL HIGH (ref 39–117)
BILIRUBIN TOTAL: 0.7 mg/dL (ref 0.3–1.2)
BUN: 26 mg/dL — AB (ref 6–23)
CALCIUM: 10 mg/dL (ref 8.4–10.5)
CHLORIDE: 102 mmol/L (ref 96–112)
CO2: 26 mmol/L (ref 19–32)
Creatinine, Ser: 1.13 mg/dL — ABNORMAL HIGH (ref 0.50–1.10)
GFR calc Af Amer: 62 mL/min — ABNORMAL LOW (ref >90)
GFR calc non Af Amer: 53 mL/min — ABNORMAL LOW (ref >90)
Glucose, Bld: 144 mg/dL — ABNORMAL HIGH (ref 70–99)
Potassium: 3.4 mmol/L — ABNORMAL LOW (ref 3.5–5.1)
Sodium: 139 mmol/L (ref 135–145)
TOTAL PROTEIN: 7.6 g/dL (ref 6.0–8.3)

## 2015-04-08 MED ORDER — SODIUM CHLORIDE 0.9 % IJ SOLN
10.0000 mL | INTRAMUSCULAR | Status: DC | PRN
  Administered 2015-04-08: 10 mL via INTRAVENOUS
  Filled 2015-04-08: qty 10

## 2015-04-08 MED ORDER — HEPARIN SOD (PORK) LOCK FLUSH 100 UNIT/ML IV SOLN
500.0000 [IU] | Freq: Once | INTRAVENOUS | Status: AC
  Administered 2015-04-08: 500 [IU] via INTRAVENOUS

## 2015-04-08 NOTE — Patient Instructions (Signed)
..  [place] at [facility] Discharge Instructions  RECOMMENDATIONS MADE BY THE CONSULTANT AND ANY TEST RESULTS WILL BE SENT TO YOUR REFERRING PHYSICIAN.  Lets plan on keeping port in for another 9 months  Flush port every 6-8 weeks Labs today and return in 3 months  Thank you for choosing [place] at [facility] to provide your oncology and hematology care.  To afford each patient quality time with our provider, please arrive at least 15 minutes before your scheduled appointment time.    You need to re-schedule your appointment should you arrive 10 or more minutes late.  We strive to give you quality time with our providers, and arriving late affects you and other patients whose appointments are after yours.  Also, if you no show three or more times for appointments you may be dismissed from the clinic at the providers discretion.     Again, thank you for choosing [facility].  Our hope is that these requests will decrease the amount of time that you wait before being seen by our physicians.       _____________________________________________________________  Should you have questions after your visit to [facility], please contact our office at [phone] between the hours of 8:30 a.m. and 4:30 p.m.  Voicemails left after 4:30 p.m. will not be returned until the following business day.  For prescription refill requests, have your pharmacy contact our office.

## 2015-04-08 NOTE — Progress Notes (Signed)
1350:  Sally [name] presented for Portacath access and flush.  Portacath located right chest wall accessed with  H 20 needle.  No blood return noted - flushes w/o difficulty; denies pain at site.  No s/s infiltration. Portacath flushed with 54ml NS and 500U/27ml Heparin and needle removed intact.  Procedure tolerated well and without incident.    1355:  Sally [name] presented for labwork. Labs per MD order drawn via Peripheral Line 23 gauge needle inserted in right AC.  Good blood return present. Procedure without incident.  Needle removed intact. Patient tolerated procedure well.  See today's OV encounter for assessment and further details.

## 2015-04-14 ENCOUNTER — Encounter (HOSPITAL_COMMUNITY): Payer: 59

## 2015-06-02 ENCOUNTER — Encounter (HOSPITAL_COMMUNITY): Payer: 59

## 2015-06-03 ENCOUNTER — Encounter (HOSPITAL_COMMUNITY): Payer: 59 | Attending: Hematology and Oncology

## 2015-06-03 ENCOUNTER — Encounter (HOSPITAL_COMMUNITY): Payer: Self-pay

## 2015-06-03 DIAGNOSIS — C50912 Malignant neoplasm of unspecified site of left female breast: Secondary | ICD-10-CM | POA: Diagnosis not present

## 2015-06-03 DIAGNOSIS — Z452 Encounter for adjustment and management of vascular access device: Secondary | ICD-10-CM

## 2015-06-03 DIAGNOSIS — Z853 Personal history of malignant neoplasm of breast: Secondary | ICD-10-CM | POA: Insufficient documentation

## 2015-06-03 MED ORDER — HEPARIN SOD (PORK) LOCK FLUSH 100 UNIT/ML IV SOLN
500.0000 [IU] | Freq: Once | INTRAVENOUS | Status: AC
  Administered 2015-06-03: 500 [IU] via INTRAVENOUS

## 2015-06-03 MED ORDER — HEPARIN SOD (PORK) LOCK FLUSH 100 UNIT/ML IV SOLN
INTRAVENOUS | Status: AC
  Filled 2015-06-03: qty 5

## 2015-06-03 MED ORDER — SODIUM CHLORIDE 0.9 % IJ SOLN
10.0000 mL | INTRAMUSCULAR | Status: DC | PRN
  Administered 2015-06-03: 80 mL via INTRAVENOUS
  Filled 2015-06-03: qty 10

## 2015-06-03 NOTE — Patient Instructions (Signed)
[place] at [facility] Discharge Instructions  RECOMMENDATIONS MADE BY THE CONSULTANT AND ANY TEST RESULTS WILL BE SENT TO YOUR REFERRING PHYSICIAN.  You got your port flushed today. Will see you in July for your Doctor appt and bloodwork.  Thank you for choosing [name] at [facility] to provide your oncology and hematology care.  To afford each patient quality time with our provider, please arrive at least 15 minutes before your scheduled appointment time.    You need to re-schedule your appointment should you arrive 10 or more minutes late.  We strive to give you quality time with our providers, and arriving late affects you and other patients whose appointments are after yours.  Also, if you no show three or more times for appointments you may be dismissed from the clinic at the providers discretion.     Again, thank you for choosing [facility].  Our hope is that these requests will decrease the amount of time that you wait before being seen by our physicians.       _____________________________________________________________  Should you have questions after your visit to [facility], please contact our office at [phone] between the hours of 8:30 a.m. and 4:30 p.m.  Voicemails left after 4:30 p.m. will not be returned until the following business day.  For prescription refill requests, have your pharmacy contact our office.

## 2015-06-03 NOTE — Progress Notes (Signed)
Sally [name] presented for Portacath access and flush. Portacath located right chest wall accessed with  H 20 needle. No blood return Portacath flushed with 52ml NS and 500U/54ml Heparin and needle removed intact. Procedure without incident. Patient tolerated procedure well.

## 2015-07-08 ENCOUNTER — Encounter (HOSPITAL_BASED_OUTPATIENT_CLINIC_OR_DEPARTMENT_OTHER): Payer: 59

## 2015-07-08 ENCOUNTER — Other Ambulatory Visit (HOSPITAL_COMMUNITY): Payer: Self-pay | Admitting: Hematology & Oncology

## 2015-07-08 ENCOUNTER — Other Ambulatory Visit (HOSPITAL_COMMUNITY): Payer: Self-pay | Admitting: Oncology

## 2015-07-08 ENCOUNTER — Encounter (HOSPITAL_COMMUNITY): Payer: 59 | Attending: Hematology and Oncology | Admitting: Hematology & Oncology

## 2015-07-08 ENCOUNTER — Encounter (HOSPITAL_COMMUNITY): Payer: Self-pay | Admitting: Hematology & Oncology

## 2015-07-08 VITALS — BP 128/95 | Temp 97.6°F | Resp 18 | Wt 160.1 lb

## 2015-07-08 DIAGNOSIS — Z853 Personal history of malignant neoplasm of breast: Secondary | ICD-10-CM

## 2015-07-08 DIAGNOSIS — C50412 Malignant neoplasm of upper-outer quadrant of left female breast: Secondary | ICD-10-CM

## 2015-07-08 DIAGNOSIS — E876 Hypokalemia: Secondary | ICD-10-CM

## 2015-07-08 DIAGNOSIS — C50912 Malignant neoplasm of unspecified site of left female breast: Secondary | ICD-10-CM | POA: Insufficient documentation

## 2015-07-08 LAB — COMPREHENSIVE METABOLIC PANEL
ALBUMIN: 4.6 g/dL (ref 3.5–5.0)
ALT: 30 U/L (ref 14–54)
ANION GAP: 10 (ref 5–15)
AST: 22 U/L (ref 15–41)
Alkaline Phosphatase: 133 U/L — ABNORMAL HIGH (ref 38–126)
BUN: 20 mg/dL (ref 6–20)
CO2: 28 mmol/L (ref 22–32)
Calcium: 9.6 mg/dL (ref 8.9–10.3)
Chloride: 99 mmol/L — ABNORMAL LOW (ref 101–111)
Creatinine, Ser: 1.03 mg/dL — ABNORMAL HIGH (ref 0.44–1.00)
GFR calc Af Amer: >60 mL/min (ref >60)
GFR calc non Af Amer: 60 mL/min — ABNORMAL LOW (ref >60)
GLUCOSE: 133 mg/dL — AB (ref 65–99)
Potassium: 3.3 mmol/L — ABNORMAL LOW (ref 3.5–5.1)
Sodium: 137 mmol/L (ref 135–145)
Total Bilirubin: 0.7 mg/dL (ref 0.3–1.2)
Total Protein: 7.7 g/dL (ref 6.5–8.1)

## 2015-07-08 LAB — CBC WITH DIFFERENTIAL/PLATELET
Basophils Absolute: 0 10*3/uL (ref 0.0–0.1)
Basophils Relative: 1 % (ref 0–1)
EOS ABS: 0.2 10*3/uL (ref 0.0–0.7)
Eosinophils Relative: 4 % (ref 0–5)
HEMATOCRIT: 39 % (ref 36.0–46.0)
Hemoglobin: 13.3 g/dL (ref 12.0–15.0)
LYMPHS ABS: 2 10*3/uL (ref 0.7–4.0)
LYMPHS PCT: 33 % (ref 12–46)
MCH: 30.4 pg (ref 26.0–34.0)
MCHC: 34.1 g/dL (ref 30.0–36.0)
MCV: 89 fL (ref 78.0–100.0)
MONO ABS: 0.4 10*3/uL (ref 0.1–1.0)
Monocytes Relative: 7 % (ref 3–12)
Neutro Abs: 3.4 10*3/uL (ref 1.7–7.7)
Neutrophils Relative %: 55 % (ref 43–77)
PLATELETS: 193 10*3/uL (ref 150–400)
RBC: 4.38 MIL/uL (ref 3.87–5.11)
RDW: 12.8 % (ref 11.5–15.5)
WBC: 6.2 10*3/uL (ref 4.0–10.5)

## 2015-07-08 MED ORDER — POTASSIUM CHLORIDE CRYS ER 20 MEQ PO TBCR: 20.0000 meq | EXTENDED_RELEASE_TABLET | Freq: Two times a day (BID) | ORAL | Status: DC

## 2015-07-08 MED ORDER — SODIUM CHLORIDE 0.9 % IJ SOLN
10.0000 mL | INTRAMUSCULAR | Status: DC | PRN
  Administered 2015-07-08: 10 mL via INTRAVENOUS
  Filled 2015-07-08: qty 10

## 2015-07-08 MED ORDER — HEPARIN SOD (PORK) LOCK FLUSH 100 UNIT/ML IV SOLN
500.0000 [IU] | Freq: Once | INTRAVENOUS | Status: AC
  Administered 2015-07-08: 500 [IU] via INTRAVENOUS
  Filled 2015-07-08: qty 5

## 2015-07-08 NOTE — Progress Notes (Signed)
[name], MD [address] [place] [address]   Stage I triple-negative left breast cancer, status post lumpectomy and sentinel node biopsy  Completed 6 cycles of adjuvant chemotherapy, 3 cycles with TAC and 3 cycles with TC do to document a drop in ejection fraction which normalized at the conclusion of therapy. Radiotherapy was started on 05/12/2014 and completed on 07/03/2014.   Mammogram 10/2014  CURRENT THERAPY: Observation  INTERVAL HISTORY: Sally [name] 59 y.o. female returns for follow-up of her triple negative breast cancer. She gets occasional breast soreness. She is having trouble transitioning to normal life again in regards to diet, exercise and general activity. She has not thought about support groups.   The patient is present today in a depressed state along with ongoing complaints of left breast discomfort.  She describes this discomfort as underneath her breast being heavy, irritating, and pushing "into" her.  She says that she has examined herselff but has not felt anything.  She admits to gaining weight and thinks this may be a part of the cause.  She also questions if maybe her bras are too tight and says that she sometimes will wear sports bras .  She says that the discomfort began this past April and is a little bit better than initially.  The patient is depressed from overwhelming changes at her workplace along with complications she is having with both of her sons, one who has moved in with her.  She says that she does not cry but she does have some anxiety. MEDICAL HISTORY: Past Medical History  Diagnosis Date  . Chronic sinusitis   . Hypertension   . Diabetes mellitus without complication     diet controlled  . Cancer 11/12/2013    left breast cancer  . 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  Outpatient MUGA scan performed on 02/25/2014 showed a drop in  ejection fraction from 72% to 59% and for that reason doxorubicin was discontinued from her chemotherapy regimen starting on cycle 4.   . Hives 05/2014  . Tobacco abuse 10/21/2014    has GANGLION-HAND/WRIST; Invasive ductal carcinoma of left breast; Vasomotor instability; Chemotherapy induced cardiomyopathy; and Tobacco abuse on her problem list.      Invasive ductal carcinoma of left breast   09/24/2013 Initial Diagnosis Invasive ductal carcinoma of left breast   11/12/2013 Surgery Left breast lumpectomy by Dr. Arnoldo [name]- High grade invasive ductal carcinoma less than 0.1 cm from posterior and medial margins, 0/1 lymph nodes, triple negative   12/31/2013 - 04/20/2014 Chemotherapy TAC every 21 days.  Adriamycin dropped following cycle 3 for a decreased EF compared to baseline.     has No Known Allergies.  Sally [name] had no medications administered during this visit.  SURGICAL HISTORY: Past Surgical History  Procedure Laterality Date  . Ectopic pregnancy surgery    . Abdominal hysterectomy    . Left leg surgery      17 yrs of age  . Ganglion cyst excision    . Dilation and curettage of uterus    . Colonoscopy  01/09/2012    Procedure: COLONOSCOPY;  Surgeon: Sally Peng, MD;  Location: [facility];  Service: Endoscopy;  Laterality: N/A;  1:30 pm  . Inguinal hernia repair Left   . Partial mastectomy with needle localization and axillary sentinel lymph node bx Left 11/12/2013    Procedure: PARTIAL MASTECTOMY WITH NEEDLE LOCALIZATION AND  AXILLARY SENTINEL LYMPH NODE BX;  Surgeon: Sally So, MD;  Location: [facility] ORS;  Service: General;  Laterality: Left;  . Portacath placement Right 12/29/2013    Procedure: INSERTION PORT-A-CATH;  Surgeon: Sally So, MD;  Location: [facility] ORS;  Service: General;  Laterality: Right;    SOCIAL HISTORY: History   Social History  . Marital Status: Divorced    Spouse Name: N/A  . Number of Children: N/A  . Years of Education: N/A   Occupational  History  . Not on file.   Social History Main Topics  . Smoking status: Former Smoker -- 0.25 packs/day for 15 years    Types: Cigarettes  . Smokeless tobacco: Never Used  . Alcohol Use: Yes     Comment: social beer  . Drug Use: No  . Sexual Activity: Yes    Birth Control/ Protection: Surgical   Other Topics Concern  . Not on file   Social History Narrative    FAMILY HISTORY: Family History  Problem Relation Age of Onset  . Diabetes type II Mother   . Diabetes type II Father   . Prostate cancer Father     Review of Systems  Constitutional: Negative.   HENT: Negative.   Eyes: Negative.   Respiratory: Negative.   Cardiovascular: Negative.   Gastrointestinal: Negative for heartburn, nausea, vomiting, abdominal pain, diarrhea, constipation, blood in stool and melena.       WEIGHT GAIN  Genitourinary: Positive for frequency. Negative for dysuria, urgency, hematuria and flank pain.  Musculoskeletal: Positive for back pain.  Skin: Negative.   Neurological: Negative.   Endo/Heme/Allergies: Negative.   Psychiatric/Behavioral: Negative.   14 point review of systems was performed and is negative except as detailed under history of present illness and above  PHYSICAL EXAMINATION  ECOG PERFORMANCE STATUS: 1 - Symptomatic but completely ambulatory  Filed Vitals:   07/08/15 1137  BP: 128/95  Temp: 97.6 F (36.4 C)  Resp: 18    Physical Exam  Constitutional: She is oriented to person, place, and time and well-developed, well-nourished, and in no distress.  HENT:  Head: Normocephalic and atraumatic.  Nose: Nose normal.  Mouth/Throat: Oropharynx is clear and moist. No oropharyngeal exudate.  Eyes: Conjunctivae and EOM are normal. Pupils are equal, round, and reactive to light. Right eye exhibits no discharge. Left eye exhibits no discharge. No scleral icterus.  Neck: Normal range of motion. Neck supple. No tracheal deviation present. No thyromegaly present.    Cardiovascular: Normal rate, regular rhythm and normal heart sounds.  Exam reveals no gallop and no friction rub.   No murmur heard. Pulmonary/Chest: Effort normal and breath sounds normal. She has no wheezes. She has no rales.  L breast 2-3:00 position, 5 cm from nipple, scar and indentation  Abdominal: Soft. Bowel sounds are normal. She exhibits no distension and no mass. There is no tenderness. There is no rebound and no guarding.  Musculoskeletal: Normal range of motion. She exhibits no edema.  Lymphadenopathy:    She has no cervical adenopathy.  Neurological: She is alert and oriented to person, place, and time. She has normal reflexes. No cranial nerve deficit. Gait normal. Coordination normal.  Skin: Skin is warm and dry. No rash noted.  Psychiatric: Mood, memory, affect and judgment normal.  Nursing note and vitals reviewed.   LABORATORY DATA:  CBC    Component Value Date/Time   WBC 6.2 07/08/2015 0944   RBC 4.38 07/08/2015 0944   HGB 13.3 07/08/2015 0944  HCT 39.0 07/08/2015 0944   PLT 193 07/08/2015 0944   MCV 89.0 07/08/2015 0944   MCH 30.4 07/08/2015 0944   MCHC 34.1 07/08/2015 0944   RDW 12.8 07/08/2015 0944   LYMPHSABS 2.0 07/08/2015 0944   MONOABS 0.4 07/08/2015 0944   EOSABS 0.2 07/08/2015 0944   BASOSABS 0.0 07/08/2015 0944   CMP     Component Value Date/Time   NA 137 07/08/2015 0944   K 3.3* 07/08/2015 0944   CL 99* 07/08/2015 0944   CO2 28 07/08/2015 0944   GLUCOSE 133* 07/08/2015 0944   BUN 20 07/08/2015 0944   CREATININE 1.03* 07/08/2015 0944   CALCIUM 9.6 07/08/2015 0944   PROT 7.7 07/08/2015 0944   ALBUMIN 4.6 07/08/2015 0944   AST 22 07/08/2015 0944   ALT 30 07/08/2015 0944   ALKPHOS 133* 07/08/2015 0944   BILITOT 0.7 07/08/2015 0944   GFRNONAA 60* 07/08/2015 0944   GFRAA >60 07/08/2015 0944     ASSESSMENT and THERAPY PLAN:  Stage I triple-negative left breast cancer, status post lumpectomy and sentinel node biopsy Left breast  discomfort Anxiety/depression  I did a breast exam of the left breast and again reassured the patient that I do not feel any suspicious abnormality. You will be due for mammogram again in November. I advised her that if her breast symptoms worsen to call us and we can try to move her mammogram up sooner.  We did discuss options for her anxiety and depression. She has been on Effexor in the past for hot flashes but is not taking it and I discussed with her perhaps restarting it as it is a good antidepressant. I believe it will help her cope better with her current social problems.She will think on this.  Orders Placed This Encounter  Procedures  . MM Digital Screening    Standing Status: Future     Number of Occurrences:      Standing Expiration Date: 07/07/2016    Order Specific Question:  Reason for Exam (SYMPTOM  OR DIAGNOSIS REQUIRED)    Answer:  breast cancer history, screening    Order Specific Question:  Is the patient pregnant?    Answer:  No    Order Specific Question:  Preferred imaging location?    Answer:  [facility]  . CBC with Differential    Standing Status: Future     Number of Occurrences:      Standing Expiration Date: 07/07/2016  . Comprehensive metabolic panel    Standing Status: Future     Number of Occurrences:      Standing Expiration Date: 07/07/2016   I will plan on seeing her back again in 3 months. I again encouraged her to call and come in sooner if needed prior.  All questions were answered. The patient knows to call the clinic with any problems, questions or concerns. We can certainly see the patient much sooner if necessary.   This document serves as a record of services personally performed by Ancil Linsey, MD. It was created on her behalf by Janace Hoard, a trained medical scribe. The creation of this record is based on the scribe's personal observations and the provider's statements to them. This document has been checked and approved by  the attending provider.  I have reviewed the above documentation for accuracy and completeness, and I agree with the above.  This note was electronically signed.  Kelby Fam. Whitney Muse, MD

## 2015-07-08 NOTE — Progress Notes (Signed)
..  Sally [name] presented for Portacath access and flush.    Portacath located rt chest wall accessed with  H 20 needle.  Good blood return present. Portacath flushed with 35ml NS and 500U/37ml Heparin and needle removed intact.  Procedure tolerated well and without incident.   See MD encounter for assessment

## 2015-07-08 NOTE — Patient Instructions (Signed)
[place] at [facility] Discharge Instructions  RECOMMENDATIONS MADE BY THE CONSULTANT AND ANY TEST RESULTS WILL BE SENT TO YOUR REFERRING PHYSICIAN.  Exam and discussion by Dr. Whitney Muse. Will get you scheduled for mammogram in November. Will put you on something to help with your depression.  Report any new lumps, bone pain, shortness of breath or other symptoms.  Port flushes every 6 - 8 weeks. Labs and office visit in 3 months.  Thank you for choosing [place] at [facility] to provide your oncology and hematology care.  To afford each patient quality time with our provider, please arrive at least 15 minutes before your scheduled appointment time.    You need to re-schedule your appointment should you arrive 10 or more minutes late.  We strive to give you quality time with our providers, and arriving late affects you and other patients whose appointments are after yours.  Also, if you no show three or more times for appointments you may be dismissed from the clinic at the providers discretion.     Again, thank you for choosing [facility].  Our hope is that these requests will decrease the amount of time that you wait before being seen by our physicians.       _____________________________________________________________  Should you have questions after your visit to [facility], please contact our office at [phone] between the hours of 8:30 a.m. and 4:30 p.m.  Voicemails left after 4:30 p.m. will not be returned until the following business day.  For prescription refill requests, have your pharmacy contact our office.

## 2015-09-02 ENCOUNTER — Encounter (HOSPITAL_COMMUNITY): Payer: 59

## 2015-09-06 ENCOUNTER — Encounter (HOSPITAL_COMMUNITY): Payer: Self-pay

## 2015-09-06 ENCOUNTER — Encounter (HOSPITAL_COMMUNITY): Payer: 59 | Attending: Hematology and Oncology

## 2015-09-06 DIAGNOSIS — C50912 Malignant neoplasm of unspecified site of left female breast: Secondary | ICD-10-CM | POA: Insufficient documentation

## 2015-09-06 DIAGNOSIS — C50412 Malignant neoplasm of upper-outer quadrant of left female breast: Secondary | ICD-10-CM | POA: Diagnosis not present

## 2015-09-06 DIAGNOSIS — Z853 Personal history of malignant neoplasm of breast: Secondary | ICD-10-CM | POA: Insufficient documentation

## 2015-09-06 DIAGNOSIS — Z452 Encounter for adjustment and management of vascular access device: Secondary | ICD-10-CM | POA: Diagnosis not present

## 2015-09-06 MED ORDER — HEPARIN SOD (PORK) LOCK FLUSH 100 UNIT/ML IV SOLN
500.0000 [IU] | Freq: Once | INTRAVENOUS | Status: AC
  Administered 2015-09-06: 500 [IU] via INTRAVENOUS

## 2015-09-06 MED ORDER — SODIUM CHLORIDE 0.9 % IJ SOLN: 10.0000 mL | INTRAMUSCULAR | Status: DC | PRN

## 2015-09-06 MED ORDER — HEPARIN SOD (PORK) LOCK FLUSH 100 UNIT/ML IV SOLN: 500.0000 [IU] | Freq: Once | INTRAVENOUS | Status: DC

## 2015-09-06 MED ORDER — HEPARIN SOD (PORK) LOCK FLUSH 100 UNIT/ML IV SOLN
INTRAVENOUS | Status: AC
  Filled 2015-09-06: qty 5

## 2015-09-06 MED ORDER — SODIUM CHLORIDE 0.9 % IJ SOLN
10.0000 mL | INTRAMUSCULAR | Status: DC | PRN
  Administered 2015-09-06: 10 mL via INTRAVENOUS
  Filled 2015-09-06: qty 10

## 2015-09-06 NOTE — Progress Notes (Signed)
Sally [name] presented for Portacath access and flush.  Portacath located right chest wall accessed with  H 20 needle.  Good blood return present. Portacath flushed with 26ml NS and 500U/16ml Heparin and needle removed intact.  Procedure tolerated well and without incident.

## 2015-09-06 NOTE — Patient Instructions (Signed)
[facility] at [facility] Discharge Instructions  RECOMMENDATIONS MADE BY THE CONSULTANT AND ANY TEST RESULTS WILL BE SENT TO YOUR REFERRING PHYSICIAN.  Port flush today. Return as scheduled for lab work and office visit.   Thank you for choosing  [facility] to provide your oncology and hematology care.  To afford each patient quality time with our provider, please arrive at least 15 minutes before your scheduled appointment time.    You need to re-schedule your appointment should you arrive 10 or more minutes late.  We strive to give you quality time with our providers, and arriving late affects you and other patients whose appointments are after yours.  Also, if you no show three or more times for appointments you may be dismissed from the clinic at the providers discretion.     Again, thank you for choosing [facility].  Our hope is that these requests will decrease the amount of time that you wait before being seen by our physicians.       _____________________________________________________________  Should you have questions after your visit to [facility], please contact our office at [phone] between the hours of 8:30 a.m. and 4:30 p.m.  Voicemails left after 4:30 p.m. will not be returned until the following business day.  For prescription refill requests, have your pharmacy contact our office.    

## 2015-09-30 ENCOUNTER — Encounter (HOSPITAL_COMMUNITY): Payer: 59

## 2015-09-30 ENCOUNTER — Ambulatory Visit (HOSPITAL_COMMUNITY): Payer: 59 | Admitting: Hematology & Oncology

## 2015-10-07 ENCOUNTER — Encounter (HOSPITAL_COMMUNITY): Payer: Self-pay | Admitting: Hematology & Oncology

## 2015-10-07 ENCOUNTER — Encounter (HOSPITAL_COMMUNITY): Payer: 59 | Attending: Hematology and Oncology | Admitting: Hematology & Oncology

## 2015-10-07 ENCOUNTER — Encounter (HOSPITAL_COMMUNITY): Payer: 59

## 2015-10-07 DIAGNOSIS — C50412 Malignant neoplasm of upper-outer quadrant of left female breast: Secondary | ICD-10-CM | POA: Diagnosis not present

## 2015-10-07 DIAGNOSIS — Z853 Personal history of malignant neoplasm of breast: Secondary | ICD-10-CM | POA: Insufficient documentation

## 2015-10-07 DIAGNOSIS — M25541 Pain in joints of right hand: Secondary | ICD-10-CM | POA: Diagnosis not present

## 2015-10-07 DIAGNOSIS — C50912 Malignant neoplasm of unspecified site of left female breast: Secondary | ICD-10-CM

## 2015-10-07 DIAGNOSIS — Z171 Estrogen receptor negative status [ER-]: Secondary | ICD-10-CM

## 2015-10-07 DIAGNOSIS — N644 Mastodynia: Secondary | ICD-10-CM | POA: Diagnosis not present

## 2015-10-07 DIAGNOSIS — F418 Other specified anxiety disorders: Secondary | ICD-10-CM

## 2015-10-07 LAB — COMPREHENSIVE METABOLIC PANEL
ALT: 27 U/L (ref 14–54)
AST: 26 U/L (ref 15–41)
Albumin: 4.4 g/dL (ref 3.5–5.0)
Alkaline Phosphatase: 138 U/L — ABNORMAL HIGH (ref 38–126)
Anion gap: 7 (ref 5–15)
BILIRUBIN TOTAL: 0.9 mg/dL (ref 0.3–1.2)
BUN: 19 mg/dL (ref 6–20)
CO2: 27 mmol/L (ref 22–32)
Calcium: 9.6 mg/dL (ref 8.9–10.3)
Chloride: 105 mmol/L (ref 101–111)
Creatinine, Ser: 1.16 mg/dL — ABNORMAL HIGH (ref 0.44–1.00)
GFR calc Af Amer: 60 mL/min — ABNORMAL LOW (ref >60)
GFR, EST NON AFRICAN AMERICAN: 52 mL/min — AB (ref >60)
Glucose, Bld: 128 mg/dL — ABNORMAL HIGH (ref 65–99)
POTASSIUM: 3.5 mmol/L (ref 3.5–5.1)
Sodium: 139 mmol/L (ref 135–145)
TOTAL PROTEIN: 7.4 g/dL (ref 6.5–8.1)

## 2015-10-07 LAB — CBC WITH DIFFERENTIAL/PLATELET
BASOS ABS: 0 10*3/uL (ref 0.0–0.1)
Basophils Relative: 1 %
Eosinophils Absolute: 0.2 10*3/uL (ref 0.0–0.7)
Eosinophils Relative: 5 %
HEMATOCRIT: 40.4 % (ref 36.0–46.0)
Hemoglobin: 14 g/dL (ref 12.0–15.0)
LYMPHS ABS: 1.4 10*3/uL (ref 0.7–4.0)
LYMPHS PCT: 29 %
MCH: 31 pg (ref 26.0–34.0)
MCHC: 34.7 g/dL (ref 30.0–36.0)
MCV: 89.6 fL (ref 78.0–100.0)
MONO ABS: 0.4 10*3/uL (ref 0.1–1.0)
MONOS PCT: 7 %
NEUTROS ABS: 2.9 10*3/uL (ref 1.7–7.7)
Neutrophils Relative %: 58 %
Platelets: 183 10*3/uL (ref 150–400)
RBC: 4.51 MIL/uL (ref 3.87–5.11)
RDW: 12.9 % (ref 11.5–15.5)
WBC: 5 10*3/uL (ref 4.0–10.5)

## 2015-10-07 MED ORDER — HEPARIN SOD (PORK) LOCK FLUSH 100 UNIT/ML IV SOLN
500.0000 [IU] | Freq: Once | INTRAVENOUS | Status: AC
  Administered 2015-10-07: 500 [IU] via INTRAVENOUS
  Filled 2015-10-07: qty 5

## 2015-10-07 MED ORDER — ESCITALOPRAM OXALATE 20 MG PO TABS: ORAL_TABLET | ORAL | Status: DC

## 2015-10-07 MED ORDER — SODIUM CHLORIDE 0.9 % IJ SOLN
10.0000 mL | INTRAMUSCULAR | Status: DC | PRN
  Administered 2015-10-07: 10 mL via INTRAVENOUS
  Filled 2015-10-07: qty 10

## 2015-10-07 NOTE — Patient Instructions (Signed)
..  [name] at [facility] Discharge Instructions  RECOMMENDATIONS MADE BY THE CONSULTANT AND ANY TEST RESULTS WILL BE SENT TO YOUR REFERRING PHYSICIAN.  Labs in 4 months Port flush every 8 weeks Prescription given for lexapro    Thank you for choosing [place] at [facility] to provide your oncology and hematology care.  To afford each patient quality time with our provider, please arrive at least 15 minutes before your scheduled appointment time.    You need to re-schedule your appointment should you arrive 10 or more minutes late.  We strive to give you quality time with our providers, and arriving late affects you and other patients whose appointments are after yours.  Also, if you no show three or more times for appointments you may be dismissed from the clinic at the providers discretion.     Again, thank you for choosing [facility].  Our hope is that these requests will decrease the amount of time that you wait before being seen by our physicians.       _____________________________________________________________  Should you have questions after your visit to [facility], please contact our office at [phone] between the hours of 8:30 a.m. and 4:30 p.m.  Voicemails left after 4:30 p.m. will not be returned until the following business day.  For prescription refill requests, have your pharmacy contact our office.

## 2015-10-07 NOTE — Progress Notes (Signed)
See MD exam notes 

## 2015-10-07 NOTE — Progress Notes (Signed)
See 10/07/15 office visit encounter.  

## 2015-10-07 NOTE — Progress Notes (Signed)
[place], MD [address] [place] [address]   Stage I triple-negative left breast cancer, status post lumpectomy and sentinel node biopsy  Completed 6 cycles of adjuvant chemotherapy, 3 cycles with TAC and 3 cycles with TC do to document a drop in ejection fraction which normalized at the conclusion of therapy. Radiotherapy was started on 05/12/2014 and completed on 07/03/2014.   Mammogram 10/2014  CURRENT THERAPY: Observation  INTERVAL HISTORY: Sally [name] 59 y.o. female returns for follow-up of her triple negative breast cancer. She gets occasional breast soreness. She is having trouble transitioning to normal life again in regards to diet, exercise and general activity, although she notes today she is doing better.  The patient states she is doing well.  Her son has moved out of her home and has a job, is now on his own.  This is a relief to her.  She still has a lot of anxiety when it comes to her follow up appointments.  She expresses that she is unhappy with her workplace.  She feels as if she is not treated correctly and there is a clear lack of communication.   She complains of joint pain in her R thumb that began after her last office visit. She thinks it is from how she uses her ipad.  She is due for a mammogram next month.  Oncologically, she has no new complaints.   MEDICAL HISTORY: Past Medical History  Diagnosis Date  . Chronic sinusitis   . Hypertension   . Diabetes mellitus without complication (HCC)     diet controlled  . Cancer ([facility]) 11/12/2013    left breast cancer  . 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  Outpatient MUGA scan performed on 02/25/2014 showed a drop in ejection fraction from 72% to 59% and for that reason doxorubicin was discontinued from her chemotherapy regimen starting on cycle 4.   . Hives 05/2014  . Tobacco abuse  10/21/2014    has GANGLION-HAND/WRIST; Invasive ductal carcinoma of left breast ([facility]); Vasomotor instability; Chemotherapy induced cardiomyopathy ([facility]); and Tobacco abuse on her problem list.      Invasive ductal carcinoma of left breast ([facility])   09/24/2013 Initial Diagnosis Invasive ductal carcinoma of left breast   11/12/2013 Surgery Left breast lumpectomy by Dr. Arnoldo Morale- High grade invasive ductal carcinoma less than 0.1 cm from posterior and medial margins, 0/1 lymph nodes, triple negative   12/31/2013 - 04/20/2014 Chemotherapy TAC every 21 days.  Adriamycin dropped following cycle 3 for a decreased EF compared to baseline.     has No Known Allergies.  We administered heparin lock flush and sodium chloride.  SURGICAL HISTORY: Past Surgical History  Procedure Laterality Date  . Ectopic pregnancy surgery    . Abdominal hysterectomy    . Left leg surgery      17 yrs of age  . Ganglion cyst excision    . Dilation and curettage of uterus    . Colonoscopy  01/09/2012    Procedure: COLONOSCOPY;  Surgeon: Dorothyann Peng, MD;  Location[facility];  Service: Endoscopy;  Laterality: N/A;  1:30 pm  . Inguinal hernia repair Left   . Partial mastectomy with needle localization and axillary sentinel lymph node bx Left 11/12/2013    Procedure: PARTIAL MASTECTOMY WITH NEEDLE LOCALIZATION AND AXILLARY SENTINEL LYMPH NODE BX;  Surgeon: Jamesetta So, MD;  Location: [facility];  Service: General;  Laterality: Left;  . Portacath placement Right 12/29/2013    Procedure: INSERTION PORT-A-CATH;  Surgeon: Jamesetta So, MD;  Location: [facility];  Service: General;  Laterality: Right;    SOCIAL HISTORY: Social History   Social History  . Marital Status: Divorced    Spouse Name: N/A  . Number of Children: N/A  . Years of Education: N/A   Occupational History  . Not on file.   Social History Main Topics  . Smoking status: Former Smoker -- 0.25 packs/day for 15 years    Types: Cigarettes  . Smokeless  tobacco: Never Used  . Alcohol Use: Yes     Comment: social beer  . Drug Use: No  . Sexual Activity: Yes    Birth Control/ Protection: Surgical   Other Topics Concern  . Not on file   Social History Narrative    FAMILY HISTORY: Family History  Problem Relation Age of Onset  . Diabetes type II Mother   . Diabetes type II Father   . Prostate cancer Father     Review of Systems  Constitutional: Negative.   HENT: Negative.   Eyes: Negative.   Respiratory: Negative.   Cardiovascular: Negative.   Gastrointestinal: Negative for heartburn, nausea, vomiting, abdominal pain, diarrhea, constipation, blood in stool and melena.       WEIGHT GAIN  Genitourinary: Positive for frequency. Negative for dysuria, urgency, hematuria and flank pain.  Musculoskeletal: Positive for back pain, joint finger pain.  Skin: Negative.   Neurological: Negative.   Endo/Heme/Allergies: Negative.   Psychiatric/Behavioral: Negative.   14 point review of systems was performed and is negative except as detailed under history of present illness and above   PHYSICAL EXAMINATION  ECOG PERFORMANCE STATUS: 1 - Symptomatic but completely ambulatory  Filed Vitals:   10/07/15 1252  BP: 132/87  Pulse: 84  Temp: 97.8 F (36.6 C)  Resp: 18    Physical Exam  Constitutional: She is oriented to person, place, and time and well-developed, well-nourished, and in no distress.  HENT:  Head: Normocephalic and atraumatic.  Nose: Nose normal.  Mouth/Throat: Oropharynx is clear and moist. No oropharyngeal exudate.  Eyes: Conjunctivae and EOM are normal. Pupils are equal, round, and reactive to light. Right eye exhibits no discharge. Left eye exhibits no discharge. No scleral icterus.  Neck: Normal range of motion. Neck supple. No tracheal deviation present. No thyromegaly present.  Cardiovascular: Normal rate, regular rhythm and normal heart sounds.  Exam reveals no gallop and no friction rub.   No murmur  heard. Pulmonary/Chest: Effort normal and breath sounds normal. She has no wheezes. She has no rales.  L breast 2-3:00 position, 5 cm from nipple, scar and indentation  R breast exam unremarkable for abnormalities Abdominal: Soft. Bowel sounds are normal. She exhibits no distension and no mass. There is no tenderness. There is no rebound and no guarding.  Musculoskeletal: Normal range of motion. She exhibits no edema.  Lymphadenopathy:    She has no cervical adenopathy.  Neurological: She is alert and oriented to person, place, and time. She has normal reflexes. No cranial nerve deficit. Gait normal. Coordination normal.  Skin: Skin is warm and dry. No rash noted.  Psychiatric: Mood, memory, affect and judgment normal.  Nursing note and vitals reviewed.   LABORATORY DATA: I have reviewed the data below as listed.   CBC    Component Value Date/Time   WBC 5.0 10/07/2015 1320   RBC 4.51 10/07/2015 1320  HGB 14.0 10/07/2015 1320   HCT 40.4 10/07/2015 1320   PLT 183 10/07/2015 1320   MCV 89.6 10/07/2015 1320   MCH 31.0 10/07/2015 1320   MCHC 34.7 10/07/2015 1320   RDW 12.9 10/07/2015 1320   LYMPHSABS 1.4 10/07/2015 1320   MONOABS 0.4 10/07/2015 1320   EOSABS 0.2 10/07/2015 1320   BASOSABS 0.0 10/07/2015 1320   CMP     Component Value Date/Time   NA 139 10/07/2015 1320   K 3.5 10/07/2015 1320   CL 105 10/07/2015 1320   CO2 27 10/07/2015 1320   GLUCOSE 128* 10/07/2015 1320   BUN 19 10/07/2015 1320   CREATININE 1.16* 10/07/2015 1320   CALCIUM 9.6 10/07/2015 1320   PROT 7.4 10/07/2015 1320   ALBUMIN 4.4 10/07/2015 1320   AST 26 10/07/2015 1320   ALT 27 10/07/2015 1320   ALKPHOS 138* 10/07/2015 1320   BILITOT 0.9 10/07/2015 1320   GFRNONAA 52* 10/07/2015 1320   GFRAA 60* 10/07/2015 1320     ASSESSMENT and THERAPY PLAN:  Stage I triple-negative left breast cancer, status post lumpectomy and sentinel node biopsy Left breast discomfort Anxiety/depression  She is due for  an upcoming mammogram and notes that it is scheduled. Her mood is improved but she struggles with anxiety.  After a discussion we decided to try lexapro. This has been called into her pharmacy.   Exam today is good and the patient was reassured.    Orders Placed This Encounter  Procedures  . CBC with Differential    Standing Status: Future     Number of Occurrences:      Standing Expiration Date: 10/06/2016  . Comprehensive metabolic panel    Standing Status: Future     Number of Occurrences:      Standing Expiration Date: 10/06/2016   I will plan on seeing her back again in 4 months. I again encouraged her to call and come in sooner if needed prior.  All questions were answered. The patient knows to call the clinic with any problems, questions or concerns. We can certainly see the patient much sooner if necessary.   This document serves as a record of services personally performed by Ancil Linsey, MD. It was created on her behalf by Janace Hoard, a trained medical scribe. The creation of this record is based on the scribe's personal observations and the provider's statements to them. This document has been checked and approved by the attending provider.  I have reviewed the above documentation for accuracy and completeness, and I agree with the above.  This note was electronically signed.  Kelby Fam. Whitney Muse, MD

## 2015-10-07 NOTE — Progress Notes (Signed)
..  Sally [name] presented for Portacath access and flushed  Portacath located rt chest wall accessed with  H 20 needle. no blood return present however flushes with ease. Portacath flushed with 75ml NS and 500U/63ml Heparin and needle removed intact.  Procedure tolerated well and without incident.   Marland KitchenInez Catalina D [name]'s reason for visit today are for labs as scheduled per MD orders.  Venipuncture performed with a 23 gauge butterfly needle to R Antecubital.  Sally [name] tolerated venipuncture well and without incident; questions were answered and patient was discharged.

## 2015-10-11 ENCOUNTER — Encounter (HOSPITAL_COMMUNITY): Payer: Self-pay | Admitting: Hematology & Oncology

## 2015-11-09 ENCOUNTER — Ambulatory Visit (HOSPITAL_COMMUNITY)
Admission: RE | Admit: 2015-11-09 | Discharge: 2015-11-09 | Disposition: A | Discharge status: Home / Self Care | Payer: 59 | Source: Ambulatory Visit | Attending: Hematology & Oncology | Admitting: Hematology & Oncology

## 2015-11-09 DIAGNOSIS — Z9221 Personal history of antineoplastic chemotherapy: Secondary | ICD-10-CM | POA: Insufficient documentation

## 2015-11-09 DIAGNOSIS — Z923 Personal history of irradiation: Secondary | ICD-10-CM | POA: Insufficient documentation

## 2015-11-09 DIAGNOSIS — C50912 Malignant neoplasm of unspecified site of left female breast: Secondary | ICD-10-CM

## 2015-11-09 DIAGNOSIS — Z853 Personal history of malignant neoplasm of breast: Secondary | ICD-10-CM | POA: Insufficient documentation

## 2015-12-02 ENCOUNTER — Encounter (HOSPITAL_COMMUNITY): Payer: 59 | Attending: Hematology and Oncology

## 2015-12-02 VITALS — BP 137/79 | HR 80 | Temp 98.1°F | Resp 18

## 2015-12-02 DIAGNOSIS — Z853 Personal history of malignant neoplasm of breast: Secondary | ICD-10-CM | POA: Insufficient documentation

## 2015-12-02 DIAGNOSIS — Z452 Encounter for adjustment and management of vascular access device: Secondary | ICD-10-CM | POA: Diagnosis not present

## 2015-12-02 DIAGNOSIS — C50912 Malignant neoplasm of unspecified site of left female breast: Secondary | ICD-10-CM | POA: Insufficient documentation

## 2015-12-02 MED ORDER — SODIUM CHLORIDE 0.9 % IJ SOLN
10.0000 mL | INTRAMUSCULAR | Status: DC | PRN
  Administered 2015-12-02: 10 mL via INTRAVENOUS
  Filled 2015-12-02: qty 10

## 2015-12-02 MED ORDER — HEPARIN SOD (PORK) LOCK FLUSH 100 UNIT/ML IV SOLN
500.0000 [IU] | Freq: Once | INTRAVENOUS | Status: AC
  Administered 2015-12-02: 500 [IU] via INTRAVENOUS

## 2015-12-02 MED ORDER — HEPARIN SOD (PORK) LOCK FLUSH 100 UNIT/ML IV SOLN
INTRAVENOUS | Status: AC
  Filled 2015-12-02: qty 5

## 2015-12-02 NOTE — Patient Instructions (Signed)
[place] at [facility] Discharge Instructions  RECOMMENDATIONS MADE BY THE CONSULTANT AND ANY TEST RESULTS WILL BE SENT TO YOUR REFERRING PHYSICIAN.  Port flush today.  Return as scheduled for MD appointment and port flush.    Thank you for choosing [place] at [facility] to provide your oncology and hematology care.  To afford each patient quality time with our provider, please arrive at least 15 minutes before your scheduled appointment time.    You need to re-schedule your appointment should you arrive 10 or more minutes late.  We strive to give you quality time with our providers, and arriving late affects you and other patients whose appointments are after yours.  Also, if you no show three or more times for appointments you may be dismissed from the clinic at the providers discretion.     Again, thank you for choosing [facility].  Our hope is that these requests will decrease the amount of time that you wait before being seen by our physicians.       _____________________________________________________________  Should you have questions after your visit to [facility], please contact our office at [phone] between the hours of 8:30 a.m. and 4:30 p.m.  Voicemails left after 4:30 p.m. will not be returned until the following business day.  For prescription refill requests, have your pharmacy contact our office.

## 2015-12-02 NOTE — Progress Notes (Signed)
Sally [name] presented for Portacath access and flush. Proper placement of portacath confirmed by CXR. Portacath located right chest wall accessed with  H 20 needle. Good blood return present. Portacath flushed with 20ml NS and 500U/5ml Heparin and needle removed intact. Procedure without incident. Patient tolerated procedure well.   

## 2016-02-10 ENCOUNTER — Encounter (HOSPITAL_BASED_OUTPATIENT_CLINIC_OR_DEPARTMENT_OTHER): Payer: 59

## 2016-02-10 ENCOUNTER — Encounter (HOSPITAL_COMMUNITY): Payer: Self-pay | Admitting: Hematology & Oncology

## 2016-02-10 ENCOUNTER — Encounter (HOSPITAL_COMMUNITY): Payer: 59 | Attending: Hematology and Oncology | Admitting: Hematology & Oncology

## 2016-02-10 VITALS — BP 140/89 | HR 93 | Temp 98.1°F | Resp 18 | Wt 159.0 lb

## 2016-02-10 DIAGNOSIS — C50912 Malignant neoplasm of unspecified site of left female breast: Secondary | ICD-10-CM

## 2016-02-10 DIAGNOSIS — Z95828 Presence of other vascular implants and grafts: Secondary | ICD-10-CM

## 2016-02-10 DIAGNOSIS — F341 Dysthymic disorder: Secondary | ICD-10-CM

## 2016-02-10 DIAGNOSIS — Z853 Personal history of malignant neoplasm of breast: Secondary | ICD-10-CM | POA: Diagnosis present

## 2016-02-10 DIAGNOSIS — Z171 Estrogen receptor negative status [ER-]: Secondary | ICD-10-CM

## 2016-02-10 DIAGNOSIS — F418 Other specified anxiety disorders: Secondary | ICD-10-CM

## 2016-02-10 LAB — CBC WITH DIFFERENTIAL/PLATELET
Basophils Absolute: 0 10*3/uL (ref 0.0–0.1)
Basophils Relative: 1 %
EOS ABS: 0.2 10*3/uL (ref 0.0–0.7)
Eosinophils Relative: 3 %
HCT: 39 % (ref 36.0–46.0)
HEMOGLOBIN: 13.1 g/dL (ref 12.0–15.0)
LYMPHS ABS: 1.5 10*3/uL (ref 0.7–4.0)
LYMPHS PCT: 26 %
MCH: 30.4 pg (ref 26.0–34.0)
MCHC: 33.6 g/dL (ref 30.0–36.0)
MCV: 90.5 fL (ref 78.0–100.0)
MONOS PCT: 8 %
Monocytes Absolute: 0.4 10*3/uL (ref 0.1–1.0)
NEUTROS PCT: 62 %
Neutro Abs: 3.5 10*3/uL (ref 1.7–7.7)
Platelets: 199 10*3/uL (ref 150–400)
RBC: 4.31 MIL/uL (ref 3.87–5.11)
RDW: 13 % (ref 11.5–15.5)
WBC: 5.6 10*3/uL (ref 4.0–10.5)

## 2016-02-10 LAB — COMPREHENSIVE METABOLIC PANEL
ALK PHOS: 144 U/L — AB (ref 38–126)
ALT: 29 U/L (ref 14–54)
ANION GAP: 8 (ref 5–15)
AST: 21 U/L (ref 15–41)
Albumin: 4.4 g/dL (ref 3.5–5.0)
BILIRUBIN TOTAL: 0.4 mg/dL (ref 0.3–1.2)
BUN: 29 mg/dL — ABNORMAL HIGH (ref 6–20)
CALCIUM: 9.6 mg/dL (ref 8.9–10.3)
CO2: 26 mmol/L (ref 22–32)
CREATININE: 1.04 mg/dL — AB (ref 0.44–1.00)
Chloride: 100 mmol/L — ABNORMAL LOW (ref 101–111)
GFR, EST NON AFRICAN AMERICAN: 58 mL/min — AB (ref >60)
Glucose, Bld: 142 mg/dL — ABNORMAL HIGH (ref 65–99)
Potassium: 3.4 mmol/L — ABNORMAL LOW (ref 3.5–5.1)
SODIUM: 134 mmol/L — AB (ref 135–145)
TOTAL PROTEIN: 7.4 g/dL (ref 6.5–8.1)

## 2016-02-10 MED ORDER — ESCITALOPRAM OXALATE 20 MG PO TABS: ORAL_TABLET | ORAL | Status: DC

## 2016-02-10 MED ORDER — HEPARIN SOD (PORK) LOCK FLUSH 100 UNIT/ML IV SOLN
INTRAVENOUS | Status: AC
  Filled 2016-02-10: qty 5

## 2016-02-10 MED ORDER — SODIUM CHLORIDE 0.9% FLUSH
10.0000 mL | INTRAVENOUS | Status: DC | PRN
  Administered 2016-02-10: 10 mL via INTRAVENOUS
  Filled 2016-02-10: qty 10

## 2016-02-10 MED ORDER — HEPARIN SOD (PORK) LOCK FLUSH 100 UNIT/ML IV SOLN
500.0000 [IU] | Freq: Once | INTRAVENOUS | Status: AC
  Administered 2016-02-10: 500 [IU] via INTRAVENOUS

## 2016-02-10 NOTE — Patient Instructions (Addendum)
[place] at [facility] Discharge Instructions  RECOMMENDATIONS MADE BY THE CONSULTANT AND ANY TEST RESULTS WILL BE SENT TO YOUR REFERRING PHYSICIAN.   Exam and discussion by Dr Whitney Muse today Port flush today Port flush today every 8 weeks Clinical breast exam today Lexapro given, please try this, give it a few weeks to help Return to see the doctor in 4 months with labs  Please call the clinic if you have any questions or concerns    Thank you for choosing [place] at [facility] to provide your oncology and hematology care.  To afford each patient quality time with our provider, please arrive at least 15 minutes before your scheduled appointment time.   Beginning January 23rd 2017 lab work for the [facility] will be done in the  Main lab at [facility] on 1st floor. If you have a lab appointment with the [facility] please come in thru the  Main Entrance and check in at the main information desk  You need to re-schedule your appointment should you arrive 10 or more minutes late.  We strive to give you quality time with our providers, and arriving late affects you and other patients whose appointments are after yours.  Also, if you no show three or more times for appointments you may be dismissed from the clinic at the providers discretion.     Again, thank you for choosing [facility].  Our hope is that these requests will decrease the amount of time that you wait before being seen by our physicians.       _____________________________________________________________  Should you have questions after your visit to [facility], please contact our office at [phone] between the hours of 8:30 a.m. and 4:30 p.m.  Voicemails left after 4:30 p.m. will not be returned until the following business day.  For prescription refill requests, have your pharmacy contact our office.

## 2016-02-10 NOTE — Progress Notes (Signed)
..  Sally [name] presented for Portacath access and flush.  Proper placement of portacath confirmed by CXR.  Portacath located rt  chest wall accessed with  H 20 needle.  Good blood return present. Portacath flushed with 43ml NS and 500U/65ml Heparin and needle removed intact.  Procedure tolerated well and without incident.

## 2016-02-10 NOTE — Progress Notes (Signed)
[place], MD [address] [place] [address]   Stage I triple-negative left breast cancer, status post lumpectomy and sentinel node biopsy  Completed 6 cycles of adjuvant chemotherapy, 3 cycles with TAC and 3 cycles with TC do to document a drop in ejection fraction which normalized at the conclusion of therapy. Radiotherapy was started on 05/12/2014 and completed on 07/03/2014.   Mammogram 10/2014  CURRENT THERAPY: Observation  INTERVAL HISTORY: Sally [name] 60 y.o. female returns for follow-up of her triple negative breast cancer. She gets occasional breast soreness. She is having trouble transitioning to normal life again in regards to diet, exercise and general activity, she continues to improve.  Sally [name] is here alone. The patient states that she is doing better. She has gained weight and is eating better. She is sleeping a lot but still gets out. Her breasts continue to feel tender while wearing a bra.  She continues to have issues at work, only now she has begun to let it go. She has had problems with organization lately. She is worried that she is over critical with too high of expectations for other people.   She did not begin taking her previously prescribed lexapro as she lost the prescription. The patient is requesting a new lexapro prescription. She is concerned whether her insurance will cover it.   She is up to date on her mammograms. Colonoscopy was in 2013, she had hyperplastic polyps. She has no new complaints today.   MEDICAL HISTORY: Past Medical History  Diagnosis Date  . Chronic sinusitis   . Hypertension   . Diabetes mellitus without complication (HCC)     diet controlled  . Cancer ([facility]) 11/12/2013    left breast cancer  . Invasive ductal carcinoma of left breast ([facility]) 12/11/2013  . 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  Outpatient MUGA scan performed on 02/25/2014 showed a drop in ejection  fraction from 72% to 59% and for that reason doxorubicin was discontinued from her chemotherapy regimen starting on cycle 4.   . Hives 05/2014  . Tobacco abuse 10/21/2014    has GANGLION-HAND/WRIST; Invasive ductal carcinoma of left breast ([place]); Vasomotor instability; Chemotherapy induced cardiomyopathy ([place]); and Tobacco abuse on her problem list.      Invasive ductal carcinoma of left breast ([facility])   09/24/2013 Initial Diagnosis Invasive ductal carcinoma of left breast   11/12/2013 Surgery Left breast lumpectomy by Dr. Arnoldo Morale- High grade invasive ductal carcinoma less than 0.1 cm from posterior and medial margins, 0/1 lymph nodes, triple negative   12/31/2013 - 04/20/2014 Chemotherapy TAC every 21 days.  Adriamycin dropped following cycle 3 for a decreased EF compared to baseline.     has No Known Allergies.  Sally [name] had no medications administered during this visit.  SURGICAL HISTORY: Past Surgical History  Procedure Laterality Date  . Ectopic pregnancy surgery    . Abdominal hysterectomy    . Left leg surgery      17 yrs of age  . Ganglion cyst excision    . Dilation and curettage of uterus    . Colonoscopy  01/09/2012    Procedure: COLONOSCOPY;  Surgeon: Dorothyann Peng, MD;  Location[facility];  Service: Endoscopy;  Laterality: N/A;  1:30 pm  . Inguinal hernia repair Left   . Partial mastectomy with needle localization and axillary sentinel lymph node bx Left 11/12/2013    Procedure: PARTIAL MASTECTOMY WITH NEEDLE LOCALIZATION AND AXILLARY SENTINEL  LYMPH NODE BX;  Surgeon: Jamesetta So, MD;  Location: [facility];  Service: General;  Laterality: Left;  . Portacath placement Right 12/29/2013    Procedure: INSERTION PORT-A-CATH;  Surgeon: Jamesetta So, MD;  Location: [facility];  Service: General;  Laterality: Right;    SOCIAL HISTORY: Social History   Social History  . Marital Status: Divorced    Spouse Name: N/A  . Number of Children: N/A  . Years of Education: N/A    Occupational History  . Not on file.   Social History Main Topics  . Smoking status: Former Smoker -- 0.25 packs/day for 15 years    Types: Cigarettes  . Smokeless tobacco: Never Used  . Alcohol Use: Yes     Comment: social beer  . Drug Use: No  . Sexual Activity: Yes    Birth Control/ Protection: Surgical   Other Topics Concern  . Not on file   Social History Narrative    FAMILY HISTORY: Family History  Problem Relation Age of Onset  . Diabetes type II Mother   . Diabetes type II Father   . Prostate cancer Father    Review of Systems  Constitutional: Negative.   Weight gain HENT: Negative.   Eyes: Negative.   Respiratory: Negative.   Cardiovascular: Negative.   Gastrointestinal: Negative for heartburn, nausea, vomiting, abdominal pain, diarrhea, constipation, blood in stool and melena.  Genitourinary: Negative for dysuria, urgency, hematuria and flank pain.  Musculoskeletal: Negative. Skin: Negative.   Neurological: Negative.   Endo/Heme/Allergies: Negative.   Psychiatric/Behavioral: Negative.   14 point review of systems was performed and is negative except as detailed under history of present illness and above  PHYSICAL EXAMINATION  ECOG PERFORMANCE STATUS: 1 - Symptomatic but completely ambulatory  Filed Vitals:   02/10/16 1335  BP: 140/89  Pulse: 93  Temp: 98.1 F (36.7 C)  Resp: 18    Physical Exam  Constitutional: She is oriented to person, place, and time and well-developed, well-nourished, and in no distress.  HENT:  Head: Normocephalic and atraumatic.  Nose: Nose normal.  Mouth/Throat: Oropharynx is clear and moist. No oropharyngeal exudate.  Eyes: Conjunctivae and EOM are normal. Pupils are equal, round, and reactive to light. Right eye exhibits no discharge. Left eye exhibits no discharge. No scleral icterus.  Neck: Normal range of motion. Neck supple. No tracheal deviation present. No thyromegaly present.  Cardiovascular: Normal rate,  regular rhythm and normal heart sounds.  Exam reveals no gallop and no friction rub.   No murmur heard. Pulmonary/Chest: Effort normal and breath sounds normal. She has no wheezes. She has no rales.  Left breast at the 2 o'clock position about 5 cm from the nipple is the incision site with some associated scar tissue, no palpable discrete masses. No suspicious skin changes. R breast exam unremarkable for abnormalities Abdominal: Soft. Bowel sounds are normal. She exhibits no distension and no mass. There is no tenderness. There is no rebound and no guarding.  Musculoskeletal: Normal range of motion. She exhibits no edema.  Lymphadenopathy:    She has no cervical adenopathy.  Neurological: She is alert and oriented to person, place, and time. She has normal reflexes. No cranial nerve deficit. Gait normal. Coordination normal.  Skin: Skin is warm and dry. No rash noted.  Psychiatric: Mood, memory, affect and judgment normal.  Nursing note and vitals reviewed.   LABORATORY DATA: I have reviewed the data below as listed.   CBC    Component Value Date/Time  WBC 5.6 02/10/2016 1415   RBC 4.31 02/10/2016 1415   HGB 13.1 02/10/2016 1415   HCT 39.0 02/10/2016 1415   PLT 199 02/10/2016 1415   MCV 90.5 02/10/2016 1415   MCH 30.4 02/10/2016 1415   MCHC 33.6 02/10/2016 1415   RDW 13.0 02/10/2016 1415   LYMPHSABS 1.5 02/10/2016 1415   MONOABS 0.4 02/10/2016 1415   EOSABS 0.2 02/10/2016 1415   BASOSABS 0.0 02/10/2016 1415   CMP     Component Value Date/Time   NA 134* 02/10/2016 1415   K 3.4* 02/10/2016 1415   CL 100* 02/10/2016 1415   CO2 26 02/10/2016 1415   GLUCOSE 142* 02/10/2016 1415   BUN 29* 02/10/2016 1415   CREATININE 1.04* 02/10/2016 1415   CALCIUM 9.6 02/10/2016 1415   PROT 7.4 02/10/2016 1415   ALBUMIN 4.4 02/10/2016 1415   AST 21 02/10/2016 1415   ALT 29 02/10/2016 1415   ALKPHOS 144* 02/10/2016 1415   BILITOT 0.4 02/10/2016 1415   GFRNONAA 58* 02/10/2016 1415   GFRAA  >60 02/10/2016 1415     ASSESSMENT and THERAPY PLAN:  Stage I triple-negative left breast cancer, status post lumpectomy and sentinel node biopsy Left breast discomfort Anxiety/depression  She is up to date on mammogram, breast exam and colonoscopy. She continues to slowly improve in regards to anxiety concerning her breast cancer.  Exam today is good and the patient was reassured.   The patient is requesting a new lexapro prescription. She is to call if she starts the medication and experiences any problems.  She will return in 6 months for routine follow up with labs, including CBC and CMP.   Orders Placed This Encounter  Procedures  . CBC with Differential    Standing Status: Future     Number of Occurrences:      Standing Expiration Date: 02/09/2017  . Comprehensive metabolic panel    Standing Status: Future     Number of Occurrences:      Standing Expiration Date: 02/09/2017    All questions were answered. The patient knows to call the clinic with any problems, questions or concerns. We can certainly see the patient much sooner if necessary.   This document serves as a record of services personally performed by Ancil Linsey, MD. It was created on her behalf by Arlyce Harman, a trained medical scribe. The creation of this record is based on the scribe's personal observations and the provider's statements to them. This document has been checked and approved by the attending provider.  I have reviewed the above documentation for accuracy and completeness, and I agree with the above.  This note was electronically signed.  Kelby Fam. Whitney Muse, MD

## 2016-04-06 ENCOUNTER — Encounter (HOSPITAL_COMMUNITY): Payer: 59

## 2016-04-24 ENCOUNTER — Encounter (HOSPITAL_COMMUNITY): Payer: 59 | Attending: Hematology and Oncology

## 2016-04-24 VITALS — BP 135/93 | HR 88 | Temp 98.0°F | Resp 18

## 2016-04-24 DIAGNOSIS — Z853 Personal history of malignant neoplasm of breast: Secondary | ICD-10-CM | POA: Insufficient documentation

## 2016-04-24 DIAGNOSIS — Z452 Encounter for adjustment and management of vascular access device: Secondary | ICD-10-CM

## 2016-04-24 DIAGNOSIS — C50912 Malignant neoplasm of unspecified site of left female breast: Secondary | ICD-10-CM | POA: Insufficient documentation

## 2016-04-24 MED ORDER — SODIUM CHLORIDE 0.9% FLUSH
20.0000 mL | INTRAVENOUS | Status: DC | PRN
  Administered 2016-04-24: 20 mL via INTRAVENOUS
  Filled 2016-04-24: qty 20

## 2016-04-24 MED ORDER — HEPARIN SOD (PORK) LOCK FLUSH 100 UNIT/ML IV SOLN
500.0000 [IU] | Freq: Once | INTRAVENOUS | Status: AC
  Administered 2016-04-24: 500 [IU] via INTRAVENOUS
  Filled 2016-04-24: qty 5

## 2016-04-24 NOTE — Progress Notes (Signed)
Mahsa D Weekly presented for Portacath access and flush. Proper placement of portacath confirmed by CXR. Portacath located right chest wall accessed with  H 20 needle. Good blood return present. Portacath flushed with 20ml NS and 500U/5ml Heparin and needle removed intact. Procedure without incident. Patient tolerated procedure well.   

## 2016-04-24 NOTE — Patient Instructions (Signed)
[facility] at [facility] Discharge Instructions  RECOMMENDATIONS MADE BY THE CONSULTANT AND ANY TEST RESULTS WILL BE SENT TO YOUR REFERRING PHYSICIAN.  Port flush today.    Thank you for choosing  Cancer Center at [facility] to provide your oncology and hematology care.  To afford each patient quality time with our provider, please arrive at least 15 minutes before your scheduled appointment time.   Beginning January 23rd 2017 lab work for the Cancer Center will be done in the  Main lab at [place] on 1st floor. If you have a lab appointment with the Cancer Center please come in thru the  Main Entrance and check in at the main information desk  You need to re-schedule your appointment should you arrive 10 or more minutes late.  We strive to give you quality time with our providers, and arriving late affects you and other patients whose appointments are after yours.  Also, if you no show three or more times for appointments you may be dismissed from the clinic at the providers discretion.     Again, thank you for choosing [facility].  Our hope is that these requests will decrease the amount of time that you wait before being seen by our physicians.       _____________________________________________________________  Should you have questions after your visit to  Cancer Center, please contact our office at [phone] between the hours of 8:30 a.m. and 4:30 p.m.  Voicemails left after 4:30 p.m. will not be returned until the following business day.  For prescription refill requests, have your pharmacy contact our office.         Resources For Cancer Patients and their Caregivers ? American Cancer Society: Can assist with transportation, wigs, general needs, runs Look Good Feel Better.        1-888-227-6333 ? Cancer Care: Provides financial assistance, online support groups, medication/co-pay assistance.   1-800-813-HOPE (4673) ? [facility] Assists [place] cancer patients and their families through emotional , educational and financial support.  336-427-4357 ? [place] DSS Where to apply for food stamps, Medicaid and utility assistance. 336-342-1394 ? RCATS: Transportation to medical appointments. 336-347-2287 ? Social Security Administration: May apply for disability if have a Stage IV cancer. 336-342-7796 1-800-772-1213 ? [place] Aging, Disability and Transit Services: Assists with nutrition, care and transit needs. 336-349-2343  

## 2016-06-08 ENCOUNTER — Other Ambulatory Visit (HOSPITAL_COMMUNITY): Payer: 59

## 2016-06-08 ENCOUNTER — Ambulatory Visit (HOSPITAL_COMMUNITY): Payer: 59 | Admitting: Oncology

## 2016-07-07 ENCOUNTER — Encounter (HOSPITAL_COMMUNITY): Payer: Self-pay

## 2016-07-07 ENCOUNTER — Encounter (HOSPITAL_COMMUNITY): Payer: 59 | Attending: Hematology and Oncology

## 2016-07-07 VITALS — BP 125/91 | HR 77 | Temp 98.2°F | Resp 18

## 2016-07-07 DIAGNOSIS — Z853 Personal history of malignant neoplasm of breast: Secondary | ICD-10-CM | POA: Insufficient documentation

## 2016-07-07 DIAGNOSIS — C50912 Malignant neoplasm of unspecified site of left female breast: Secondary | ICD-10-CM | POA: Diagnosis present

## 2016-07-07 DIAGNOSIS — Z95828 Presence of other vascular implants and grafts: Secondary | ICD-10-CM

## 2016-07-07 LAB — COMPREHENSIVE METABOLIC PANEL
ALBUMIN: 4.4 g/dL (ref 3.5–5.0)
ALK PHOS: 143 U/L — AB (ref 38–126)
ALT: 30 U/L (ref 14–54)
AST: 26 U/L (ref 15–41)
Anion gap: 7 (ref 5–15)
BUN: 16 mg/dL (ref 6–20)
CALCIUM: 9.2 mg/dL (ref 8.9–10.3)
CO2: 29 mmol/L (ref 22–32)
CREATININE: 0.94 mg/dL (ref 0.44–1.00)
Chloride: 100 mmol/L — ABNORMAL LOW (ref 101–111)
GFR calc Af Amer: >60 mL/min (ref >60)
GFR calc non Af Amer: >60 mL/min (ref >60)
GLUCOSE: 148 mg/dL — AB (ref 65–99)
Potassium: 3.5 mmol/L (ref 3.5–5.1)
SODIUM: 136 mmol/L (ref 135–145)
Total Bilirubin: 0.6 mg/dL (ref 0.3–1.2)
Total Protein: 7.3 g/dL (ref 6.5–8.1)

## 2016-07-07 LAB — CBC WITH DIFFERENTIAL/PLATELET
BASOS PCT: 1 %
Basophils Absolute: 0 10*3/uL (ref 0.0–0.1)
EOS ABS: 0.2 10*3/uL (ref 0.0–0.7)
Eosinophils Relative: 4 %
HCT: 39.6 % (ref 36.0–46.0)
HEMOGLOBIN: 13.3 g/dL (ref 12.0–15.0)
Lymphocytes Relative: 26 %
Lymphs Abs: 1.4 10*3/uL (ref 0.7–4.0)
MCH: 30.1 pg (ref 26.0–34.0)
MCHC: 33.6 g/dL (ref 30.0–36.0)
MCV: 89.6 fL (ref 78.0–100.0)
Monocytes Absolute: 0.4 10*3/uL (ref 0.1–1.0)
Monocytes Relative: 7 %
NEUTROS ABS: 3.3 10*3/uL (ref 1.7–7.7)
NEUTROS PCT: 62 %
Platelets: 176 10*3/uL (ref 150–400)
RBC: 4.42 MIL/uL (ref 3.87–5.11)
RDW: 12.7 % (ref 11.5–15.5)
WBC: 5.4 10*3/uL (ref 4.0–10.5)

## 2016-07-07 MED ORDER — HEPARIN SOD (PORK) LOCK FLUSH 100 UNIT/ML IV SOLN
500.0000 [IU] | Freq: Once | INTRAVENOUS | Status: AC
  Administered 2016-07-07: 500 [IU] via INTRAVENOUS

## 2016-07-07 MED ORDER — SODIUM CHLORIDE 0.9% FLUSH
10.0000 mL | INTRAVENOUS | Status: DC | PRN
  Administered 2016-07-07: 10 mL via INTRAVENOUS
  Filled 2016-07-07: qty 10

## 2016-07-07 NOTE — Patient Instructions (Signed)
[place] at [facility] Discharge Instructions  RECOMMENDATIONS MADE BY THE CONSULTANT AND ANY TEST RESULTS WILL BE SENT TO YOUR REFERRING PHYSICIAN.  Port flush with labs Port flushes every 8 weeks Follow up as scheduled Please call the clinic if you have any questions or concerns    Thank you for choosing [place] at [facility] to provide your oncology and hematology care.  To afford each patient quality time with our provider, please arrive at least 15 minutes before your scheduled appointment time.   Beginning January 23rd 2017 lab work for the [facility] will be done in the  Main lab at [facility] on 1st floor. If you have a lab appointment with the [place] please come in thru the  Main Entrance and check in at the main information desk  You need to re-schedule your appointment should you arrive 10 or more minutes late.  We strive to give you quality time with our providers, and arriving late affects you and other patients whose appointments are after yours.  Also, if you no show three or more times for appointments you may be dismissed from the clinic at the providers discretion.     Again, thank you for choosing [facility].  Our hope is that these requests will decrease the amount of time that you wait before being seen by our physicians.       _____________________________________________________________  Should you have questions after your visit to [facility], please contact our office at [phone] between the hours of 8:30 a.m. and 4:30 p.m.  Voicemails left after 4:30 p.m. will not be returned until the following business day.  For prescription refill requests, have your pharmacy contact our office.         Resources For Cancer Patients and their Caregivers ? American Cancer Society: Can assist with transportation, wigs, general needs, runs Look Good Feel Better.         9174031043 ? Cancer Care: Provides financial assistance, online support groups, medication/co-pay assistance.  1-800-813-HOPE 743-127-6801) ? [facility] Assists [place] cancer patients and their families through emotional , educational and financial support.  209-032-2890 ? [place] DSS Where to apply for food stamps, Medicaid and utility assistance. 443 237 8900 ? RCATS: Transportation to medical appointments. (782)834-9657 ? Social Security Administration: May apply for disability if have a Stage IV cancer. (204)457-3720 562-520-8848 ? [employer], Disability and Transit Services: Assists with nutrition, care and transit needs. [place] Support Programs: @10RELATIVEDAYS @ > Cancer Support Group  2nd Tuesday of the month 1pm-2pm, Journey Room  > Creative Journey  3rd Tuesday of the month 1130am-1pm, Journey Room  > Look Good Feel Better  1st Wednesday of the month 10am-12 noon, Journey Room (Call [place] to register [phone])

## 2016-07-07 NOTE — Progress Notes (Signed)
Sally [name] presented for Portacath access and flush.  Proper placement of portacath confirmed by CXR.  Portacath located right chest wall accessed with  H 20 needle.  Good blood return present. Portacath flushed with 20ml NS and 500U/5ml Heparin and needle removed intact.  Procedure tolerated well and without incident.    

## 2016-07-13 ENCOUNTER — Encounter (HOSPITAL_COMMUNITY): Payer: Self-pay | Admitting: Oncology

## 2016-07-13 ENCOUNTER — Other Ambulatory Visit (HOSPITAL_COMMUNITY): Payer: Self-pay | Admitting: Oncology

## 2016-07-13 ENCOUNTER — Encounter (HOSPITAL_BASED_OUTPATIENT_CLINIC_OR_DEPARTMENT_OTHER): Payer: 59 | Admitting: Oncology

## 2016-07-13 VITALS — BP 115/71 | HR 87 | Temp 97.8°F | Resp 16 | Wt 159.8 lb

## 2016-07-13 DIAGNOSIS — C50912 Malignant neoplasm of unspecified site of left female breast: Secondary | ICD-10-CM

## 2016-07-13 NOTE — Patient Instructions (Signed)
[place] at [facility] Discharge Instructions  RECOMMENDATIONS MADE BY THE CONSULTANT AND ANY TEST RESULTS WILL BE SENT TO YOUR REFERRING PHYSICIAN.  You had exam and was seen by Kirby Crigler today. You need mammogram in November. Follow up with Dr. Whitney Muse in 6 months with lab work.  Thank you for choosing [place] at [facility] to provide your oncology and hematology care.  To afford each patient quality time with our provider, please arrive at least 15 minutes before your scheduled appointment time.   Beginning January 23rd 2017 lab work for the [facility] will be done in the  Main lab at [facility] on 1st floor. If you have a lab appointment with the [place] please come in thru the  Main Entrance and check in at the main information desk  You need to re-schedule your appointment should you arrive 10 or more minutes late.  We strive to give you quality time with our providers, and arriving late affects you and other patients whose appointments are after yours.  Also, if you no show three or more times for appointments you may be dismissed from the clinic at the providers discretion.     Again, thank you for choosing [facility].  Our hope is that these requests will decrease the amount of time that you wait before being seen by our physicians.       _____________________________________________________________  Should you have questions after your visit to [facility], please contact our office at [phone] between the hours of 8:30 a.m. and 4:30 p.m.  Voicemails left after 4:30 p.m. will not be returned until the following business day.  For prescription refill requests, have your pharmacy contact our office.         Resources For Cancer Patients and their Caregivers ? American Cancer Society: Can assist with transportation, wigs, general needs, runs Look Good Feel Better.         5627351368 ? Cancer Care: Provides financial assistance, online support groups, medication/co-pay assistance.  1-800-813-HOPE (408) 791-4981) ? [facility] Assists [place] cancer patients and their families through emotional , educational and financial support.  442-796-9731 ? [place] DSS Where to apply for food stamps, Medicaid and utility assistance. 506-034-5696 ? RCATS: Transportation to medical appointments. 573-171-3120 ? Social Security Administration: May apply for disability if have a Stage IV cancer. ([phone] 509-152-6237 ? [employer], Disability and Transit Services: Assists with nutrition, care and transit needs. [place] Support Programs: @10RELATIVEDAYS @ > Cancer Support Group  2nd Tuesday of the month 1pm-2pm, Journey Room  > Creative Journey  3rd Tuesday of the month 1130am-1pm, Journey Room  > Look Good Feel Better  1st Wednesday of the month 10am-12 noon, Journey Room (Call [place] to register [phone])

## 2016-07-16 NOTE — Assessment & Plan Note (Addendum)
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 Arnoldo Morale on 11/12/2013 followed by systemic chemotherapy consisting of TAC x 6 cycles (Adriamycin was discontinued following cycle #3 due to decrease in LVEF) from 12/31/2013- 04/20/2014, followed by XRT from 05/12/2014- 07/03/2014.  Oncology history updated.   Labs on 07/07/2016: CBC diff, CMET.  I personally reviewed and went over laboratory results with the patient.  The results are noted within this dictation.  Lab work is unimpressive with insignificant abnormalities.  I personally reviewed and went over radiographic studies with the patient.  The results are noted within this dictation.  Mammogram on 11/08/2016 is negative for any new findings.  She is reminded that her next screening mammogram is due in November 2017.  Labs in 6 months: CBC diff, CMET.  She had a tick bite recently.  She notes that the tick was removed < 24 hours and it was not engorged.  All aspects of the tick have been removed and this is confirmed on physical exam.  She denies any new signs or symptoms suspicious for Lyme's Disease or [facility] Spotted Fever.  She has been using Betamethasone at home at the tick bite site.  She is not provided a Rx refill for this medication as she requests as the site is well healed without any open areas.  She may use OTC Neosporin or triple antibiotic cream/ointment PRN.    She is interested in a prescription for medical marijuana instead of Lexapro.  She has stopped her Lexapro due to "side effects" that the patient cannot describe well.  She is educated that medical marijuana is not legal in [place] and she is advised against the use of marijuana.   The patient wants her port removed and I think it would be reasonable to have her port removed following her upcoming mammogram as she is out 3 years from her malignancy.  Return in 6 months for follow-up.

## 2016-07-16 NOTE — Progress Notes (Signed)
[facility], [facility] [address] [place] [address]  Invasive ductal carcinoma of left breast ([place]) - Plan: MM DIAG BREAST TOMO BILATERAL, CBC with Differential, Comprehensive metabolic panel  CURRENT THERAPY: Surveillance per NCCN guidelines.  INTERVAL HISTORY: Sally [name] 60 y.o. 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 Arnoldo Morale on 11/12/2013 followed by systemic chemotherapy consisting of TAC x 6 cycles (Adriamycin was discontinued following cycle #3 due to decrease in LVEF) from 12/31/2013- 04/20/2014, followed by XRT from 05/12/2014- 07/03/2014.  She was recently bitten by a tick.  She notes that it was removed < 24 hours post bite.  The tick was not engorged.  Her tick bite site is well healed without any residual tick body parts impeded below the skin surface on transillumination.  She denies any signs or symptoms of Lyme's Disease or [facility] Spotted Fever.  She notes that she discontinued her Lexapro on her own and in its place, she is requesting medical marijuana.   Review of Systems  Constitutional: Negative.  Negative for chills, fever, malaise/fatigue and weight loss.  HENT: Negative.  Negative for hearing loss and tinnitus.   Eyes: Negative.  Negative for blurred vision and double vision.  Respiratory: Negative.  Negative for cough and shortness of breath.   Cardiovascular: Negative.  Negative for chest pain.  Gastrointestinal: Negative.  Negative for abdominal pain, constipation, diarrhea, nausea and vomiting.  Genitourinary: Negative.   Musculoskeletal: Negative.  Negative for back pain, joint pain, myalgias and neck pain.  Skin: Negative for itching and rash.  Neurological: Negative.  Negative for weakness and headaches.  Endo/Heme/Allergies: Negative.   Psychiatric/Behavioral: Negative.     Past Medical History:  Diagnosis Date  . Cancer  ([place]) 11/12/2013   left breast cancer  . 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  Outpatient MUGA scan performed on 02/25/2014 showed a drop in ejection fraction from 72% to 59% and for that reason doxorubicin was discontinued from her chemotherapy regimen starting on cycle 4.   . Chronic sinusitis   . Diabetes mellitus without complication ([facility])    diet controlled  . Hives 05/2014  . Hypertension   . Invasive ductal carcinoma of left breast ([place]) 12/11/2013  . Tobacco abuse 10/21/2014    Past Surgical History:  Procedure Laterality Date  . ABDOMINAL HYSTERECTOMY    . COLONOSCOPY  01/09/2012   Procedure: COLONOSCOPY;  Surgeon: Dorothyann Peng, MD;  Location: [facility] END[facility] SUITE;  Service: Endoscopy;  Laterality: N/A;  1:30 pm  . DILATION AND CURETTAGE OF UTERUS    . ECTOPIC PREGNANCY SURGERY    . GANGLION CYST EXCISION    . INGUINAL HERNIA REPAIR Left   . left leg surgery     17 yrs of age  . PARTIAL MASTECTOMY WITH NEEDLE LOCALIZATION AND AXILLARY SENTINEL LYMPH NODE BX Left 11/12/2013   Procedure: PARTIAL MASTECTOMY WITH NEEDLE LOCALIZATION AND AXILLARY SENTINEL LYMPH NODE BX;  Surgeon: Jamesetta So, MD;  Location: [facility] ORS;  Service: General;  Laterality: Left;  . PORTACATH PLACEMENT Right 12/29/2013   Procedure: INSERTION PORT-A-CATH;  Surgeon: Jamesetta So, MD;  Location: [facility] ORS;  Service: General;  Laterality: Right;    Family History  Problem Relation Age of Onset  . Diabetes type II Mother   . Diabetes type II Father   . Prostate cancer Father  Social History   Social History  . Marital status: Divorced    Spouse name: N/A  . Number of children: N/A  . Years of education: N/A   Social History Main Topics  . Smoking status: Former Smoker    Packs/day: 0.25    Years: 15.00    Types: Cigarettes  . Smokeless tobacco: Never Used  . Alcohol use Yes     Comment: social beer  . Drug use: No  . Sexual activity: Yes    Birth control/  protection: Surgical   Other Topics Concern  . None   Social History Narrative  . None     PHYSICAL EXAMINATION  ECOG PERFORMANCE STATUS: 0 - Asymptomatic  Vitals:   07/13/16 1400  BP: 115/71  Pulse: 87  Resp: 16  Temp: 97.8 F (36.6 C)    GENERAL:alert, healthy, no distress, well nourished, well developed, comfortable, cooperative, smiling and unaccompanied SKIN: skin color, texture, turgor are normal, no rashes or significant lesions HEAD: Normocephalic, No masses, lesions, tenderness or abnormalities EYES: normal, EOMI, Conjunctiva are pink and non-injected EARS: External ears normal OROPHARYNX:lips, buccal mucosa, and tongue normal and mucous membranes are moist  NECK: supple, no adenopathy, thyroid normal size, non-tender, without nodularity, no stridor, non-tender, trachea midline LYMPH:  no palpable lymphadenopathy, no hepatosplenomegaly BREAST:right breast normal without mass, skin or nipple changes or axillary nodes, left post-lumpectomy site well healed and free of suspicious changes LUNGS: clear to auscultation and percussion HEART: regular rate & rhythm, no murmurs, no gallops, S1 normal and S2 normal ABDOMEN:abdomen soft, non-tender, normal bowel sounds and no masses or organomegaly BACK: Back symmetric, no curvature., midline, T-spine wound is well healed without any open areas.  Transillumination of bite site is negative for any residual tick parts.  No erythema.  No rashes, lesions, or target rash. EXTREMITIES:less then 2 second capillary refill, no joint deformities, effusion, or inflammation, no edema, no skin discoloration, no clubbing, no cyanosis  NEURO: alert & oriented x 3 with fluent speech, no focal motor/sensory deficits, gait normal   LABORATORY DATA: CBC    Component Value Date/Time   WBC 5.4 07/07/2016 1515   RBC 4.42 07/07/2016 1515   HGB 13.3 07/07/2016 1515   HCT 39.6 07/07/2016 1515   PLT 176 07/07/2016 1515   MCV 89.6 07/07/2016 1515     MCH 30.1 07/07/2016 1515   MCHC 33.6 07/07/2016 1515   RDW 12.7 07/07/2016 1515   LYMPHSABS 1.4 07/07/2016 1515   MONOABS 0.4 07/07/2016 1515   EOSABS 0.2 07/07/2016 1515   BASOSABS 0.0 07/07/2016 1515      Chemistry      Component Value Date/Time   NA 136 07/07/2016 1515   K 3.5 07/07/2016 1515   CL 100 (L) 07/07/2016 1515   CO2 29 07/07/2016 1515   BUN 16 07/07/2016 1515   CREATININE 0.94 07/07/2016 1515      Component Value Date/Time   CALCIUM 9.2 07/07/2016 1515   ALKPHOS 143 (H) 07/07/2016 1515   AST 26 07/07/2016 1515   ALT 30 07/07/2016 1515   BILITOT 0.6 07/07/2016 1515        PENDING LABS:   RADIOGRAPHIC STUDIES:  No results found.   PATHOLOGY:    ASSESSMENT AND PLAN:  Invasive ductal carcinoma of left breast Stage IA (T1CN0M0) invasive ductal carcinoma of left breast, triple negative; S/P left breast lumpectomy by Dr. Arnoldo Morale on 11/12/2013 followed by systemic chemotherapy consisting of TAC x 6 cycles (Adriamycin was discontinued following cycle #  3 due to decrease in LVEF) from 12/31/2013- 04/20/2014, followed by XRT from 05/12/2014- 07/03/2014.  Oncology history updated.   Labs on 07/07/2016: CBC diff, CMET.  I personally reviewed and went over laboratory results with the patient.  The results are noted within this dictation.  Lab work is unimpressive with insignificant abnormalities.  I personally reviewed and went over radiographic studies with the patient.  The results are noted within this dictation.  Mammogram on 11/08/2016 is negative for any new findings.  She is reminded that her next screening mammogram is due in November 2017.  Labs in 6 months: CBC diff, CMET.  She had a tick bite recently.  She notes that the tick was removed < 24 hours and it was not engorged.  All aspects of the tick have been removed and this is confirmed on physical exam.  She denies any new signs or symptoms suspicious for Lyme's Disease or [facility] Spotted Fever.   She has been using Betamethasone at home at the tick bite site.  She is not provided a Rx refill for this medication as she requests as the site is well healed without any open areas.  She may use OTC Neosporin or triple antibiotic cream/ointment PRN.    She is interested in a prescription for medical marijuana instead of Lexapro.  She has stopped her Lexapro due to "side effects" that the patient cannot describe well.  She is educated that medical marijuana is not legal in [place] and she is advised against the use of marijuana.   The patient wants her port removed and I think it would be reasonable to have her port removed following her upcoming mammogram as she is out 3 years from her malignancy.  Return in 6 months for follow-up.    ORDERS PLACED FOR THIS ENCOUNTER: Orders Placed This Encounter  Procedures  . MM DIAG BREAST TOMO BILATERAL  . CBC with Differential  . Comprehensive metabolic panel    MEDICATIONS PRESCRIBED THIS ENCOUNTER: No orders of the defined types were placed in this encounter.   THERAPY PLAN:  NCCN guidelines recommends the following surveillance for invasive breast cancer (2.2017):  A. History and Physical exam 1-4 times per year as clinically appropriate for 5 years, then annually.  B. Periodic screening for changes in family history and referral to genetics counseling as indicated  C. Educate, monitor, and refer to lymphedema management.  D. Mammography every 12 months  E. Routine imaging of reconstructed breast is not indicated.  F. In the absence of clinical signs and symptoms suggestive of recurrent disease, there is no indication for laboratory or imaging studies for metastases screening.  G. Women on Tamoxifen: annual gynecologic assessment every 12 months if uterus is present.  H. Women on aromatase inhibitor or who experience ovarian failure secondary to treatment should have monitoring of bone health with a bone mineral density determination  at baseline and periodically thereafter.  I. Assess and encourage adherence to adjuvant endocrine therapy.  J. Evidence suggests that active lifestyle, healthy diet, limited alcohol intake, and achieving and maintaining an ideal body weight (20-25 BMI) may lead to optimal breast cancer outcomes.   All questions were answered. The patient knows to call the clinic with any problems, questions or concerns. We can certainly see the patient much sooner if necessary.  Patient and plan discussed with Dr. Ancil Linsey and she is in agreement with the aforementioned.   This note is electronically signed by: Doy Mince 07/16/2016 2:39 PM

## 2016-09-01 ENCOUNTER — Encounter (HOSPITAL_COMMUNITY): Payer: 59 | Attending: Hematology and Oncology

## 2016-09-01 DIAGNOSIS — Z452 Encounter for adjustment and management of vascular access device: Secondary | ICD-10-CM | POA: Diagnosis not present

## 2016-09-01 DIAGNOSIS — Z853 Personal history of malignant neoplasm of breast: Secondary | ICD-10-CM | POA: Insufficient documentation

## 2016-09-01 DIAGNOSIS — C50912 Malignant neoplasm of unspecified site of left female breast: Secondary | ICD-10-CM | POA: Diagnosis not present

## 2016-09-01 MED ORDER — SODIUM CHLORIDE 0.9% FLUSH
10.0000 mL | INTRAVENOUS | Status: DC | PRN
  Administered 2016-09-01: 10 mL via INTRAVENOUS
  Filled 2016-09-01: qty 10

## 2016-09-01 MED ORDER — HEPARIN SOD (PORK) LOCK FLUSH 100 UNIT/ML IV SOLN
500.0000 [IU] | Freq: Once | INTRAVENOUS | Status: AC
  Administered 2016-09-01: 500 [IU] via INTRAVENOUS
  Filled 2016-09-01: qty 5

## 2016-09-01 MED ORDER — LIDOCAINE-PRILOCAINE 2.5-2.5 % EX CREA: TOPICAL_CREAM | CUTANEOUS | 3 refills | Status: DC

## 2016-09-01 NOTE — Progress Notes (Signed)
Sally [name] presented for Portacath access and flush. Portacath located right chest wall accessed with  H 20 needle. Good blood return present. Portacath flushed with 41ml NS and 500U/71ml Heparin and needle removed intact. Procedure without incident. Patient tolerated procedure well.

## 2016-09-01 NOTE — Patient Instructions (Signed)
Sally [name] at [facility] Discharge Instructions  RECOMMENDATIONS MADE BY THE CONSULTANT AND ANY TEST RESULTS WILL BE SENT TO YOUR REFERRING PHYSICIAN.  Port a cath flushed today. Follow up as scheduled.  Thank you for choosing [place] at [facility] to provide your oncology and hematology care.  To afford each patient quality time with our provider, please arrive at least 15 minutes before your scheduled appointment time.   Beginning January 23rd 2017 lab work for the [facility] will be done in the  Main lab at [facility] on 1st floor. If you have a lab appointment with the [place] please come in thru the  Main Entrance and check in at the main information desk  You need to re-schedule your appointment should you arrive 10 or more minutes late.  We strive to give you quality time with our providers, and arriving late affects you and other patients whose appointments are after yours.  Also, if you no show three or more times for appointments you may be dismissed from the clinic at the providers discretion.     Again, thank you for choosing [facility].  Our hope is that these requests will decrease the amount of time that you wait before being seen by our physicians.       _____________________________________________________________  Should you have questions after your visit to [facility], please contact our office at [phone] between the hours of 8:30 a.m. and 4:30 p.m.  Voicemails left after 4:30 p.m. will not be returned until the following business day.  For prescription refill requests, have your pharmacy contact our office.         Resources For Cancer Patients and their Caregivers ? American Cancer Society: Can assist with transportation, wigs, general needs, runs Look Good Feel Better.        415-281-9084 ? Cancer Care: Provides financial assistance, online support groups,  medication/co-pay assistance.  1-800-813-HOPE (651) 256-5000) ? [place] Assists [place] cancer patients and their families through emotional , educational and financial support.  504-457-1545 ? [place] DSS Where to apply for food stamps, Medicaid and utility assistance. 984-771-5624 ? [employer]: Transportation to medical appointments. 3405187997 ? Social Security Administration: May apply for disability if have a Stage IV cancer. 4230038826 (518) 687-9205 ? [employer], Disability and Transit Services: Assists with nutrition, care and transit needs. [address] Support Programs: @10RELATIVEDAYS @ > Cancer Support Group  2nd Tuesday of the month 1pm-2pm, Journey Room  > Creative Journey  3rd Tuesday of the month 1130am-1pm, Journey Room  > Look Good Feel Better  1st Wednesday of the month 10am-12 noon, Journey Room (Call [facility] to register [phone])

## 2016-11-13 ENCOUNTER — Encounter (HOSPITAL_COMMUNITY): Payer: 59

## 2016-11-27 ENCOUNTER — Other Ambulatory Visit (HOSPITAL_COMMUNITY): Payer: Self-pay | Admitting: Oncology

## 2016-11-27 DIAGNOSIS — N6320 Unspecified lump in the left breast, unspecified quadrant: Secondary | ICD-10-CM

## 2016-11-28 ENCOUNTER — Ambulatory Visit (HOSPITAL_COMMUNITY)
Admission: RE | Admit: 2016-11-28 | Discharge: 2016-11-28 | Disposition: A | Discharge status: Home / Self Care | Payer: 59 | Source: Ambulatory Visit | Attending: Oncology | Admitting: Oncology

## 2016-11-28 DIAGNOSIS — C50912 Malignant neoplasm of unspecified site of left female breast: Secondary | ICD-10-CM | POA: Insufficient documentation

## 2016-11-29 ENCOUNTER — Encounter (HOSPITAL_COMMUNITY): Payer: Self-pay | Admitting: Lab

## 2016-11-29 ENCOUNTER — Encounter (HOSPITAL_COMMUNITY): Payer: 59 | Attending: Hematology and Oncology

## 2016-11-29 VITALS — BP 126/84 | HR 94 | Temp 98.1°F | Resp 20

## 2016-11-29 DIAGNOSIS — Z452 Encounter for adjustment and management of vascular access device: Secondary | ICD-10-CM

## 2016-11-29 DIAGNOSIS — Z95828 Presence of other vascular implants and grafts: Secondary | ICD-10-CM

## 2016-11-29 DIAGNOSIS — C50912 Malignant neoplasm of unspecified site of left female breast: Secondary | ICD-10-CM | POA: Insufficient documentation

## 2016-11-29 DIAGNOSIS — Z853 Personal history of malignant neoplasm of breast: Secondary | ICD-10-CM | POA: Insufficient documentation

## 2016-11-29 MED ORDER — SODIUM CHLORIDE 0.9% FLUSH
10.0000 mL | INTRAVENOUS | Status: DC | PRN
  Administered 2016-11-29: 10 mL via INTRAVENOUS
  Filled 2016-11-29: qty 10

## 2016-11-29 MED ORDER — HEPARIN SOD (PORK) LOCK FLUSH 100 UNIT/ML IV SOLN
500.0000 [IU] | Freq: Once | INTRAVENOUS | Status: AC
  Administered 2016-11-29: 500 [IU] via INTRAVENOUS

## 2016-11-29 NOTE — Progress Notes (Unsigned)
Referral sent to Dr Arnoldo Morale to have port out.  Records faxed on 12/6

## 2016-11-29 NOTE — Progress Notes (Signed)
Patient discharged ambulatory and in stable condition. Also, patient wanted her port removed and Dr. Whitney Muse approved this request. Patient to be set up with Dr. Arnoldo Morale or his partner to have port removed before the end of the year.   Sally [name] presented for Portacath access and flush. Proper placement of portacath confirmed by CXR. Portacath located rt chest wall accessed with  H 20 needle. No blood return present. Portacath flushed with 18ml NS and 500U/28ml Heparin and needle removed intact. Procedure without incident. Patient tolerated procedure well.

## 2016-12-05 NOTE — H&P (Signed)
  NTS SOAP Note  Vital Signs:  Vitals as of: Q000111Q: Systolic Q000111Q: Diastolic 98: Heart Rate 103: Temp 98.60F (Temporal): Height 83ft 1in: Weight 159Lbs 0 Ounces: BMI 30.04   BMI : 30.04 kg/m2  Subjective: This 60 year old female presents for of need for portacath removal.  Has finished chemotherapy for left breast cancer.  Review of Symptoms:  Constitutional:negative Head:negative Eyes:negative sinus problems Cardiovascular:negative Respiratory:negative Gastrointestinnegative Genitourinary:negative Musculoskeletal:negative Skin:negative Breast:negative Hematolgic/Lymphatic:negative Allergic/Immunologic:negative   Past Medical History:Reviewed  Past Medical History  Surgical History: left partial mastectomy/sentinel lymph node biopsy 11/14, portacath insertion 1/15, TAH, inguinal herniorrhaphy Medical Problems: left breast cancer Allergies: shrimp Medications: claritin   Social History:Reviewed  Social History  Preferred Language: English Race:  Black or African American Ethnicity: Not Hispanic / Latino Age: 89 year Marital Status:  D Alcohol: yes   Smoking Status: Current every day smoker reviewed on 12/05/2016 Started Date:  Packs per week:  Functional Status reviewed on 12/05/2016 ------------------------------------------------ Bathing: Normal Cooking: Normal Dressing: Normal Driving: Normal Eating: Normal Managing Meds: Normal Oral Care: Normal Shopping: Normal Toileting: Normal Transferring: Normal Walking: Normal Cognitive Status reviewed on 12/05/2016 ------------------------------------------------ Attention: Normal Decision Making: Normal Language: Normal Memory: Normal Motor: Normal Perception: Normal Problem Solving: Normal Visual and Spatial: Normal   Family History:Reviewed  Family Health History Mother, Living; Diabetes mellitus, unspecified type; Heart disease;  Father, Living; Stroke (CVA);      Objective Information: General:Well appearing, well nourished in no distress. Port in place right upper chest Neck:Supple without lymphadenopathy.  Heart:RRR, no murmur or gallop.  Normal S1, S2.  No S3, S4.  Lungs:CTA bilaterally, no wheezes, rhonchi, rales.  Breathing unlabored. Oncology notes reviewed Assessment:Left breast cancer, finished with chemotherapy  Diagnoses: 174.9  C50.912 Primary malignant neoplasm of female breast (Malignant neoplasm of unspecified site of left female breast)  Procedures: [id number] - OFFICE OUTPATIENT VISIT 25 MINUTES    Plan:  Scheduled for portacath removal in minor procedure room on 12/20/16.   Patient Education:Alternative treatments to surgery were discussed with patient (and family).Risks and benefits  of procedure were fully explained to the patient (and family) who gave informed consent. Patient/family questions were addressed.  Follow-up:Pending Surgery

## 2016-12-08 ENCOUNTER — Other Ambulatory Visit (HOSPITAL_COMMUNITY): Payer: Self-pay | Admitting: Oncology

## 2016-12-08 DIAGNOSIS — Z720 Tobacco use: Secondary | ICD-10-CM

## 2016-12-08 MED ORDER — BUPROPION HCL ER (SR) 150 MG PO TB12: ORAL_TABLET | ORAL | 2 refills | Status: DC

## 2016-12-20 ENCOUNTER — Encounter (HOSPITAL_COMMUNITY)
Admission: RE | Disposition: A | Discharge status: Home / Self Care | Payer: Self-pay | Source: Ambulatory Visit | Attending: General Surgery

## 2016-12-20 ENCOUNTER — Ambulatory Visit (HOSPITAL_COMMUNITY)
Admission: RE | Admit: 2016-12-20 | Discharge: 2016-12-20 | Disposition: A | Discharge status: Home / Self Care | Payer: 59 | Source: Ambulatory Visit | Attending: General Surgery | Admitting: General Surgery

## 2016-12-20 DIAGNOSIS — Z833 Family history of diabetes mellitus: Secondary | ICD-10-CM | POA: Insufficient documentation

## 2016-12-20 DIAGNOSIS — Z8249 Family history of ischemic heart disease and other diseases of the circulatory system: Secondary | ICD-10-CM | POA: Diagnosis not present

## 2016-12-20 DIAGNOSIS — Z452 Encounter for adjustment and management of vascular access device: Secondary | ICD-10-CM | POA: Insufficient documentation

## 2016-12-20 DIAGNOSIS — F17200 Nicotine dependence, unspecified, uncomplicated: Secondary | ICD-10-CM | POA: Diagnosis not present

## 2016-12-20 DIAGNOSIS — Z91013 Allergy to seafood: Secondary | ICD-10-CM | POA: Insufficient documentation

## 2016-12-20 DIAGNOSIS — Z823 Family history of stroke: Secondary | ICD-10-CM | POA: Insufficient documentation

## 2016-12-20 DIAGNOSIS — C50912 Malignant neoplasm of unspecified site of left female breast: Secondary | ICD-10-CM | POA: Diagnosis not present

## 2016-12-20 HISTORY — PX: PORT-A-CATH REMOVAL: SHX5289

## 2016-12-20 SURGERY — MINOR REMOVAL PORT-A-CATH
Anesthesia: LOCAL | Laterality: Right

## 2016-12-20 MED ORDER — LIDOCAINE HCL (PF) 1 % IJ SOLN
INTRAMUSCULAR | Status: AC
  Filled 2016-12-20: qty 30

## 2016-12-20 MED ORDER — LIDOCAINE HCL (PF) 1 % IJ SOLN
INTRAMUSCULAR | Status: DC | PRN
  Administered 2016-12-20: 3 mL

## 2016-12-20 MED ORDER — HYDROCODONE-ACETAMINOPHEN 5-325 MG PO TABS: 1.0000 | ORAL_TABLET | ORAL | 0 refills | Status: DC | PRN

## 2016-12-20 SURGICAL SUPPLY — 19 items
CLOTH BEACON ORANGE TIMEOUT ST (SAFETY) ×2 IMPLANT
DECANTER SPIKE VIAL GLASS SM (MISCELLANEOUS) ×2 IMPLANT
DRAPE PROXIMA HALF (DRAPES) ×2 IMPLANT
DURAPREP 6ML APPLICATOR 50/CS (WOUND CARE) ×2 IMPLANT
ELECT REM PT RETURN 9FT ADLT (ELECTROSURGICAL) ×2
ELECTRODE REM PT RTRN 9FT ADLT (ELECTROSURGICAL) ×1 IMPLANT
GLOVE BIOGEL PI IND STRL 7.0 (GLOVE) ×1 IMPLANT
GLOVE BIOGEL PI INDICATOR 7.0 (GLOVE) ×1
GLOVE SURG SS PI 7.5 STRL IVOR (GLOVE) ×2 IMPLANT
GOWN STRL REUS W/TWL LRG LVL3 (GOWN DISPOSABLE) ×2 IMPLANT
LIQUID BAND (GAUZE/BANDAGES/DRESSINGS) ×2 IMPLANT
NEEDLE HYPO 25X1 1.5 SAFETY (NEEDLE) ×2 IMPLANT
PENCIL HANDSWITCHING (ELECTRODE) ×2 IMPLANT
SPONGE GAUZE 2X2 8PLY STRL LF (GAUZE/BANDAGES/DRESSINGS) ×2 IMPLANT
SUT VIC AB 3-0 SH 27 (SUTURE) ×1
SUT VIC AB 3-0 SH 27X BRD (SUTURE) ×1 IMPLANT
SUT VIC AB 4-0 PS2 27 (SUTURE) ×2 IMPLANT
SYR CONTROL 10ML LL (SYRINGE) ×2 IMPLANT
TOWEL OR 17X26 4PK STRL BLUE (TOWEL DISPOSABLE) ×2 IMPLANT

## 2016-12-20 NOTE — Op Note (Signed)
Patient:  Sally [name]  DOB:  October 10, 1956  MRN:  [id number]   Preop Diagnosis:  Left breast cancer, finished with chemotherapy  Postop Diagnosis:  Same  Procedure:  Port-A-Cath removal  Surgeon:  Aviva Signs, M.D.  Anes:  Local  Indications:  Patient is a 60 year old black female who has finished chemotherapy for left breast cancer. She now presents for removal of the Port-A-Cath. The risks and benefits of the procedure including bleeding and infection were fully explained to the patient, who gave informed consent.  Procedure note:  The patient was placed the supine position. The right upper chest over the port site was prepped and draped using the usual sterile technique with DuraPrep. Surgical site confirmation was performed. 1% Xylocaine was used for local anesthesia.  An incision was made through the previous surgical incision site. Dissection was taken down to the Port-A-Cath. The Port-A-Cath was removed in total without difficulty. It was disposed of. The subcutaneous layer was reapproximated using 3-0 Vicryl interrupted suture. The skin was closed using a 4-0 Vicryl subcuticular suture. Dermabond was applied.  All tape and needle counts were correct at the end of the procedure. The patient was discharged in stable condition.  Complications:  None  EBL:  Minimal  Specimen:  None

## 2016-12-20 NOTE — Interval H&P Note (Signed)
History and Physical Interval Note:  12/20/2016 9:19 AM  Sally [name]  has presented today for surgery, with the diagnosis of left breast cancer  The various methods of treatment have been discussed with the patient and family. After consideration of risks, benefits and other options for treatment, the patient has consented to  Procedure(s): MINOR REMOVAL PORT-A-CATH (Right) as a surgical intervention .  The patient's history has been reviewed, patient examined, no change in status, stable for surgery.  I have reviewed the patient's chart and labs.  Questions were answered to the patient's satisfaction.     Aviva Signs A

## 2016-12-20 NOTE — Discharge Instructions (Signed)
Implanted Port Removal, Care After Introduction Refer to this sheet in the next few weeks. These instructions provide you with information about caring for yourself after your procedure. Your health care provider may also give you more specific instructions. Your treatment has been planned according to current medical practices, but problems sometimes occur. Call your health care provider if you have any problems or questions after your procedure. What can I expect after the procedure? After the procedure, it is common to have:  Soreness or pain near your incision.  Some swelling or bruising near your incision. Follow these instructions at home: Medicines  Take over-the-counter and prescription medicines only as told by your health care provider.  If you were prescribed an antibiotic medicine, take it as told by your health care provider. Do not stop taking the antibiotic even if you start to feel better. Bathing  Do not take baths, swim, or use a hot tub until your health care provider approves. Ask your health care provider if you can take showers. You may only be allowed to take sponge baths for bathing. Incision care  Follow instructions from your health care provider about how to take care of your incision. Make sure you:  Wash your hands with soap and water before you change your bandage (dressing). If soap and water are not available, use hand sanitizer.  Change your dressing as told by your health care provider.  Keep your dressing dry.  Leave stitches (sutures), skin glue, or adhesive strips in place. These skin closures may need to stay in place for 2 weeks or longer. If adhesive strip edges start to loosen and curl up, you may trim the loose edges. Do not remove adhesive strips completely unless your health care provider tells you to do that.  Check your incision area every day for signs of infection. Check for:  More redness, swelling, or pain.  More fluid or  blood.  Warmth.  Pus or a bad smell. Driving  If you received a sedative, do not drive for 24 hours after the procedure.  If you did not receive a sedative, ask your health care provider when it is safe to drive. Activity  Return to your normal activities as told by your health care provider. Ask your health care provider what activities are safe for you.  Until your health care provider says it is safe:  Do not lift anything that is heavier than 10 lb (4.5 kg).  Do not do activities that involve lifting your arms over your head. General instructions  Do not use any tobacco products, such as cigarettes, chewing tobacco, and e-cigarettes. Tobacco can delay healing. If you need help quitting, ask your health care provider.  Keep all follow-up visits as told by your health care provider. This is important. Contact a health care provider if:  You have more redness, swelling, or pain around your incision.  You have more fluid or blood coming from your incision.  Your incision feels warm to the touch.  You have pus or a bad smell coming from your incision.  You have a fever.  You have pain that is not relieved by your pain medicine. Get help right away if:  You have chest pain.  You have difficulty breathing. This information is not intended to replace advice given to you by your health care provider. Make sure you discuss any questions you have with your health care provider. Document Released: 11/22/2015 Document Revised: 05/18/2016 Document Reviewed: 09/15/2015  2017 [employer]

## 2016-12-21 ENCOUNTER — Other Ambulatory Visit: Payer: Self-pay | Admitting: Nurse Practitioner

## 2016-12-22 ENCOUNTER — Encounter (HOSPITAL_COMMUNITY): Payer: Self-pay | Admitting: General Surgery

## 2017-01-16 ENCOUNTER — Encounter (HOSPITAL_COMMUNITY): Payer: Self-pay | Admitting: Hematology & Oncology

## 2017-01-16 ENCOUNTER — Encounter (HOSPITAL_COMMUNITY): Payer: 59 | Attending: Hematology and Oncology | Admitting: Adult Health

## 2017-01-16 ENCOUNTER — Encounter (HOSPITAL_COMMUNITY): Payer: 59

## 2017-01-16 VITALS — BP 129/89 | HR 93 | Temp 98.2°F | Resp 16 | Wt 159.9 lb

## 2017-01-16 DIAGNOSIS — R7989 Other specified abnormal findings of blood chemistry: Secondary | ICD-10-CM

## 2017-01-16 DIAGNOSIS — C50912 Malignant neoplasm of unspecified site of left female breast: Secondary | ICD-10-CM | POA: Diagnosis present

## 2017-01-16 DIAGNOSIS — Z853 Personal history of malignant neoplasm of breast: Secondary | ICD-10-CM

## 2017-01-16 DIAGNOSIS — Z720 Tobacco use: Secondary | ICD-10-CM | POA: Diagnosis not present

## 2017-01-16 LAB — COMPREHENSIVE METABOLIC PANEL
ALT: 30 U/L (ref 14–54)
AST: 27 U/L (ref 15–41)
Albumin: 4.3 g/dL (ref 3.5–5.0)
Alkaline Phosphatase: 127 U/L — ABNORMAL HIGH (ref 38–126)
Anion gap: 9 (ref 5–15)
BUN: 25 mg/dL — ABNORMAL HIGH (ref 6–20)
CHLORIDE: 101 mmol/L (ref 101–111)
CO2: 27 mmol/L (ref 22–32)
CREATININE: 1.32 mg/dL — AB (ref 0.44–1.00)
Calcium: 9.5 mg/dL (ref 8.9–10.3)
GFR, EST AFRICAN AMERICAN: 50 mL/min — AB (ref >60)
GFR, EST NON AFRICAN AMERICAN: 44 mL/min — AB (ref >60)
Glucose, Bld: 144 mg/dL — ABNORMAL HIGH (ref 65–99)
Potassium: 3.6 mmol/L (ref 3.5–5.1)
Sodium: 137 mmol/L (ref 135–145)
TOTAL PROTEIN: 7.5 g/dL (ref 6.5–8.1)
Total Bilirubin: 0.7 mg/dL (ref 0.3–1.2)

## 2017-01-16 LAB — CBC WITH DIFFERENTIAL/PLATELET
Basophils Absolute: 0.1 10*3/uL (ref 0.0–0.1)
Basophils Relative: 1 %
EOS PCT: 3 %
Eosinophils Absolute: 0.2 10*3/uL (ref 0.0–0.7)
HCT: 41.9 % (ref 36.0–46.0)
Hemoglobin: 14.4 g/dL (ref 12.0–15.0)
LYMPHS ABS: 1.9 10*3/uL (ref 0.7–4.0)
LYMPHS PCT: 26 %
MCH: 30.8 pg (ref 26.0–34.0)
MCHC: 34.4 g/dL (ref 30.0–36.0)
MCV: 89.5 fL (ref 78.0–100.0)
Monocytes Absolute: 0.6 10*3/uL (ref 0.1–1.0)
Monocytes Relative: 9 %
Neutro Abs: 4.4 10*3/uL (ref 1.7–7.7)
Neutrophils Relative %: 61 %
PLATELETS: 208 10*3/uL (ref 150–400)
RBC: 4.68 MIL/uL (ref 3.87–5.11)
RDW: 13 % (ref 11.5–15.5)
WBC: 7.1 10*3/uL (ref 4.0–10.5)

## 2017-01-16 NOTE — Progress Notes (Signed)
[place], [place] [address]  PCP:  Rosita Fire, [place] [address]  [phone]   REASON FOR VISIT:  Routine follow-up for history of Stage IA left breast cancer, triple negative    CURRENT THERAPY: Observation    BRIEF ONCOLOGIC HISTORY:    Invasive ductal carcinoma of left breast ([place])   09/24/2013 Initial Diagnosis    Invasive ductal carcinoma of left breast      11/12/2013 Surgery    Left breast lumpectomy by Dr. Arnoldo Morale- High grade invasive ductal carcinoma less than 0.1 cm from posterior and medial margins, 0/1 lymph nodes, triple negative      12/31/2013 - 04/20/2014 Chemotherapy    TAC every 21 days.  Adriamycin dropped following cycle 3 for a decreased EF compared to baseline.      05/12/2014 - 07/03/2014 Radiation Therapy    XRT      Stage I triple-negative left breast cancer, status post lumpectomy and sentinel node biopsy  Completed 6 cycles of adjuvant chemotherapy, 3 cycles with TAC and 3 cycles with TC d/t documented drop in ejection fraction which normalized at the conclusion of therapy. Radiotherapy was started on 05/12/2014 and completed on 07/03/2014.     INTERVAL HISTORY: Sally [name] 61 y.o. female returns for follow-up of her triple negative breast cancer.  She reports occasional left breast soreness, particularly at the site of her previous lumpectomy surgery.  She had her port-a-cath removed 12/20/16; no residual issues with the exception of a little bit of bruising and soreness to the area.  Her last mammogram was in 11/2016 and was normal.  Overall she feels well. Denies any headaches, shortness of breath, cough, or new breast nodularity/concerns. Denies vaginal bleeding.  She does have a "spot" to her right lower back that she would like me to look at today; it has been there for about 1 month.    She does not exercise regularly; she wants to be more physically active but is  not sure what exercises she should do.  She has a great appetite and good energy levels. She continues to work regularly in [place].    She has resumed smoking. She smokes 3-4 cigarettes/day.  She is interested in quitting smoking.    Sally [name] is here unaccompanied today.      PAST MEDICAL/SURGICAL HISTORY: Past Medical History:  Diagnosis Date  . Cancer ([employer]) 11/12/2013   left breast cancer  . 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  Outpatient MUGA scan performed on 02/25/2014 showed a drop in ejection fraction from 72% to 59% and for that reason doxorubicin was discontinued from her chemotherapy regimen starting on cycle 4.   . Chronic sinusitis   . Diabetes mellitus without complication ([facility])    diet controlled  . Hives 05/2014  . Hypertension   . Invasive ductal carcinoma of left breast ([place]) 12/11/2013  . Tobacco abuse 10/21/2014   Past Surgical History:  Procedure Laterality Date  . ABDOMINAL HYSTERECTOMY    . COLONOSCOPY  01/09/2012   Procedure: COLONOSCOPY;  Surgeon: Dorothyann Peng, MD;  Location: [facility];  Service: Endoscopy;  Laterality: N/A;  1:30 pm  . DILATION AND CURETTAGE OF UTERUS    . ECTOPIC PREGNANCY SURGERY    . GANGLION CYST EXCISION    . INGUINAL HERNIA REPAIR Left   . left leg surgery     17 yrs of age  .  PARTIAL MASTECTOMY WITH NEEDLE LOCALIZATION AND AXILLARY SENTINEL LYMPH NODE BX Left 11/12/2013   Procedure: PARTIAL MASTECTOMY WITH NEEDLE LOCALIZATION AND AXILLARY SENTINEL LYMPH NODE BX;  Surgeon: Jamesetta So, MD;  Location: [facility] ORS;  Service: General;  Laterality: Left;  . PORT-A-CATH REMOVAL Right 12/20/2016   Procedure: MINOR REMOVAL PORT-A-CATH;  Surgeon: Aviva Signs, MD;  Location: [facility] OR[facility];  Service: General;  Laterality: Right;  . PORTACATH PLACEMENT Right 12/29/2013   Procedure: INSERTION PORT-A-CATH;  Surgeon: Jamesetta So, MD;  Location: [facility] OR[facility];  Service: General;  Laterality: Right;    CURRENT  MEDICATIONS:  Outpatient Encounter Prescriptions as of 01/16/2017  Medication Sig Note  . METFORMIN HCL PO Take by mouth.   Marland Kitchen atorvastatin (LIPITOR) 10 MG tablet Take 10 mg by mouth daily. 10/07/2015: MD is changing it  . fluticasone (FLONASE) 50 MCG/ACT nasal spray Place 2 sprays into both nostrils daily. (Patient not taking: Reported on 11/29/2016)   . loratadine (CLARITIN) 10 MG tablet Take 1 tablet (10 mg total) by mouth daily as needed.   . triamterene-hydrochlorothiazide (DYAZIDE) 37.5-25 MG per capsule Take 1 capsule by mouth daily.    . [DISCONTINUED] buPROPion (WELLBUTRIN SR) 150 MG 12 hr tablet Take 150 mg PO daily x 7 days, then increase to 150 mg PO BID x 7-12 weeks.  Stop smoking on day 5-7.  Do not take after 6 pm.   . [DISCONTINUED] Cholecalciferol (VITAMIN D3 ADULT GUMMIES PO) Take by mouth. Reported on 02/10/2016   . [DISCONTINUED] HYDROcodone-acetaminophen (NORCO) 5-325 MG tablet Take 1 tablet by mouth every 4 (four) hours as needed for moderate pain.   . [DISCONTINUED] lidocaine-prilocaine (EMLA) cream Apply a quarter size amount to port site 1 hour prior to chemo. Do not rub in. Cover with plastic wrap.   . [DISCONTINUED] Magnesium Hydroxide (MILK OF MAGNESIA PO) Take by mouth as needed. Reported on 02/10/2016    No facility-administered encounter medications on file as of 01/16/2017.    ALLERGIES:  Allergies  Allergen Reactions  . Other Other (See Comments)    lemons  . Shrimp [Shellfish Allergy]        SOCIAL HISTORY: Social History   Social History  . Marital status: Divorced    Spouse name: N/A  . Number of children: N/A  . Years of education: N/A   Occupational History  . Not on file.   Social History Main Topics  . Smoking status: Former Smoker    Packs/day: 0.25    Years: 15.00    Types: Cigarettes  . Smokeless tobacco: Never Used  . Alcohol use Yes     Comment: social beer  . Drug use: No  . Sexual activity: Yes    Birth control/ protection:  Surgical   Other Topics Concern  . Not on file   Social History Narrative  . No narrative on file    FAMILY HISTORY: Family History  Problem Relation Age of Onset  . Diabetes type II Mother   . Diabetes type II Father   . Prostate cancer Father     REVIEW OF SYSTEMS:  Review of Systems  Constitutional: Negative.   HENT: Negative.   Eyes: Negative.   Respiratory: Negative.   Cardiovascular: Negative.   Gastrointestinal: Negative.   Genitourinary: Negative.   Musculoskeletal: Negative.   Skin: Negative.   Neurological: Negative.   Endo/Heme/Allergies: Negative.   Psychiatric/Behavioral: Negative.   All other systems reviewed and are negative.   PHYSICAL EXAMINATION  ECOG PERFORMANCE STATUS: 1 -  Symptomatic but completely ambulatory  Vitals:   01/16/17 1138  BP: 129/89  Pulse: 93  Resp: 16  Temp: 98.2 F (36.8 C)   Filed Weights   01/16/17 1138  Weight: 159 lb 14.4 oz (72.5 kg)     Physical Exam  Constitutional: She is oriented to person, place, and time and well-developed, well-nourished, and in no distress.  HENT:  Head: Normocephalic and atraumatic.  Eyes: Conjunctivae and EOM are normal. Pupils are equal, round, and reactive to light.  Neck: Normal range of motion. Neck supple.  Cardiovascular: Normal rate, regular rhythm and normal heart sounds.   Pulmonary/Chest: Effort normal and breath sounds normal.    Abdominal: Soft. Bowel sounds are normal.  Musculoskeletal: Normal range of motion.  Lymphadenopathy:    She has no cervical adenopathy.    She has no axillary adenopathy.       Right: No supraclavicular adenopathy present.       Left: No supraclavicular adenopathy present.  Neurological: She is alert and oriented to person, place, and time. Gait normal.  Skin: Skin is warm and dry.     Psychiatric: Mood and affect normal.  Nursing note and vitals reviewed.    LABORATORY DATA: I have reviewed the data below as listed.   CBC Latest  Ref Rng & Units 01/16/2017 07/07/2016 02/10/2016  WBC 4.0 - 10.5 K/uL 7.1 5.4 5.6  Hemoglobin 12.0 - 15.0 g/dL 14.4 13.3 13.1  Hematocrit 36.0 - 46.0 % 41.9 39.6 39.0  Platelets 150 - 400 K/uL 208 176 199   CMP Latest Ref Rng & Units 01/16/2017 07/07/2016 02/10/2016  Glucose 65 - 99 mg/dL 144(H) 148(H) 142(H)  BUN 6 - 20 mg/dL 25(H) 16 29(H)  Creatinine 0.44 - 1.00 mg/dL 1.32(H) 0.94 1.04(H)  Sodium 135 - 145 mmol/L 137 136 134(L)  Potassium 3.5 - 5.1 mmol/L 3.6 3.5 3.4(L)  Chloride 101 - 111 mmol/L 101 100(L) 100(L)  CO2 22 - 32 mmol/L 27 29 26   Calcium 8.9 - 10.3 mg/dL 9.5 9.2 9.6  Total Protein 6.5 - 8.1 g/dL 7.5 7.3 7.4  Total Bilirubin 0.3 - 1.2 mg/dL 0.7 0.6 0.4  Alkaline Phos 38 - 126 U/L 127(H) 143(H) 144(H)  AST 15 - 41 U/L 27 26 21   ALT 14 - 54 U/L 30 30 29     DIAGNOSTIC IMAGING:  Last mammogram: 12/20/16    ASSESSMENT and THERAPY PLAN:  Stage I triple-negative left breast cancer (2014), status post lumpectomy and sentinel node biopsy Left breast discomfort Anxiety/depression Elevated BUN/Creatine   Stage IA left breast cancer, triple negative: -Ms. Blakeney is clinically and radiographically without evidence of disease or recurrence of breast cancer. She is now nearly 4 years out from definitive treatment without evidence of recurrence, which is very favorable.  Her last mammogram was 12/20/16 and was normal.  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    Elevated BUN/Creatinine: -Her BUN/Creatinine is elevated today at 25/1.32 (up from 16/0.94 in 06/2016).  She tells me she has been drinking plenty of fluids and does not clinically appear to be dehydrated.  She is on Metformin and anti-hypertensive agents, which can certainly elevate kidney function labs.  We will bring her back in 2 weeks for repeat  BUN/Creatinine labs; if they remain elevated, then we will refer her back to her PCP for additional management.  Smoking Cessation Counseling:  -We discussed the pathophysiology of nicotine dependence and different strategies to stop smoking. The gold standard of tobacco cessation is nicotine replacement (with nicotine patches & gum/lozenges) or Varenicline (Chantix).  We discussed that the typical craving/urge to smoke lasts about 3-5 minutes; her biggest trigger(s) to use cigarettes is while driving in her car to & from work.  Encouraged her to set some new "rules" that do not allow her to smoke in the car; instead she could use a piece of nicotine gum or a lozenge when she has a craving. I gave her instructions on how to use these nicotine replacement products.  SallyCharney  understands that data suggests that "cold [place]" is the least effective way to stop using tobacco products.  Having a clear "quit plan" is much more effective and requires a step-wise approach with continued support from a tobacco treatment specialist.  I encouraged her to reach out to me if she has further questions or interest in her continued cessation efforts.  Greater than 10 minutes was spent in smoking cessation counseling with this patient.     Health Maintenance/Wellness Promotion:  -She last saw her PCP in 11/2016 for physical.  She isn't sure when her last pap smear was completed; she does have h/o partial hysterectomy but is not sure if her cervix was removed; encouraged her to follow-up with her PCP if needed.  She does not take the flu shot, "because it made me so sick one time, I don't take them anymore."  Her last colonoscopy was in approx 2011; she was told to return in 2011.  She wants to work on being more physically active because she is not exercising regularly.  We discussed an opportunity for her to participate in the [place] program through the [facility].  I gave her the phone number for the  [facility] and encouraged her to contact them to get enrolled in [place].       Dispo:  -Return in 2 weeks for lab only visit to recheck BUN/Creatinine. -Encouraged smoking cessation efforts.  -Recommended she call the [facility] to get enrolled in [employer] fitness program for cancer survivors.  -Return to cancer center in 6 months for continued surveillance.     No orders of the defined types were placed in this encounter.   All questions were answered. The patient knows to call the clinic with any problems, questions or concerns. We can certainly see the patient much sooner if necessary.   Mike Craze, NP [place] [phone]

## 2017-01-16 NOTE — Patient Instructions (Addendum)
[place] at [facility] Discharge Instructions  RECOMMENDATIONS MADE BY THE CONSULTANT AND ANY TEST RESULTS WILL BE SENT TO YOUR REFERRING PHYSICIAN.  You were seen today by Mike Craze, NP.  Come back to the cancer center in 2 weeks for repeat labs to look at your kidney function.  We will see you back at the cancer center in 6 months for follow-up with Dr. Whitney Muse. To enroll in the [place] program at the [facility], call the [facility] at: [phone].   Thank you for choosing [place] at [facility] to provide your oncology and hematology care.  To afford each patient quality time with our provider, please arrive at least 15 minutes before your scheduled appointment time.    If you have a lab appointment with the [facility] please come in thru the  Main Entrance and check in at the main information desk  You need to re-schedule your appointment should you arrive 10 or more minutes late.  We strive to give you quality time with our providers, and arriving late affects you and other patients whose appointments are after yours.  Also, if you no show three or more times for appointments you may be dismissed from the clinic at the providers discretion.     Again, thank you for choosing [facility].  Our hope is that these requests will decrease the amount of time that you wait before being seen by our physicians.       _____________________________________________________________  Should you have questions after your visit to [facility], please contact our office at [phone] between the hours of 8:30 a.m. and 4:30 p.m.  Voicemails left after 4:30 p.m. will not be returned until the following business day.  For prescription refill requests, have your pharmacy contact our office.       Resources For Cancer Patients and their Caregivers ? American Cancer Society: Can assist with transportation, wigs,  general needs, runs Look Good Feel Better.        249-621-5419 ? Cancer Care: Provides financial assistance, online support groups, medication/co-pay assistance.  1-800-813-HOPE 407-095-0820) ? [facility] Assists [place] cancer patients and their families through emotional , educational and financial support.  (539)131-3842 ? [place] DSS Where to apply for food stamps, Medicaid and utility assistance. 779-795-0366 ? [facility]: Transportation to medical appointments. 650-332-9073 ? Social Security Administration: May apply for disability if have a Stage IV cancer. (856) 117-4399 (769) 676-2085 ? [employer], Disability and Transit Services: Assists with nutrition, care and transit needs. [place] Support Programs: @10RELATIVEDAYS @ > Cancer Support Group  2nd Tuesday of the month 1pm-2pm, Journey Room  > Creative Journey  3rd Tuesday of the month 1130am-1pm, Journey Room  > Look Good Feel Better  1st Wednesday of the month 10am-12 noon, Journey Room (Call [facility] to register [phone])

## 2017-01-30 ENCOUNTER — Encounter (HOSPITAL_COMMUNITY): Payer: 59 | Attending: Oncology

## 2017-01-30 DIAGNOSIS — C50912 Malignant neoplasm of unspecified site of left female breast: Secondary | ICD-10-CM | POA: Diagnosis present

## 2017-01-30 DIAGNOSIS — R7989 Other specified abnormal findings of blood chemistry: Secondary | ICD-10-CM

## 2017-01-30 LAB — CBC WITH DIFFERENTIAL/PLATELET
BASOS ABS: 0.1 10*3/uL (ref 0.0–0.1)
BASOS PCT: 1 %
Eosinophils Absolute: 0.2 10*3/uL (ref 0.0–0.7)
Eosinophils Relative: 3 %
HEMATOCRIT: 41.9 % (ref 36.0–46.0)
Hemoglobin: 14.6 g/dL (ref 12.0–15.0)
Lymphocytes Relative: 31 %
Lymphs Abs: 1.7 10*3/uL (ref 0.7–4.0)
MCH: 31.1 pg (ref 26.0–34.0)
MCHC: 34.8 g/dL (ref 30.0–36.0)
MCV: 89.3 fL (ref 78.0–100.0)
MONO ABS: 0.5 10*3/uL (ref 0.1–1.0)
Monocytes Relative: 9 %
NEUTROS ABS: 3.1 10*3/uL (ref 1.7–7.7)
NEUTROS PCT: 56 %
Platelets: 188 10*3/uL (ref 150–400)
RBC: 4.69 MIL/uL (ref 3.87–5.11)
RDW: 13.1 % (ref 11.5–15.5)
WBC: 5.5 10*3/uL (ref 4.0–10.5)

## 2017-01-30 LAB — COMPREHENSIVE METABOLIC PANEL
ALBUMIN: 4.3 g/dL (ref 3.5–5.0)
ALT: 40 U/L (ref 14–54)
AST: 29 U/L (ref 15–41)
Alkaline Phosphatase: 156 U/L — ABNORMAL HIGH (ref 38–126)
Anion gap: 9 (ref 5–15)
BILIRUBIN TOTAL: 0.6 mg/dL (ref 0.3–1.2)
BUN: 22 mg/dL — AB (ref 6–20)
CHLORIDE: 100 mmol/L — AB (ref 101–111)
CO2: 27 mmol/L (ref 22–32)
Calcium: 9.7 mg/dL (ref 8.9–10.3)
Creatinine, Ser: 1.08 mg/dL — ABNORMAL HIGH (ref 0.44–1.00)
GFR calc Af Amer: >60 mL/min (ref >60)
GFR calc non Af Amer: 55 mL/min — ABNORMAL LOW (ref >60)
GLUCOSE: 163 mg/dL — AB (ref 65–99)
Potassium: 3.7 mmol/L (ref 3.5–5.1)
Sodium: 136 mmol/L (ref 135–145)
TOTAL PROTEIN: 7.2 g/dL (ref 6.5–8.1)

## 2017-07-16 ENCOUNTER — Ambulatory Visit (HOSPITAL_COMMUNITY): Payer: 59 | Admitting: Adult Health

## 2017-07-16 ENCOUNTER — Other Ambulatory Visit (HOSPITAL_COMMUNITY): Payer: 59

## 2017-07-16 ENCOUNTER — Other Ambulatory Visit (HOSPITAL_COMMUNITY): Payer: Self-pay | Admitting: *Deleted

## 2017-07-16 DIAGNOSIS — C50912 Malignant neoplasm of unspecified site of left female breast: Secondary | ICD-10-CM

## 2017-07-16 NOTE — Progress Notes (Signed)
Sally [name], [place] [address]  PCP:  Rosita Fire, MD [address] [place] [address]  [phone]   REASON FOR VISIT:  Routine follow-up for history of Stage IA left breast cancer, triple negative    CURRENT THERAPY: Surveillance    BRIEF ONCOLOGIC HISTORY:    Invasive ductal carcinoma of left breast ([facility])   09/24/2013 Initial Diagnosis    Invasive ductal carcinoma of left breast      11/12/2013 Surgery    Left breast lumpectomy by Dr. Arnoldo Morale- High grade invasive ductal carcinoma less than 0.1 cm from posterior and medial margins, 0/1 lymph nodes, triple negative      12/31/2013 - 04/20/2014 Chemotherapy    TAC every 21 days.  Adriamycin dropped following cycle 3 for a decreased EF compared to baseline.      05/12/2014 - 07/03/2014 Radiation Therapy    XRT        INTERVAL HISTORY: Sally [name] 60 y.o. female returns for routine follow-up of her triple negative left breast cancer.    She is here today unaccompanied.  Overall, she tells me she has been doing very well. Her appetite is 100%; energy levels 50%. Denies any pain. She does have intermittent knee swelling, which she attributes to recent injury and it is improving.  She has occasional sinus issues, with rhinorrhea; she is trying different OTC products to try to help.   Denies any breast concerns. Endorses occasional (L) breast tenderness, particularly at previous lumpectomy site.  No new lumps/bumps; no skin or nipple changes.  Her mammogram is due in 11/1017.   She is interested in exercising more regularly and plans to call the [employer] about the [employer] program.  She sees her PCP regularly; tells me that she is up-to-date with her colonoscopy, pap smear, and immunizations.   She was working on cutting back on her cigarette use, "but it's not going good at all. I'm smoking more than before." Reports that she is smoking ~1/2 ppd. Her biggest triggers to  smoke are when she is around others who smoke. She has not tried any nicotine patches, lozenges, or gum in the past.       PAST MEDICAL/SURGICAL HISTORY: Past Medical History:  Diagnosis Date  . Cancer ([facility]) 11/12/2013   left breast cancer  . 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  Outpatient MUGA scan performed on 02/25/2014 showed a drop in ejection fraction from 72% to 59% and for that reason doxorubicin was discontinued from her chemotherapy regimen starting on cycle 4.   . Chronic sinusitis   . Diabetes mellitus without complication ([facility])    diet controlled  . Hives 05/2014  . Hypertension   . Invasive ductal carcinoma of left breast ([facility]) 12/11/2013  . Tobacco abuse 10/21/2014   Past Surgical History:  Procedure Laterality Date  . ABDOMINAL HYSTERECTOMY    . COLONOSCOPY  01/09/2012   Procedure: COLONOSCOPY;  Surgeon: Dorothyann Peng, MD;  Location: [facility];  Service: Endoscopy;  Laterality: N/A;  1:30 pm  . DILATION AND CURETTAGE OF UTERUS    . ECTOPIC PREGNANCY SURGERY    . GANGLION CYST EXCISION    . INGUINAL HERNIA REPAIR Left   . left leg surgery     17 yrs of age  . PARTIAL MASTECTOMY WITH NEEDLE LOCALIZATION AND AXILLARY SENTINEL LYMPH NODE BX Left 11/12/2013   Procedure: PARTIAL MASTECTOMY WITH NEEDLE LOCALIZATION AND AXILLARY SENTINEL  LYMPH NODE BX;  Surgeon: Jamesetta So, MD;  Location: [facility] ORS;  Service: General;  Laterality: Left;  . PORT-A-CATH REMOVAL Right 12/20/2016   Procedure: MINOR REMOVAL PORT-A-CATH;  Surgeon: Aviva Signs, MD;  Location: [facility] ORS;  Service: General;  Laterality: Right;  . PORTACATH PLACEMENT Right 12/29/2013   Procedure: INSERTION PORT-A-CATH;  Surgeon: Jamesetta So, MD;  Location: [facility] ORS;  Service: General;  Laterality: Right;    CURRENT MEDICATIONS:  Outpatient Encounter Prescriptions as of 07/17/2017  Medication Sig Note  . fluticasone (FLONASE) 50 MCG/ACT nasal spray Place 2 sprays into both  nostrils daily.   Marland Kitchen lisinopril-hydrochlorothiazide (PRINZIDE,ZESTORETIC) 20-12.5 MG tablet    . loratadine (CLARITIN) 10 MG tablet Take 1 tablet (10 mg total) by mouth daily as needed.   . metFORMIN (GLUCOPHAGE) 500 MG tablet    . METFORMIN HCL PO Take by mouth.   . triamterene-hydrochlorothiazide (DYAZIDE) 37.5-25 MG per capsule Take 1 capsule by mouth daily.    . [DISCONTINUED] atorvastatin (LIPITOR) 10 MG tablet Take 10 mg by mouth daily. 10/07/2015: MD is changing it   No facility-administered encounter medications on file as of 07/17/2017.    ALLERGIES:  Allergies  Allergen Reactions  . Other Other (See Comments)    lemons  . Shrimp [Shellfish Allergy]        SOCIAL HISTORY: Social History   Social History  . Marital status: Divorced    Spouse name: N/A  . Number of children: N/A  . Years of education: N/A   Occupational History  . Not on file.   Social History Main Topics  . Smoking status: Former Smoker    Packs/day: 0.25    Years: 15.00    Types: Cigarettes  . Smokeless tobacco: Never Used  . Alcohol use Yes     Comment: social beer  . Drug use: No  . Sexual activity: Yes    Birth control/ protection: Surgical   Other Topics Concern  . Not on file   Social History Narrative  . No narrative on file    FAMILY HISTORY: Family History  Problem Relation Age of Onset  . Diabetes type II Mother   . Diabetes type II Father   . Prostate cancer Father     REVIEW OF SYSTEMS:  Review of Systems  Constitutional: Positive for malaise/fatigue (fatigue).  HENT: Positive for sinus pain.        Runny nose at times  Eyes: Negative.   Respiratory: Negative.  Negative for cough and shortness of breath.   Cardiovascular: Positive for leg swelling (knee swelling).  Gastrointestinal: Negative.  Negative for abdominal pain, blood in stool, constipation, diarrhea, nausea and vomiting.  Genitourinary: Negative.  Negative for dysuria and hematuria.  Musculoskeletal:  Negative.  Negative for falls and joint pain.  Skin: Negative.  Negative for rash.  Neurological: Negative.  Negative for dizziness and headaches.  Endo/Heme/Allergies: Negative.  Does not bruise/bleed easily.  Psychiatric/Behavioral: Negative.   All other systems reviewed and are negative.     PHYSICAL EXAMINATION  ECOG PERFORMANCE STATUS: 1 - Symptomatic but completely ambulatory  Vitals:   07/17/17 1105  BP: (!) 136/92  Pulse: 89  Resp: 16   Filed Weights   07/17/17 1105  Weight: 156 lb (70.8 kg)     Physical Exam  Constitutional: She is oriented to person, place, and time and well-developed, well-nourished, and in no distress.  HENT:  Head: Normocephalic.  Mouth/Throat: Oropharynx is clear and moist. No oropharyngeal exudate.  Eyes:  Pupils are equal, round, and reactive to light. Conjunctivae are normal. No scleral icterus.  Neck: Normal range of motion. Neck supple.  Cardiovascular: Normal rate and regular rhythm.   Pulmonary/Chest: Effort normal and breath sounds normal. No respiratory distress.    Abdominal: Soft. Bowel sounds are normal. There is no tenderness.  Musculoskeletal: Normal range of motion. She exhibits no edema.  Lymphadenopathy:    She has no cervical adenopathy.       Right: No supraclavicular adenopathy present.       Left: No supraclavicular adenopathy present.  Neurological: She is alert and oriented to person, place, and time. No cranial nerve deficit. Gait normal.  Skin: Skin is warm and dry. No rash noted.  Psychiatric: Mood, memory, affect and judgment normal.  Nursing note and vitals reviewed.    LABORATORY DATA: I have reviewed the data below as listed.   CBC Latest Ref Rng & Units 07/17/2017 01/30/2017 01/16/2017  WBC 4.0 - 10.5 K/uL 7.3 5.5 7.1  Hemoglobin 12.0 - 15.0 g/dL 14.1 14.6 14.4  Hematocrit 36.0 - 46.0 % 41.1 41.9 41.9  Platelets 150 - 400 K/uL 199 188 208   CMP Latest Ref Rng & Units 07/17/2017 01/30/2017 01/16/2017    Glucose 65 - 99 mg/dL 128(H) 163(H) 144(H)  BUN 6 - 20 mg/dL 22(H) 22(H) 25(H)  Creatinine 0.44 - 1.00 mg/dL 1.08(H) 1.08(H) 1.32(H)  Sodium 135 - 145 mmol/L 139 136 137  Potassium 3.5 - 5.1 mmol/L 3.9 3.7 3.6  Chloride 101 - 111 mmol/L 100(L) 100(L) 101  CO2 22 - 32 mmol/L 31 27 27   Calcium 8.9 - 10.3 mg/dL 9.8 9.7 9.5  Total Protein 6.5 - 8.1 g/dL 7.7 7.2 7.5  Total Bilirubin 0.3 - 1.2 mg/dL 0.8 0.6 0.7  Alkaline Phos 38 - 126 U/L 144(H) 156(H) 127(H)  AST 15 - 41 U/L 25 29 27   ALT 14 - 54 U/L 25 40 30    DIAGNOSTIC IMAGING:  Last mammogram: 12/20/16    ASSESSMENT and THERAPY PLAN:  Stage I triple-negative left breast cancer (2014), status post lumpectomy and sentinel node biopsy Left breast discomfort Anxiety/depression Elevated BUN/Creatinine  Stage IA left breast cancer, triple negative: -Ms. Stigger is clinically and radiographically without evidence of disease or recurrence of breast cancer. This is very favorable.  -No role for anti-estrogen therapy given ER-.  -Clinical breast exam performed today and benign.  -Diagnostic mammogram due in 11/2017; orders placed today.  -Return to cancer center in 12/2017 for continued follow-up. After her 5-year visit, we can discuss moving her follow-up visits to annually in June each year with mammograms in December each year; this will allow either clinical breast exam or mammogram every 6 months in the future.     Smoking Cessation Counseling:  -We have discussed smoking cessation strategies in the past. We spent about 10 minutes again today reviewing possible techniques/tools for her to consider as she works on cutting back and eventually quitting smoking.  We discussed that the typical craving/urge to smoke lasts about 3-5 minutes; her biggest trigger(s) to use cigarettes is while driving in her car & when she is around others who smoke.  Encouraged her to set some new "rules" that do not allow her to smoke in her highest trigger  situations. She can also consider "rationing" the number of cigarettes she allows herself per day, in addition to using nicotine replacement products.   -Recommended she try 21 mg nicotine patch daily. She can then use the 4  mg nicotine lozenges for short-acting nicotine  (she would prefer lozenges over the gum given dental concerns). Reviewed instructions with her on how to use these products. These recommendations were also given to her in writing today at the end of our visit.  -Gave her information about the [facility] [place], which provides additional support and resources to those trying to stop smoking.    -Will continue to provide support at subsequent visits.    Health Maintenance/Wellness Promotion:  -Sees her PCP regularly and is reportedly up-to-date with her colonoscopy and pap smear.   -Could consider discussing lung cancer screening with low-dose CT chest at future visits.  -Maintain follow-up with PCP and other specialists as directed.  -There is a tremendous amount of research that supports women who are breast cancer survivors and exercise regularly have a lower risk of cancer recurrence.  Encouraged her to contact the [facility] to get enrolled in the [place] program, which is a great fitness program designed to help cancer survivors become more physically fit. It is also free of charge.       Dispo:  -Mammogram due in 11/2017; orders placed today.  -Return to cancer center in 12/2017.      All questions were answered. The patient knows to call the clinic with any problems, questions or concerns. We can certainly see the patient much sooner if necessary.   Plan of care discussed with Dr. Talbert Cage, who agrees with the above aforementioned.     Orders placed this encounter:  Orders Placed This Encounter  Procedures  . MM DIAG BREAST TOMO BILATERAL    Standing Status:   Future    Standing Expiration Date:   01/17/2019    Order Specific Question:   Reason for Exam  (SYMPTOM  OR DIAGNOSIS REQUIRED)    Answer:   history of left breast cancer; annual exam    Order Specific Question:   Is the patient pregnant?    Answer:   No    Order Specific Question:   Preferred imaging location?    Answer:   [place], NP [place] [phone]

## 2017-07-17 ENCOUNTER — Other Ambulatory Visit (HOSPITAL_COMMUNITY): Payer: 59

## 2017-07-17 ENCOUNTER — Encounter (HOSPITAL_COMMUNITY): Payer: Self-pay | Admitting: Adult Health

## 2017-07-17 ENCOUNTER — Ambulatory Visit (HOSPITAL_COMMUNITY): Payer: 59 | Admitting: Adult Health

## 2017-07-17 ENCOUNTER — Encounter (HOSPITAL_BASED_OUTPATIENT_CLINIC_OR_DEPARTMENT_OTHER): Payer: 59 | Admitting: Adult Health

## 2017-07-17 ENCOUNTER — Encounter (HOSPITAL_COMMUNITY): Payer: 59 | Attending: Oncology

## 2017-07-17 VITALS — BP 136/92 | HR 89 | Resp 16 | Ht 61.0 in | Wt 156.0 lb

## 2017-07-17 DIAGNOSIS — C50912 Malignant neoplasm of unspecified site of left female breast: Secondary | ICD-10-CM | POA: Insufficient documentation

## 2017-07-17 DIAGNOSIS — Z853 Personal history of malignant neoplasm of breast: Secondary | ICD-10-CM

## 2017-07-17 DIAGNOSIS — F17200 Nicotine dependence, unspecified, uncomplicated: Secondary | ICD-10-CM

## 2017-07-17 LAB — CBC WITH DIFFERENTIAL/PLATELET
BASOS ABS: 0 10*3/uL (ref 0.0–0.1)
Basophils Relative: 1 %
Eosinophils Absolute: 0.3 10*3/uL (ref 0.0–0.7)
Eosinophils Relative: 4 %
HEMATOCRIT: 41.1 % (ref 36.0–46.0)
HEMOGLOBIN: 14.1 g/dL (ref 12.0–15.0)
LYMPHS PCT: 28 %
Lymphs Abs: 2 10*3/uL (ref 0.7–4.0)
MCH: 30.3 pg (ref 26.0–34.0)
MCHC: 34.3 g/dL (ref 30.0–36.0)
MCV: 88.2 fL (ref 78.0–100.0)
MONO ABS: 0.7 10*3/uL (ref 0.1–1.0)
Monocytes Relative: 9 %
NEUTROS ABS: 4.3 10*3/uL (ref 1.7–7.7)
NEUTROS PCT: 58 %
Platelets: 199 10*3/uL (ref 150–400)
RBC: 4.66 MIL/uL (ref 3.87–5.11)
RDW: 13.1 % (ref 11.5–15.5)
WBC: 7.3 10*3/uL (ref 4.0–10.5)

## 2017-07-17 LAB — COMPREHENSIVE METABOLIC PANEL
ALT: 25 U/L (ref 14–54)
AST: 25 U/L (ref 15–41)
Albumin: 4.5 g/dL (ref 3.5–5.0)
Alkaline Phosphatase: 144 U/L — ABNORMAL HIGH (ref 38–126)
Anion gap: 8 (ref 5–15)
BILIRUBIN TOTAL: 0.8 mg/dL (ref 0.3–1.2)
BUN: 22 mg/dL — AB (ref 6–20)
CO2: 31 mmol/L (ref 22–32)
CREATININE: 1.08 mg/dL — AB (ref 0.44–1.00)
Calcium: 9.8 mg/dL (ref 8.9–10.3)
Chloride: 100 mmol/L — ABNORMAL LOW (ref 101–111)
GFR calc Af Amer: >60 mL/min (ref >60)
GFR calc non Af Amer: 55 mL/min — ABNORMAL LOW (ref >60)
GLUCOSE: 128 mg/dL — AB (ref 65–99)
Potassium: 3.9 mmol/L (ref 3.5–5.1)
Sodium: 139 mmol/L (ref 135–145)
TOTAL PROTEIN: 7.7 g/dL (ref 6.5–8.1)

## 2017-07-17 NOTE — Patient Instructions (Addendum)
[place]ally [name] at [facility] Discharge Instructions  RECOMMENDATIONS MADE BY THE CONSULTANT AND ANY TEST RESULTS WILL BE SENT TO YOUR REFERRING PHYSICIAN.  You were seen today by Mike Craze NP. Mammogram is due in December of this year. Return in January for labs and follow up.    Thank you for choosing [place] at [facility] to provide your oncology and hematology care.  To afford each patient quality time with our provider, please arrive at least 15 minutes before your scheduled appointment time.    If you have a lab appointment with the [facility] please come in thru the  Main Entrance and check in at the main information desk  You need to re-schedule your appointment should you arrive 10 or more minutes late.  We strive to give you quality time with our providers, and arriving late affects you and other patients whose appointments are after yours.  Also, if you no show three or more times for appointments you may be dismissed from the clinic at the providers discretion.     Again, thank you for choosing [facility].  Our hope is that these requests will decrease the amount of time that you wait before being seen by our physicians.       _____________________________________________________________  Should you have questions after your visit to [facility], please contact our office at [phone] between the hours of 8:30 a.m. and 4:30 p.m.  Voicemails left after 4:30 p.m. will not be returned until the following business day.  For prescription refill requests, have your pharmacy contact our office.       Resources For Cancer Patients and their Caregivers ? American Cancer Society: Can assist with transportation, wigs, general needs, runs Look Good Feel Better.        313-498-6892 ? Cancer Care: Provides financial assistance, online support groups, medication/co-pay assistance.  1-800-813-HOPE  (502) 859-5722) ? [place] Assists [place] cancer patients and their families through emotional , educational and financial support.  951-650-8391 ? [place] DSS Where to apply for food stamps, Medicaid and utility assistance. (202)559-3326 ? RCATS: Transportation to medical appointments. 305-376-9342 ? Social Security Administration: May apply for disability if have a Stage IV cancer. (360)234-5576 2141688368 ? [employer], Disability and Transit Services: Assists with nutrition, care and transit needs. [place] Support Programs: @10RELATIVEDAYS @ > Cancer Support Group  2nd Tuesday of the month 1pm-2pm, Journey Room  > Creative Journey  3rd Tuesday of the month 1130am-1pm, Journey Room  > Look Good Feel Better  1st Wednesday of the month 10am-12 noon, Journey Room (Call [facility] to register [phone])

## 2017-11-27 ENCOUNTER — Other Ambulatory Visit (HOSPITAL_COMMUNITY): Payer: Self-pay | Admitting: Oncology

## 2017-11-27 DIAGNOSIS — N6320 Unspecified lump in the left breast, unspecified quadrant: Secondary | ICD-10-CM

## 2017-12-04 ENCOUNTER — Encounter (HOSPITAL_COMMUNITY): Payer: 59

## 2017-12-20 ENCOUNTER — Encounter (HOSPITAL_COMMUNITY): Payer: 59

## 2017-12-20 ENCOUNTER — Other Ambulatory Visit (HOSPITAL_COMMUNITY): Payer: Self-pay | Admitting: Adult Health

## 2017-12-20 ENCOUNTER — Ambulatory Visit (HOSPITAL_COMMUNITY)
Admission: RE | Admit: 2017-12-20 | Discharge: 2017-12-20 | Disposition: A | Discharge status: Home / Self Care | Payer: 59 | Source: Ambulatory Visit | Attending: Adult Health | Admitting: Adult Health

## 2017-12-20 ENCOUNTER — Other Ambulatory Visit (HOSPITAL_COMMUNITY): Payer: Self-pay | Admitting: Oncology

## 2017-12-20 ENCOUNTER — Encounter (HOSPITAL_COMMUNITY): Payer: Self-pay

## 2017-12-20 DIAGNOSIS — N6320 Unspecified lump in the left breast, unspecified quadrant: Secondary | ICD-10-CM

## 2017-12-20 DIAGNOSIS — N63 Unspecified lump in unspecified breast: Secondary | ICD-10-CM

## 2017-12-20 DIAGNOSIS — N6314 Unspecified lump in the right breast, lower inner quadrant: Secondary | ICD-10-CM | POA: Insufficient documentation

## 2017-12-20 DIAGNOSIS — Z9889 Other specified postprocedural states: Secondary | ICD-10-CM | POA: Diagnosis not present

## 2017-12-20 DIAGNOSIS — N6313 Unspecified lump in the right breast, lower outer quadrant: Secondary | ICD-10-CM | POA: Diagnosis not present

## 2017-12-20 DIAGNOSIS — N6321 Unspecified lump in the left breast, upper outer quadrant: Secondary | ICD-10-CM | POA: Insufficient documentation

## 2017-12-20 DIAGNOSIS — N6312 Unspecified lump in the right breast, upper inner quadrant: Secondary | ICD-10-CM | POA: Insufficient documentation

## 2017-12-20 DIAGNOSIS — C50912 Malignant neoplasm of unspecified site of left female breast: Secondary | ICD-10-CM

## 2017-12-20 DIAGNOSIS — N6311 Unspecified lump in the right breast, upper outer quadrant: Secondary | ICD-10-CM | POA: Insufficient documentation

## 2017-12-20 HISTORY — DX: Personal history of antineoplastic chemotherapy: Z92.21

## 2017-12-20 HISTORY — DX: Personal history of irradiation: Z92.3

## 2018-01-17 ENCOUNTER — Ambulatory Visit (HOSPITAL_COMMUNITY): Payer: 59 | Admitting: Adult Health

## 2018-01-17 ENCOUNTER — Other Ambulatory Visit (HOSPITAL_COMMUNITY): Payer: 59

## 2018-01-22 ENCOUNTER — Other Ambulatory Visit (HOSPITAL_COMMUNITY): Payer: Self-pay | Admitting: *Deleted

## 2018-01-22 DIAGNOSIS — C50912 Malignant neoplasm of unspecified site of left female breast: Secondary | ICD-10-CM

## 2018-01-23 ENCOUNTER — Encounter (HOSPITAL_COMMUNITY): Payer: Self-pay | Admitting: Internal Medicine

## 2018-01-23 ENCOUNTER — Other Ambulatory Visit: Payer: Self-pay

## 2018-01-23 ENCOUNTER — Inpatient Hospital Stay (HOSPITAL_COMMUNITY): Payer: 59 | Attending: Adult Health

## 2018-01-23 ENCOUNTER — Inpatient Hospital Stay (HOSPITAL_BASED_OUTPATIENT_CLINIC_OR_DEPARTMENT_OTHER): Payer: 59 | Admitting: Internal Medicine

## 2018-01-23 VITALS — BP 124/87 | HR 82 | Resp 16 | Ht 61.0 in | Wt 153.0 lb

## 2018-01-23 DIAGNOSIS — Z171 Estrogen receptor negative status [ER-]: Secondary | ICD-10-CM | POA: Insufficient documentation

## 2018-01-23 DIAGNOSIS — C50912 Malignant neoplasm of unspecified site of left female breast: Secondary | ICD-10-CM

## 2018-01-23 DIAGNOSIS — I10 Essential (primary) hypertension: Secondary | ICD-10-CM

## 2018-01-23 DIAGNOSIS — Z923 Personal history of irradiation: Secondary | ICD-10-CM | POA: Diagnosis not present

## 2018-01-23 DIAGNOSIS — E119 Type 2 diabetes mellitus without complications: Secondary | ICD-10-CM | POA: Insufficient documentation

## 2018-01-23 DIAGNOSIS — C50412 Malignant neoplasm of upper-outer quadrant of left female breast: Secondary | ICD-10-CM | POA: Insufficient documentation

## 2018-01-23 DIAGNOSIS — Z87891 Personal history of nicotine dependence: Secondary | ICD-10-CM | POA: Diagnosis not present

## 2018-01-23 LAB — COMPREHENSIVE METABOLIC PANEL
ALBUMIN: 4.1 g/dL (ref 3.5–5.0)
ALT: 27 U/L (ref 14–54)
AST: 23 U/L (ref 15–41)
Alkaline Phosphatase: 149 U/L — ABNORMAL HIGH (ref 38–126)
Anion gap: 11 (ref 5–15)
BUN: 19 mg/dL (ref 6–20)
CO2: 27 mmol/L (ref 22–32)
CREATININE: 0.97 mg/dL (ref 0.44–1.00)
Calcium: 9.5 mg/dL (ref 8.9–10.3)
Chloride: 100 mmol/L — ABNORMAL LOW (ref 101–111)
GFR calc Af Amer: >60 mL/min (ref >60)
GFR calc non Af Amer: >60 mL/min (ref >60)
GLUCOSE: 135 mg/dL — AB (ref 65–99)
Potassium: 3.4 mmol/L — ABNORMAL LOW (ref 3.5–5.1)
SODIUM: 138 mmol/L (ref 135–145)
Total Bilirubin: 0.8 mg/dL (ref 0.3–1.2)
Total Protein: 7.1 g/dL (ref 6.5–8.1)

## 2018-01-23 LAB — CBC WITH DIFFERENTIAL/PLATELET
BASOS PCT: 0 %
Basophils Absolute: 0 10*3/uL (ref 0.0–0.1)
EOS ABS: 0.2 10*3/uL (ref 0.0–0.7)
Eosinophils Relative: 3 %
HCT: 40.9 % (ref 36.0–46.0)
HEMOGLOBIN: 13.4 g/dL (ref 12.0–15.0)
Lymphocytes Relative: 27 %
Lymphs Abs: 1.7 10*3/uL (ref 0.7–4.0)
MCH: 29.5 pg (ref 26.0–34.0)
MCHC: 32.8 g/dL (ref 30.0–36.0)
MCV: 90.1 fL (ref 78.0–100.0)
Monocytes Absolute: 0.4 10*3/uL (ref 0.1–1.0)
Monocytes Relative: 6 %
NEUTROS PCT: 64 %
Neutro Abs: 4 10*3/uL (ref 1.7–7.7)
Platelets: 177 10*3/uL (ref 150–400)
RBC: 4.54 MIL/uL (ref 3.87–5.11)
RDW: 13 % (ref 11.5–15.5)
WBC: 6.3 10*3/uL (ref 4.0–10.5)

## 2018-01-23 NOTE — Patient Instructions (Addendum)
[place] at [facility] Discharge Instructions  RECOMMENDATIONS MADE BY THE CONSULTANT AND ANY TEST RESULTS WILL BE SENT TO YOUR REFERRING PHYSICIAN.  Return to see MD in 6 months with labs. If you have to see the radiologist, then we will have you back sooner.   It is okay to take your Hair, Skin, and Nail vitamin. Start with only taking 1 tablet a day.   Mammogram report given to you today. Labs from today given to you today. Labs were normal. Copy of labs given to you.   Someone from [facility] will call you this week to let her what the Radiologist wants to do.   Thank you for choosing [place] at [facility] to provide your oncology and hematology care.  To afford each patient quality time with our provider, please arrive at least 15 minutes before your scheduled appointment time.    If you have a lab appointment with the [facility] please come in thru the  Main Entrance and check in at the main information desk  You need to re-schedule your appointment should you arrive 10 or more minutes late.  We strive to give you quality time with our providers, and arriving late affects you and other patients whose appointments are after yours.  Also, if you no show three or more times for appointments you may be dismissed from the clinic at the providers discretion.     Again, thank you for choosing [facility].  Our hope is that these requests will decrease the amount of time that you wait before being seen by our physicians.       _____________________________________________________________  Should you have questions after your visit to [facility], please contact our office at [phone] between the hours of 8:30 a.m. and 4:30 p.m.  Voicemails left after 4:30 p.m. will not be returned until the following business day.  For prescription refill requests, have your pharmacy contact our office.       Resources For  Cancer Patients and their Caregivers ? American Cancer Society: Can assist with transportation, wigs, general needs, runs Look Good Feel Better.        202-614-7814 ? Cancer Care: Provides financial assistance, online support groups, medication/co-pay assistance.  1-800-813-HOPE (820)433-8907) ? [place] Assists [place] cancer patients and their families through emotional , educational and financial support.  848-200-7003 ? [place] DSS Where to apply for food stamps, Medicaid and utility assistance. 386 751 5240 ? RCATS: Transportation to medical appointments. 316-430-7946 ? Social Security Administration: May apply for disability if have a Stage IV cancer. (912) 626-3944 716-776-9232 ? [employer], Disability and Transit Services: Assists with nutrition, care and transit needs. [place] Support Programs: @10RELATIVEDAYS @ > Cancer Support Group  2nd Tuesday of the month 1pm-2pm, Journey Room  > Creative Journey  3rd Tuesday of the month 1130am-1pm, Journey Room  > Look Good Feel Better  1st Wednesday of the month 10am-12 noon, Journey Room (Call [facility] to register [phone])

## 2018-01-25 NOTE — Progress Notes (Signed)
Diagnosis Staging Cancer Staging Invasive ductal carcinoma of left breast ([place]) Staging form: Breast, AJCC 7th Edition - Clinical: Stage IA (T1c, N0, cM0) - Signed by Baird Cancer, PA-C on 01/06/2014   Assessment/Plan:  1.  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  The patient had a mammogram performed on 12/20/2017 that showed:  6 x 4 x 6 mm probable complicated cyst left breast 2 o'clock position 3 cm from the nipple.  A 7 x 5 x 9 mm oval circumscribed hypoechoic mass right breast 3 o'clock position 1 cm from the nipple.  A 3 x 8 x 8 mm oval circumscribed hypoechoic mass right breast 9 o'clock position 3 cm from the nipple.  The patient has option of bilateral diagnostic mammogram and bilateral breast ultrasound in 6 months to assure stability of the lesions.  I discussed the case with Dr. Arnoldo Morale as well as Dr. Lovey Newcomer of radiology..  The patient will be set up for ultrasound aspiration of the left breast cyst and core biopsies on the right breast.  She will return to clinic to go over the results of the procedures.  2.  Right breast fibrocystic disease.  She had a right breast biopsy performed in October 2014 that returned with fibrocystic changes.  3.  Hypertension.  Blood pressure is 124/87.  Follow with primary care physician as recommended.  Interval history: Stage IA (T1CN0M0) invasive ductal carcinoma of left breast, triple negative; S/P left breast lumpectomy by Dr. Arnoldo Morale on 11/12/2013 followed by systemic chemotherapy consisting of TAC x 6 cycles (Adriamycin was discontinued following cycle #3 due to decrease in LVEF) from 12/31/2013- 04/20/2014, followed by XRT from 05/12/2014- 07/03/2014.  Bilateral diagnostic mammogram done 11/28/2016 was negative.  She had a right breast biopsy in October 2014 that showed fibrocystic changes in the right breast.  Current status :  patient is seen today for follow-up.  She is here to go over her  mammogram.  She denies any complaints today.     Invasive ductal carcinoma of left breast ([place])   09/24/2013 Initial Diagnosis    Invasive ductal carcinoma of left breast      11/12/2013 Surgery    Left breast lumpectomy by Dr. Arnoldo Morale- High grade invasive ductal carcinoma less than 0.1 cm from posterior and medial margins, 0/1 lymph nodes, triple negative      12/31/2013 - 04/20/2014 Chemotherapy    TAC every 21 days.  Adriamycin dropped following cycle 3 for a decreased EF compared to baseline.      05/12/2014 - 07/03/2014 Radiation Therapy    XRT      Problem List Patient Active Problem List   Diagnosis Date Noted  . Tobacco abuse [Z72.0] 10/21/2014  . 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 Vasomotor instability [R55] 02/16/2014  . Invasive ductal carcinoma of left breast ([place]) [C50.912] 12/11/2013  . HTN (hypertension) [I10] 11/12/2011  . GANGLION-HAND/WRIST [M67.40] 03/04/2008    Past Medical History Past Medical History:  Diagnosis Date  . Cancer ([place]) 11/12/2013   left breast cancer  . 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  Outpatient MUGA scan performed on 02/25/2014 showed a drop in ejection fraction from 72% to 59% and for that reason doxorubicin was discontinued from her chemotherapy regimen starting on cycle 4.   . Chronic sinusitis   . Diabetes mellitus without complication (HCC)    diet controlled  . Hives 05/2014  . Hypertension   .  Invasive ductal carcinoma of left breast ([place]) 12/11/2013  . Personal history of chemotherapy   . Personal history of radiation therapy   . Tobacco abuse 10/21/2014    Past Surgical History Past Surgical History:  Procedure Laterality Date  . ABDOMINAL HYSTERECTOMY    . COLONOSCOPY  01/09/2012   Procedure: COLONOSCOPY;  Surgeon: Dorothyann Peng, MD;  Location[facility];  Service: Endoscopy;  Laterality: N/A;  1:30 pm  . DILATION AND CURETTAGE OF UTERUS     . ECTOPIC PREGNANCY SURGERY    . GANGLION CYST EXCISION    . INGUINAL HERNIA REPAIR Left   . left leg surgery     17 yrs of age  . PARTIAL MASTECTOMY WITH NEEDLE LOCALIZATION AND AXILLARY SENTINEL LYMPH NODE BX Left 11/12/2013   Procedure: PARTIAL MASTECTOMY WITH NEEDLE LOCALIZATION AND AXILLARY SENTINEL LYMPH NODE BX;  Surgeon: Jamesetta So, MD;  Location: [facility];  Service: General;  Laterality: Left;  . PORT-A-CATH REMOVAL Right 12/20/2016   Procedure: MINOR REMOVAL PORT-A-CATH;  Surgeon: Aviva Signs, MD;  Location: [facility];  Service: General;  Laterality: Right;  . PORTACATH PLACEMENT Right 12/29/2013   Procedure: INSERTION PORT-A-CATH;  Surgeon: Jamesetta So, MD;  Location: [facility];  Service: General;  Laterality: Right;    Family History Family History  Problem Relation Age of Onset  . Diabetes type II Mother   . Diabetes type II Father   . Prostate cancer Father      Social History  reports that she has quit smoking. Her smoking use included cigarettes. She has a 3.75 pack-year smoking history. she has never used smokeless tobacco. She reports that she drinks alcohol. She reports that she does not use drugs.  Medications  Current Outpatient Medications:  .  amLODipine (NORVASC) 5 MG tablet, , Disp: , Rfl:  .  atorvastatin (LIPITOR) 10 MG tablet, , Disp: , Rfl:  .  fluticasone (FLONASE) 50 MCG/ACT nasal spray, Place 2 sprays into both nostrils daily., Disp: 16 g, Rfl: 12 .  lisinopril-hydrochlorothiazide (PRINZIDE,ZESTORETIC) 20-12.5 MG tablet, , Disp: , Rfl:  .  loratadine (CLARITIN) 10 MG tablet, Take 1 tablet (10 mg total) by mouth daily as needed., Disp: 100 tablet, Rfl: 3 .  metFORMIN (GLUCOPHAGE) 850 MG tablet, , Disp: , Rfl:  .  triamterene-hydrochlorothiazide (DYAZIDE) 37.5-25 MG per capsule, Take 1 capsule by mouth daily. , Disp: , Rfl:   Allergies Other and Shrimp [shellfish allergy]  Review of Systems Review of Systems - Oncology ROS as per HPI otherwise 12  point ROS is negative.   Physical Exam  Vitals Wt Readings from Last 3 Encounters:  01/23/18 153 lb (69.4 kg)  07/17/17 156 lb (70.8 kg)  01/16/17 159 lb 14.4 oz (72.5 kg)   Temp Readings from Last 3 Encounters:  01/16/17 98.2 F (36.8 C) (Oral)  12/20/16 98.1 F (36.7 C)   BP Readings from Last 3 Encounters:  01/23/18 124/87  07/17/17 (!) 136/92  01/16/17 129/89   Pulse Readings from Last 3 Encounters:  01/23/18 82  07/17/17 89  01/16/17 93   Constitutional: Well-developed, well-nourished, and in no distress.   HENT:  Head: Normocephalic and atraumatic.  Mouth/Throat: No oropharyngeal exudate. Mucosa moist. Eyes: Pupils are equal, round, and reactive to light. Conjunctivae are normal. No scleral icterus.  Neck: Normal range of motion. Neck supple. No JVD present.  Cardiovascular: Normal rate, regular rhythm and normal heart sounds.  Exam reveals no gallop and no friction rub.  No murmur heard. Pulmonary/Chest: Effort normal and breath sounds normal. No respiratory distress. No wheezes.No rales.  Abdominal: Soft. Bowel sounds are normal. No distension. There is no tenderness. There is no guarding.  Musculoskeletal: No edema or tenderness.  Lymphadenopathy: No palpable cervical, axillary or supraclavicular adenopathy.  Neurological: Alert and oriented to person, place, and time. No cranial nerve deficit.  Skin: Skin is warm and dry. No rash noted. No erythema. No pallor.  Psychiatric: Affect and judgment normal.  Bilateral breast exam shows no dominant masses no nipple retractions.  She has evidence of prior left lumpectomy.  Labs Appointment on 01/23/2018  Component Date Value Ref Range Status  . WBC 01/23/2018 6.3  4.0 - 10.5 K/uL Final  . RBC 01/23/2018 4.54  3.87 - 5.11 MIL/uL Final  . Hemoglobin 01/23/2018 13.4  12.0 - 15.0 g/dL Final  . HCT 01/23/2018 40.9  36.0 - 46.0 % Final  . MCV 01/23/2018 90.1  78.0 - 100.0 fL Final  . MCH 01/23/2018 29.5  26.0 - 34.0  pg Final  . MCHC 01/23/2018 32.8  30.0 - 36.0 g/dL Final  . RDW 01/23/2018 13.0  11.5 - 15.5 % Final  . Platelets 01/23/2018 177  150 - 400 K/uL Final  . Neutrophils Relative % 01/23/2018 64  % Final  . Neutro Abs 01/23/2018 4.0  1.7 - 7.7 K/uL Final  . Lymphocytes Relative 01/23/2018 27  % Final  . Lymphs Abs 01/23/2018 1.7  0.7 - 4.0 K/uL Final  . Monocytes Relative 01/23/2018 6  % Final  . Monocytes Absolute 01/23/2018 0.4  0.1 - 1.0 K/uL Final  . Eosinophils Relative 01/23/2018 3  % Final  . Eosinophils Absolute 01/23/2018 0.2  0.0 - 0.7 K/uL Final  . Basophils Relative 01/23/2018 0  % Final  . Basophils Absolute 01/23/2018 0.0  0.0 - 0.1 K/uL Final  . Sodium 01/23/2018 138  135 - 145 mmol/L Final  . Potassium 01/23/2018 3.4* 3.5 - 5.1 mmol/L Final  . Chloride 01/23/2018 100* 101 - 111 mmol/L Final  . CO2 01/23/2018 27  22 - 32 mmol/L Final  . Glucose, Bld 01/23/2018 135* 65 - 99 mg/dL Final  . BUN 01/23/2018 19  6 - 20 mg/dL Final  . Creatinine, Ser 01/23/2018 0.97  0.44 - 1.00 mg/dL Final  . Calcium 01/23/2018 9.5  8.9 - 10.3 mg/dL Final  . Total Protein 01/23/2018 7.1  6.5 - 8.1 g/dL Final  . Albumin 01/23/2018 4.1  3.5 - 5.0 g/dL Final  . AST 01/23/2018 23  15 - 41 U/L Final  . ALT 01/23/2018 27  14 - 54 U/L Final  . Alkaline Phosphatase 01/23/2018 149* 38 - 126 U/L Final  . Total Bilirubin 01/23/2018 0.8  0.3 - 1.2 mg/dL Final  . GFR calc non Af Amer 01/23/2018 >60  >60 mL/min Final  . GFR calc Af Amer 01/23/2018 >60  >60 mL/min Final   Comment: (NOTE) The eGFR has been calculated using the CKD EPI equation. This calculation has not been validated in all clinical situations. eGFR's persistently <60 mL/min signify possible Chronic Kidney Disease.   . Anion gap 01/23/2018 11  5 - 15 Final     Pathology Orders Placed This Encounter  Procedures  . [place] AXILLARY NODE CORE BIOPSY RIGHT    Core biopsy of right breast lesions noted on mammogram done 11/2017.  Spoke with Dr.  Lovey Newcomer    Standing Status:   Future    Standing Expiration Date:  03/26/2019    Order Specific Question:   Reason for Exam (SYMPTOM  OR DIAGNOSIS REQUIRED)    Answer:   left breast Cancer triple negative.  Prior right breast biopsy in 2014 fibroadenoma.  2 lesions noted on right breast mammogram done 11/2017    Order Specific Question:   Preferred imaging location?    Answer:   [facility]  . US BREAST ASPIRATION LEFT    Left breast cyst  noted on mammogram done 11/2017.  Spoke with Dr. Lovey Newcomer    Standing Status:   Future    Standing Expiration Date:   03/26/2019    Order Specific Question:   Reason for Exam (SYMPTOM  OR DIAGNOSIS REQUIRED)    Answer:   left breast cancer.  left breast cyst noted on mammogram done 11/2017    Order Specific Question:   Preferred imaging location?    Answer:   [facility]    Bilateral diagnostic mammogram performed 12/20/2017:    targeted ultrasound is performed, showing a 6 x 4 x 6 mm probable complicated cyst left breast 2 o'clock position 3 cm from the nipple.  A 7 x 5 x 9 mm oval circumscribed hypoechoic mass right breast 3 o'clock position 1 cm from the nipple.  A 3 x 8 x 8 mm oval circumscribed hypoechoic mass right breast 9 o'clock position 3 cm from the nipple.  IMPRESSION: 1. Stable postlumpectomy changes left breast. 2. Probably benign left breast mass, favored to represent a complicated cyst. 3. Probably benign right breast masses favored to represent fibroadenomas.  RECOMMENDATION: 1. Bilateral diagnostic mammography and bilateral breast ultrasound in 6 months to ensure stability of probably benign bilateral breast masses.   Zoila Shutter MD

## 2018-01-29 ENCOUNTER — Telehealth (HOSPITAL_COMMUNITY): Payer: Self-pay

## 2018-01-29 ENCOUNTER — Other Ambulatory Visit (HOSPITAL_COMMUNITY): Payer: Self-pay | Admitting: Internal Medicine

## 2018-01-29 DIAGNOSIS — C50912 Malignant neoplasm of unspecified site of left female breast: Secondary | ICD-10-CM

## 2018-01-29 NOTE — Telephone Encounter (Signed)
-----   Message from Zoila Shutter, MD sent at 01/25/2018  4:15 PM EST ----- Spoke with dr. Arnoldo Morale and Dr. Rosana Hoes of radiology.  Spoke with pt today. She is recommended for aspiration of left breast cyst and core biopsies of right breast.  This has been ordered and pt agreeable to having procedure done.  Please make sure this is scheduled.  Thanks

## 2018-01-29 NOTE — Telephone Encounter (Signed)
This is scheduled for 02/05/18.Patient is aware.

## 2018-02-05 ENCOUNTER — Inpatient Hospital Stay (HOSPITAL_COMMUNITY): Admission: RE | Admit: 2018-02-05 | Payer: 59 | Source: Ambulatory Visit

## 2018-02-12 ENCOUNTER — Encounter (HOSPITAL_COMMUNITY): Payer: Self-pay

## 2018-02-12 ENCOUNTER — Ambulatory Visit (HOSPITAL_COMMUNITY)
Admission: RE | Admit: 2018-02-12 | Discharge: 2018-02-12 | Disposition: A | Discharge status: Home / Self Care | Payer: 59 | Source: Ambulatory Visit | Attending: Internal Medicine | Admitting: Internal Medicine

## 2018-02-12 ENCOUNTER — Inpatient Hospital Stay (HOSPITAL_COMMUNITY): Admission: RE | Admit: 2018-02-12 | Payer: 59 | Source: Ambulatory Visit

## 2018-02-12 ENCOUNTER — Other Ambulatory Visit (HOSPITAL_COMMUNITY): Payer: 59

## 2018-02-12 ENCOUNTER — Other Ambulatory Visit (HOSPITAL_COMMUNITY): Payer: Self-pay | Admitting: Internal Medicine

## 2018-02-12 DIAGNOSIS — D241 Benign neoplasm of right breast: Secondary | ICD-10-CM | POA: Insufficient documentation

## 2018-02-12 DIAGNOSIS — C50912 Malignant neoplasm of unspecified site of left female breast: Secondary | ICD-10-CM

## 2018-02-12 DIAGNOSIS — R928 Other abnormal and inconclusive findings on diagnostic imaging of breast: Secondary | ICD-10-CM

## 2018-02-12 MED ORDER — LIDOCAINE HCL (PF) 1 % IJ SOLN
INTRAMUSCULAR | Status: AC
  Administered 2018-02-12: 5 mL
  Filled 2018-02-12: qty 5

## 2018-02-12 MED ORDER — LIDOCAINE-EPINEPHRINE (PF) 1 %-1:200000 IJ SOLN
INTRAMUSCULAR | Status: AC
  Administered 2018-02-12: 15 mL
  Filled 2018-02-12: qty 30

## 2018-02-12 MED ORDER — LIDOCAINE HCL (PF) 1 % IJ SOLN
INTRAMUSCULAR | Status: AC
  Administered 2018-02-12: 5 mL
  Filled 2018-02-12: qty 10

## 2018-02-22 ENCOUNTER — Encounter (HOSPITAL_COMMUNITY): Payer: Self-pay | Admitting: Internal Medicine

## 2018-02-22 ENCOUNTER — Inpatient Hospital Stay (HOSPITAL_COMMUNITY): Payer: 59 | Attending: Adult Health | Admitting: Internal Medicine

## 2018-02-22 ENCOUNTER — Ambulatory Visit (HOSPITAL_COMMUNITY): Payer: 59 | Admitting: Internal Medicine

## 2018-02-22 VITALS — BP 164/95 | HR 96 | Temp 97.6°F | Resp 18 | Wt 153.8 lb

## 2018-02-22 DIAGNOSIS — Z9221 Personal history of antineoplastic chemotherapy: Secondary | ICD-10-CM | POA: Diagnosis not present

## 2018-02-22 DIAGNOSIS — Z853 Personal history of malignant neoplasm of breast: Secondary | ICD-10-CM | POA: Diagnosis not present

## 2018-02-22 DIAGNOSIS — I10 Essential (primary) hypertension: Secondary | ICD-10-CM | POA: Diagnosis not present

## 2018-02-22 DIAGNOSIS — Z923 Personal history of irradiation: Secondary | ICD-10-CM | POA: Diagnosis not present

## 2018-02-22 DIAGNOSIS — Z87891 Personal history of nicotine dependence: Secondary | ICD-10-CM | POA: Diagnosis not present

## 2018-02-22 DIAGNOSIS — C50912 Malignant neoplasm of unspecified site of left female breast: Secondary | ICD-10-CM

## 2018-02-22 NOTE — Progress Notes (Signed)
Diagnosis No diagnosis found.  Staging Cancer Staging Invasive ductal carcinoma of left breast ([place]) Staging form: Breast, AJCC 7th Edition - Clinical: Stage IA (T1c, N0, cM0) - Signed by Baird Cancer, PA-C on 01/06/2014   Assessment/Plan:  1.  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  The patient had a mammogram performed on 12/20/2017 that showed:  6 x 4 x 6 mm probable complicated cyst left breast 2 o'clock position 3 cm from the nipple.  A 7 x 5 x 9 mm oval circumscribed hypoechoic mass right breast 3 o'clock position 1 cm from the nipple.  A 3 x 8 x 8 mm oval circumscribed hypoechoic mass right breast 9 o'clock position 3 cm from the nipple.  Previously, I discussed the case with Dr. Arnoldo Morale as well as Dr. Lovey Newcomer of radiology..  The patient has undergone biopsies of the right breast lesion on 02/12/2018 at the 3 and 9:00 positions with pathology returning as fibrocystic changes.  She will be set up for bilateral diagnostic mammogram and bilateral breast ultrasound in 05/2018 for interval follow-up.  She should notify the office if she has any problems prior to her next visit.  2.  Right breast fibrocystic disease.  She had a right breast biopsy performed in October 2014 that returned with fibrocystic changes.  Due to the findings on recent mammogram she underwent a biopsy on 02/12/2018  of the areas at the 3 and 9 o'clock position of the right breast which showed fibrocystic changes.  She is set up for repeat imaging in June 2019.  3.  Hypertension.  Blood pressure is 164/95. Follow with primary care physician as recommended.   Interval history: Stage IA (T1CN0M0) invasive ductal carcinoma of left breast, triple negative; S/P left breast lumpectomy by Dr. Arnoldo Morale on 11/12/2013 followed by systemic chemotherapy consisting of TAC x 6 cycles (Adriamycin was discontinued following cycle #3 due to decrease in LVEF) from 12/31/2013- 04/20/2014,  followed by XRT from 05/12/2014- 07/03/2014.  Bilateral diagnostic mammogram done 11/28/2016 was negative.  She had a right breast biopsy in October 2014 that showed fibrocystic changes in the right breast.  Current status :  patient is seen today for follow-up.  She is here to go over recent biopsy.  She reports no problems since the procedure.       Invasive ductal carcinoma of left breast ([place])   09/24/2013 Initial Diagnosis    Invasive ductal carcinoma of left breast      11/12/2013 Surgery    Left breast lumpectomy by Dr. Arnoldo Morale- High grade invasive ductal carcinoma less than 0.1 cm from posterior and medial margins, 0/1 lymph nodes, triple negative      12/31/2013 - 04/20/2014 Chemotherapy    TAC every 21 days.  Adriamycin dropped following cycle 3 for a decreased EF compared to baseline.      05/12/2014 - 07/03/2014 Radiation Therapy    XRT       Problem List Patient Active Problem List   Diagnosis Date Noted  . Tobacco abuse [Z72.0] 10/21/2014  . 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 Vasomotor instability [R55] 02/16/2014  . Invasive ductal carcinoma of left breast ([place]) [C50.912] 12/11/2013  . HTN (hypertension) [I10] 11/12/2011  . GANGLION-HAND/WRIST [M67.40] 03/04/2008    Past Medical History Past Medical History:  Diagnosis Date  . Cancer ([place]) 11/12/2013   left breast cancer  . 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  Outpatient MUGA scan  performed on 02/25/2014 showed a drop in ejection fraction from 72% to 59% and for that reason doxorubicin was discontinued from her chemotherapy regimen starting on cycle 4.   . Chronic sinusitis   . Diabetes mellitus without complication (HCC)    diet controlled  . Hives 05/2014  . Hypertension   . Invasive ductal carcinoma of left breast ([place]) 12/11/2013  . Personal history of chemotherapy   . Personal history of radiation therapy   . Tobacco abuse  10/21/2014    Past Surgical History Past Surgical History:  Procedure Laterality Date  . ABDOMINAL HYSTERECTOMY    . COLONOSCOPY  01/09/2012   Procedure: COLONOSCOPY;  Surgeon: Dorothyann Peng, MD;  [facility];  Service: Endoscopy;  Laterality: N/A;  1:30 pm  . DILATION AND CURETTAGE OF UTERUS    . ECTOPIC PREGNANCY SURGERY    . GANGLION CYST EXCISION    . INGUINAL HERNIA REPAIR Left   . left leg surgery     17 yrs of age  . PARTIAL MASTECTOMY WITH NEEDLE LOCALIZATION AND AXILLARY SENTINEL LYMPH NODE BX Left 11/12/2013   Procedure: PARTIAL MASTECTOMY WITH NEEDLE LOCALIZATION AND AXILLARY SENTINEL LYMPH NODE BX;  Surgeon: Jamesetta So, MD;  Location: [facility];  Service: General;  Laterality: Left;  . PORT-A-CATH REMOVAL Right 12/20/2016   Procedure: MINOR REMOVAL PORT-A-CATH;  Surgeon: Aviva Signs, MD;  Location: [facility];  Service: General;  Laterality: Right;  . PORTACATH PLACEMENT Right 12/29/2013   Procedure: INSERTION PORT-A-CATH;  Surgeon: Jamesetta So, MD;  Location: [facility];  Service: General;  Laterality: Right;    Family History Family History  Problem Relation Age of Onset  . Diabetes type II Mother   . Diabetes type II Father   . Prostate cancer Father      Social History  reports that she has quit smoking. Her smoking use included cigarettes. She has a 3.75 pack-year smoking history. she has never used smokeless tobacco. She reports that she drinks alcohol. She reports that she does not use drugs.  Medications  Current Outpatient Medications:  .  amLODipine (NORVASC) 5 MG tablet, , Disp: , Rfl:  .  atorvastatin (LIPITOR) 10 MG tablet, , Disp: , Rfl:  .  fluticasone (FLONASE) 50 MCG/ACT nasal spray, Place 2 sprays into both nostrils daily., Disp: 16 g, Rfl: 12 .  lisinopril-hydrochlorothiazide (PRINZIDE,ZESTORETIC) 20-12.5 MG tablet, , Disp: , Rfl:  .  loratadine (CLARITIN) 10 MG tablet, Take 1 tablet (10 mg total) by mouth daily as needed., Disp: 100  tablet, Rfl: 3 .  metFORMIN (GLUCOPHAGE) 850 MG tablet, , Disp: , Rfl:  .  triamterene-hydrochlorothiazide (DYAZIDE) 37.5-25 MG per capsule, Take 1 capsule by mouth daily. , Disp: , Rfl:   Allergies Other and Shrimp [shellfish allergy]  Review of Systems Review of Systems - Oncology ROS as per HPI otherwise 12 point ROS is negative.   Physical Exam  Vitals temperature 97.6 heart rate 96 respirations 18 blood pressure 164/95 pulse ox 97% on room air. Wt Readings from Last 3 Encounters:  01/23/18 153 lb (69.4 kg)  07/17/17 156 lb (70.8 kg)  01/16/17 159 lb 14.4 oz (72.5 kg)   Temp Readings from Last 3 Encounters:  02/12/18 98.2 F (36.8 C) (Oral)  01/16/17 98.2 F (36.8 C) (Oral)   BP Readings from Last 3 Encounters:  02/12/18 (!) 158/98  01/23/18 124/87  07/17/17 (!) 136/92   Pulse Readings from Last 3 Encounters:  02/12/18 98  01/23/18 82  07/17/17 89   Constitutional: Well-developed, well-nourished, and in no distress.   HENT: Head: Normocephalic and atraumatic.  Mouth/Throat: No oropharyngeal exudate. Mucosa moist. Eyes: Pupils are equal, round, and reactive to light. Conjunctivae are normal. No scleral icterus.  Neck: Normal range of motion. Neck supple. No JVD present.  Cardiovascular: Normal rate, regular rhythm and normal heart sounds.  Exam reveals no gallop and no friction rub.   No murmur heard. Pulmonary/Chest: Effort normal and breath sounds normal. No respiratory distress. No wheezes.No rales.  Abdominal: Soft. Bowel sounds are normal. No distension. There is no tenderness. There is no guarding.  Musculoskeletal: No edema or tenderness.  Lymphadenopathy: No cervical, axillary or supraclavicular adenopathy.  Neurological: Alert and oriented to person, place, and time. No cranial nerve deficit.  Skin: Skin is warm and dry. No rash noted. No erythema. No pallor.  Psychiatric: Affect and judgment normal.  Bilateral Breast exam.  No dominant masses palpable  bilaterally.  She has some minor bruising noted on the right breast from recent biopsy.  Labs No visits with results within 3 Day(s) from this visit.  Latest known visit with results is:  Appointment on 01/23/2018  Component Date Value Ref Range Status  . WBC 01/23/2018 6.3  4.0 - 10.5 K/uL Final  . RBC 01/23/2018 4.54  3.87 - 5.11 MIL/uL Final  . Hemoglobin 01/23/2018 13.4  12.0 - 15.0 g/dL Final  . HCT 01/23/2018 40.9  36.0 - 46.0 % Final  . MCV 01/23/2018 90.1  78.0 - 100.0 fL Final  . MCH 01/23/2018 29.5  26.0 - 34.0 pg Final  . MCHC 01/23/2018 32.8  30.0 - 36.0 g/dL Final  . RDW 01/23/2018 13.0  11.5 - 15.5 % Final  . Platelets 01/23/2018 177  150 - 400 K/uL Final  . Neutrophils Relative % 01/23/2018 64  % Final  . Neutro Abs 01/23/2018 4.0  1.7 - 7.7 K/uL Final  . Lymphocytes Relative 01/23/2018 27  % Final  . Lymphs Abs 01/23/2018 1.7  0.7 - 4.0 K/uL Final  . Monocytes Relative 01/23/2018 6  % Final  . Monocytes Absolute 01/23/2018 0.4  0.1 - 1.0 K/uL Final  . Eosinophils Relative 01/23/2018 3  % Final  . Eosinophils Absolute 01/23/2018 0.2  0.0 - 0.7 K/uL Final  . Basophils Relative 01/23/2018 0  % Final  . Basophils Absolute 01/23/2018 0.0  0.0 - 0.1 K/uL Final  . Sodium 01/23/2018 138  135 - 145 mmol/L Final  . Potassium 01/23/2018 3.4* 3.5 - 5.1 mmol/L Final  . Chloride 01/23/2018 100* 101 - 111 mmol/L Final  . CO2 01/23/2018 27  22 - 32 mmol/L Final  . Glucose, Bld 01/23/2018 135* 65 - 99 mg/dL Final  . BUN 01/23/2018 19  6 - 20 mg/dL Final  . Creatinine, Ser 01/23/2018 0.97  0.44 - 1.00 mg/dL Final  . Calcium 01/23/2018 9.5  8.9 - 10.3 mg/dL Final  . Total Protein 01/23/2018 7.1  6.5 - 8.1 g/dL Final  . Albumin 01/23/2018 4.1  3.5 - 5.0 g/dL Final  . AST 01/23/2018 23  15 - 41 U/L Final  . ALT 01/23/2018 27  14 - 54 U/L Final  . Alkaline Phosphatase 01/23/2018 149* 38 - 126 U/L Final  . Total Bilirubin 01/23/2018 0.8  0.3 - 1.2 mg/dL Final  . GFR calc non Af Amer  01/23/2018 >60  >60 mL/min Final  . GFR calc Af Amer 01/23/2018 >60  >60 mL/min Final   Comment: (NOTE) The eGFR  has been calculated using the CKD EPI equation. This calculation has not been validated in all clinical situations. eGFR's persistently <60 mL/min signify possible Chronic Kidney Disease.   . Anion gap 01/23/2018 11  5 - 15 Final     Bilateral diagnostic mammogram performed 12/20/2017:    targeted ultrasound is performed, showing a 6 x 4 x 6 mm probable complicated cyst left breast 2 o'clock position 3 cm from the nipple.  A 7 x 5 x 9 mm oval circumscribed hypoechoic mass right breast 3 o'clock position 1 cm from the nipple.  A 3 x 8 x 8 mm oval circumscribed hypoechoic mass right breast 9 o'clock position 3 cm from the nipple.  IMPRESSION: 1. Stable postlumpectomy changes left breast. 2. Probably benign left breast mass, favored to represent a complicated cyst. 3. Probably benign right breast masses favored to represent fibroadenomas.  RECOMMENDATION: 1. Bilateral diagnostic mammography and bilateral breast ultrasound in 6 months to ensure stability of probably benign bilateral breast masses.    Zoila Shutter MD

## 2018-02-22 NOTE — Patient Instructions (Addendum)
Sally [name] at [facility] Discharge Instructions  You were seen today by Dr. Zoila Shutter Your recent biopsy results were negative for any cancer We do still want you to get your Mammogram in June Follow up here a few days after to review results.   Thank you for choosing [place] at [facility] to provide your oncology and hematology care.  To afford each patient quality time with our provider, please arrive at least 15 minutes before your scheduled appointment time.   If you have a lab appointment with the [facility] please come in thru the  Main Entrance and check in at the main information desk  You need to re-schedule your appointment should you arrive 10 or more minutes late.  We strive to give you quality time with our providers, and arriving late affects you and other patients whose appointments are after yours.  Also, if you no show three or more times for appointments you may be dismissed from the clinic at the providers discretion.     Again, thank you for choosing [facility].  Our hope is that these requests will decrease the amount of time that you wait before being seen by our physicians.       _____________________________________________________________  Should you have questions after your visit to [facility], please contact our office at [phone] between the hours of 8:30 a.m. and 4:30 p.m.  Voicemails left after 4:30 p.m. will not be returned until the following business day.  For prescription refill requests, have your pharmacy contact our office.       Resources For Cancer Patients and their Caregivers ? American Cancer Society: Can assist with transportation, wigs, general needs, runs Look Good Feel Better.        631-281-1733 ? Cancer Care: Provides financial assistance, online support groups, medication/co-pay assistance.  1-800-813-HOPE (914)661-4724) ? [place] Assists [place] cancer patients and their families through emotional , educational and financial support.  260-264-4731 ? [place] DSS Where to apply for food stamps, Medicaid and utility assistance. (418) 208-3176 ? [facility]: Transportation to medical appointments. 256-145-8361 ? Social Security Administration: May apply for disability if have a Stage IV cancer. (813)377-9672 (848)881-7040 ? [employer], Disability and Transit Services: Assists with nutrition, care and transit needs. [place] Support Programs:   > Cancer Support Group  2nd Tuesday of the month 1pm-2pm, Journey Room   > Creative Journey  3rd Tuesday of the month 1130am-1pm, Journey Room

## 2018-06-25 ENCOUNTER — Encounter (HOSPITAL_COMMUNITY): Payer: 59

## 2018-07-02 ENCOUNTER — Ambulatory Visit (HOSPITAL_COMMUNITY): Payer: 59 | Admitting: Hematology

## 2018-12-24 ENCOUNTER — Encounter (HOSPITAL_COMMUNITY): Payer: 59

## 2018-12-24 ENCOUNTER — Encounter (HOSPITAL_COMMUNITY): Payer: Self-pay

## 2018-12-24 ENCOUNTER — Ambulatory Visit (HOSPITAL_COMMUNITY)
Admission: RE | Admit: 2018-12-24 | Discharge: 2018-12-24 | Disposition: A | Discharge status: Home / Self Care | Payer: 59 | Source: Ambulatory Visit | Attending: Internal Medicine | Admitting: Internal Medicine

## 2018-12-24 ENCOUNTER — Ambulatory Visit (HOSPITAL_COMMUNITY): Admission: RE | Admit: 2018-12-24 | Payer: 59 | Source: Ambulatory Visit

## 2018-12-24 DIAGNOSIS — C50912 Malignant neoplasm of unspecified site of left female breast: Secondary | ICD-10-CM | POA: Diagnosis present

## 2018-12-31 ENCOUNTER — Inpatient Hospital Stay (HOSPITAL_COMMUNITY): Payer: 59 | Attending: Internal Medicine | Admitting: Internal Medicine

## 2018-12-31 ENCOUNTER — Other Ambulatory Visit: Payer: Self-pay

## 2018-12-31 ENCOUNTER — Inpatient Hospital Stay (HOSPITAL_COMMUNITY): Payer: 59

## 2018-12-31 ENCOUNTER — Encounter (HOSPITAL_COMMUNITY): Payer: Self-pay | Admitting: Internal Medicine

## 2018-12-31 VITALS — BP 164/95 | HR 96 | Temp 98.3°F | Resp 18 | Wt 148.5 lb

## 2018-12-31 DIAGNOSIS — I10 Essential (primary) hypertension: Secondary | ICD-10-CM | POA: Insufficient documentation

## 2018-12-31 DIAGNOSIS — E119 Type 2 diabetes mellitus without complications: Secondary | ICD-10-CM | POA: Diagnosis not present

## 2018-12-31 DIAGNOSIS — Z79899 Other long term (current) drug therapy: Secondary | ICD-10-CM | POA: Diagnosis not present

## 2018-12-31 DIAGNOSIS — F17200 Nicotine dependence, unspecified, uncomplicated: Secondary | ICD-10-CM

## 2018-12-31 DIAGNOSIS — Z7984 Long term (current) use of oral hypoglycemic drugs: Secondary | ICD-10-CM | POA: Diagnosis not present

## 2018-12-31 DIAGNOSIS — Z853 Personal history of malignant neoplasm of breast: Secondary | ICD-10-CM | POA: Diagnosis present

## 2018-12-31 DIAGNOSIS — Z9221 Personal history of antineoplastic chemotherapy: Secondary | ICD-10-CM

## 2018-12-31 DIAGNOSIS — Z171 Estrogen receptor negative status [ER-]: Secondary | ICD-10-CM | POA: Insufficient documentation

## 2018-12-31 DIAGNOSIS — R0602 Shortness of breath: Secondary | ICD-10-CM

## 2018-12-31 DIAGNOSIS — Z923 Personal history of irradiation: Secondary | ICD-10-CM | POA: Diagnosis not present

## 2018-12-31 DIAGNOSIS — C50912 Malignant neoplasm of unspecified site of left female breast: Secondary | ICD-10-CM

## 2018-12-31 DIAGNOSIS — N6011 Diffuse cystic mastopathy of right breast: Secondary | ICD-10-CM | POA: Diagnosis not present

## 2018-12-31 DIAGNOSIS — Z720 Tobacco use: Secondary | ICD-10-CM

## 2018-12-31 LAB — CBC WITH DIFFERENTIAL/PLATELET
ABS IMMATURE GRANULOCYTES: 0.02 10*3/uL (ref 0.00–0.07)
BASOS ABS: 0 10*3/uL (ref 0.0–0.1)
BASOS PCT: 1 %
EOS ABS: 0.1 10*3/uL (ref 0.0–0.5)
Eosinophils Relative: 3 %
HEMATOCRIT: 41.7 % (ref 36.0–46.0)
Hemoglobin: 13.4 g/dL (ref 12.0–15.0)
IMMATURE GRANULOCYTES: 0 %
LYMPHS ABS: 1.2 10*3/uL (ref 0.7–4.0)
Lymphocytes Relative: 25 %
MCH: 29.8 pg (ref 26.0–34.0)
MCHC: 32.1 g/dL (ref 30.0–36.0)
MCV: 92.9 fL (ref 80.0–100.0)
MONOS PCT: 7 %
Monocytes Absolute: 0.4 10*3/uL (ref 0.1–1.0)
NEUTROS PCT: 64 %
NRBC: 0 % (ref 0.0–0.2)
Neutro Abs: 3.2 10*3/uL (ref 1.7–7.7)
PLATELETS: 193 10*3/uL (ref 150–400)
RBC: 4.49 MIL/uL (ref 3.87–5.11)
RDW: 13.2 % (ref 11.5–15.5)
WBC: 5 10*3/uL (ref 4.0–10.5)

## 2018-12-31 LAB — COMPREHENSIVE METABOLIC PANEL
ALBUMIN: 4.3 g/dL (ref 3.5–5.0)
ALT: 30 U/L (ref 0–44)
ANION GAP: 8 (ref 5–15)
AST: 23 U/L (ref 15–41)
Alkaline Phosphatase: 144 U/L — ABNORMAL HIGH (ref 38–126)
BILIRUBIN TOTAL: 0.7 mg/dL (ref 0.3–1.2)
BUN: 17 mg/dL (ref 6–20)
CALCIUM: 9.5 mg/dL (ref 8.9–10.3)
CO2: 26 mmol/L (ref 22–32)
Chloride: 103 mmol/L (ref 98–111)
Creatinine, Ser: 0.98 mg/dL (ref 0.44–1.00)
GFR calc non Af Amer: >60 mL/min (ref >60)
Glucose, Bld: 198 mg/dL — ABNORMAL HIGH (ref 70–99)
Potassium: 4.3 mmol/L (ref 3.5–5.1)
Sodium: 137 mmol/L (ref 135–145)
TOTAL PROTEIN: 7.4 g/dL (ref 6.5–8.1)

## 2018-12-31 LAB — LACTATE DEHYDROGENASE: LDH: 130 U/L (ref 98–192)

## 2018-12-31 NOTE — Progress Notes (Signed)
Diagnosis Invasive ductal carcinoma of left breast ([place]) - Plan: CBC with Differential/Platelet, Comprehensive metabolic panel, Lactate dehydrogenase  Shortness of breath - Plan: CBC with Differential/Platelet, Comprehensive metabolic panel, Lactate dehydrogenase, CT CHEST W CONTRAST  Tobacco use - Plan: CBC with Differential/Platelet, Comprehensive metabolic panel, Lactate dehydrogenase, CT CHEST W CONTRAST  Staging Cancer Staging Invasive ductal carcinoma of left breast ([place]) Staging form: Breast, AJCC 7th Edition - Clinical: Stage IA (T1c, N0, cM0) - Signed by Baird Cancer, PA-C on 01/06/2014   Assessment and Plan:  1.  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  The patient had a mammogram performed on 12/20/2017 that showed:  6 x 4 x 6 mm probable complicated cyst left breast 2 o'clock position 3 cm from the nipple.  A 7 x 5 x 9 mm oval circumscribed hypoechoic mass right breast 3 o'clock position 1 cm from the nipple.  A 3 x 8 x 8 mm oval circumscribed hypoechoic mass right breast 9 o'clock position 3 cm from the nipple.  Previously, I discussed the case with Dr. Arnoldo Morale as well as Dr. Lovey Newcomer of radiology..  The patient has undergone biopsies of the right breast lesion on 02/12/2018 at the 3 and 9:00 positions with pathology returning as fibrocystic changes.    Bilateral diagnostic mammogram done 12/24/2018 reviewed and showed no mammographic evidence of malignancy.  Pt is set up for bilateral diagnostic mammogram in 11/2019.  She will follow-up at that time to go over results.  Pt likely may be seen yearly after 11/2019 follow-up.   2.  Right breast fibrocystic disease.  She had a right breast biopsy performed in October 2014 that returned with fibrocystic changes.  Due to the findings on recent mammogram she underwent a biopsy on 02/12/2018  of the areas at the 3 and 9 o'clock position of the right breast which showed fibrocystic  changes.  Bilateral diagnostic mammogram done 12/24/2018 was negative.  She is set up for bilateral diagnostic mammogram in 11/2019.    3.  Smoking/SOB.  Cessation is recommended.  Pt has 30 year history of smoking.  Last CXR was done 11/06/2013 and showed IMPRESSION: 1. Bilateral hilar and infrahilar fullness. Although this may be vascular to exclude a mass lesion or adenopathy, chest CT suggested. 2. Basilar atelectasis with possible infiltrates. Bronchiectasis particularly in the right lung base cannot be excluded. These findings can also be further evaluated with chest CT.  She is set up for CT chest for further evaluation as review of chart does not show evidence of CT evaluation.  She will be notified of results.    4.  Hypertension.  Blood pressure is 164/95. Follow with primary care physician as recommended.  25 minutes spent with more than 50% spent in counseling and coordination of care.    Interval history: Stage IA (T1CN0M0) invasive ductal carcinoma of left breast, triple negative; S/P left breast lumpectomy by Dr. Arnoldo Morale on 11/12/2013 followed by systemic chemotherapy consisting of TAC x 6 cycles (Adriamycin was discontinued following cycle #3 due to decrease in LVEF) from 12/31/2013- 04/20/2014, followed by XRT from 05/12/2014- 07/03/2014.  Bilateral diagnostic mammogram done 11/28/2016 was negative.  She had a right breast biopsy in October 2014 that showed fibrocystic changes in the right breast.  Current status :  Patient is seen today for follow-up.  She is here to go over recent mammogram.  Pt continues to smoke. She reports shortness of breath.      Invasive  ductal carcinoma of left breast ([place])   09/24/2013 Initial Diagnosis    Invasive ductal carcinoma of left breast    11/12/2013 Surgery    Left breast lumpectomy by Dr. Arnoldo Morale- High grade invasive ductal carcinoma less than 0.1 cm from posterior and medial margins, 0/1 lymph nodes, triple negative    12/31/2013 -  04/20/2014 Chemotherapy    TAC every 21 days.  Adriamycin dropped following cycle 3 for a decreased EF compared to baseline.    05/12/2014 - 07/03/2014 Radiation Therapy    XRT      Problem List Patient Active Problem List   Diagnosis Date Noted  . Tobacco abuse [Z72.0] 10/21/2014  . 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 Vasomotor instability [R55] 02/16/2014  . Invasive ductal carcinoma of left breast ([place]) [C50.912] 12/11/2013  . HTN (hypertension) [I10] 11/12/2011  . GANGLION-HAND/WRIST [M67.40] 03/04/2008    Past Medical History Past Medical History:  Diagnosis Date  . Cancer ([place]) 11/12/2013   left breast cancer  . 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  Outpatient MUGA scan performed on 02/25/2014 showed a drop in ejection fraction from 72% to 59% and for that reason doxorubicin was discontinued from her chemotherapy regimen starting on cycle 4.   . Chronic sinusitis   . Diabetes mellitus without complication (HCC)    diet controlled  . Hives 05/2014  . Hypertension   . Invasive ductal carcinoma of left breast ([place]) 12/11/2013  . Personal history of chemotherapy   . Personal history of radiation therapy   . Tobacco abuse 10/21/2014    Past Surgical History Past Surgical History:  Procedure Laterality Date  . ABDOMINAL HYSTERECTOMY    . COLONOSCOPY  01/09/2012   Procedure: COLONOSCOPY;  Surgeon: Dorothyann Peng, MD;  Location: [facility] ENDO SUITE;  Service: Endoscopy;  Laterality: N/A;  1:30 pm  . DILATION AND CURETTAGE OF UTERUS    . ECTOPIC PREGNANCY SURGERY    . GANGLION CYST EXCISION    . INGUINAL HERNIA REPAIR Left   . left leg surgery     17 yrs of age  . PARTIAL MASTECTOMY WITH NEEDLE LOCALIZATION AND AXILLARY SENTINEL LYMPH NODE BX Left 11/12/2013   Procedure: PARTIAL MASTECTOMY WITH NEEDLE LOCALIZATION AND AXILLARY SENTINEL LYMPH NODE BX;  Surgeon: Jamesetta So, MD;  Location: [facility] ORS;  Service:  General;  Laterality: Left;  . PORT-A-CATH REMOVAL Right 12/20/2016   Procedure: MINOR REMOVAL PORT-A-CATH;  Surgeon: Aviva Signs, MD;  Location: [facility] ORS;  Service: General;  Laterality: Right;  . PORTACATH PLACEMENT Right 12/29/2013   Procedure: INSERTION PORT-A-CATH;  Surgeon: Jamesetta So, MD;  Location: [facility] ORS;  Service: General;  Laterality: Right;    Family History Family History  Problem Relation Age of Onset  . Diabetes type II Mother   . Diabetes type II Father   . Prostate cancer Father      Social History  reports that she has quit smoking. Her smoking use included cigarettes. She has a 3.75 pack-year smoking history. She has never used smokeless tobacco. She reports current alcohol use. She reports that she does not use drugs.  Medications  Current Outpatient Medications:  .  amLODipine (NORVASC) 5 MG tablet, , Disp: , Rfl:  .  atorvastatin (LIPITOR) 10 MG tablet, , Disp: , Rfl:  .  lisinopril-hydrochlorothiazide (PRINZIDE,ZESTORETIC) 20-12.5 MG tablet, , Disp: , Rfl:  .  metFORMIN (GLUCOPHAGE) 850 MG tablet, , Disp: , Rfl:   Allergies Other and  Shrimp [shellfish allergy]  Review of Systems Review of Systems - Oncology ROS negative other than SOB   Physical Exam  Vitals Wt Readings from Last 3 Encounters:  12/31/18 148 lb 8 oz (67.4 kg)  02/22/18 153 lb 12.8 oz (69.8 kg)  01/23/18 153 lb (69.4 kg)   Temp Readings from Last 3 Encounters:  12/31/18 98.3 F (36.8 C) (Oral)  02/22/18 97.6 F (36.4 C) (Oral)  02/12/18 98.2 F (36.8 C) (Oral)   BP Readings from Last 3 Encounters:  12/31/18 (!) 164/95  02/22/18 (!) 164/95  02/12/18 (!) 158/98   Pulse Readings from Last 3 Encounters:  12/31/18 96  02/22/18 96  02/12/18 98   Constitutional: Well-developed, well-nourished, and in no distress.   HENT: Head: Normocephalic and atraumatic.  Mouth/Throat: No oropharyngeal exudate. Mucosa moist. Eyes: Pupils are equal, round, and reactive to light.  Conjunctivae are normal. No scleral icterus.  Neck: Normal range of motion. Neck supple. No JVD present.  Cardiovascular: Normal rate, regular rhythm and normal heart sounds.  Exam reveals no gallop and no friction rub.   No murmur heard. Pulmonary/Chest: Effort normal and breath sounds normal. No respiratory distress. No wheezes.No rales.  Abdominal: Soft. Bowel sounds are normal. No distension. There is no tenderness. There is no guarding.  Musculoskeletal: No edema or tenderness.  Lymphadenopathy: No cervical, axillary or supraclavicular adenopathy.  Neurological: Alert and oriented to person, place, and time. No cranial nerve deficit.  Skin: Skin is warm and dry. No rash noted. No erythema. No pallor.  Psychiatric: Affect and judgment normal.  Breast exam:  Chaperone present.  Evidence of prior left breast surgery.  No dominant masses palpable bilaterally.    Labs No visits with results within 3 Day(s) from this visit.  Latest known visit with results is:  Appointment on 01/23/2018  Component Date Value Ref Range Status  . WBC 01/23/2018 6.3  4.0 - 10.5 K/uL Final  . RBC 01/23/2018 4.54  3.87 - 5.11 MIL/uL Final  . Hemoglobin 01/23/2018 13.4  12.0 - 15.0 g/dL Final  . HCT 01/23/2018 40.9  36.0 - 46.0 % Final  . MCV 01/23/2018 90.1  78.0 - 100.0 fL Final  . MCH 01/23/2018 29.5  26.0 - 34.0 pg Final  . MCHC 01/23/2018 32.8  30.0 - 36.0 g/dL Final  . RDW 01/23/2018 13.0  11.5 - 15.5 % Final  . Platelets 01/23/2018 177  150 - 400 K/uL Final  . Neutrophils Relative % 01/23/2018 64  % Final  . Neutro Abs 01/23/2018 4.0  1.7 - 7.7 K/uL Final  . Lymphocytes Relative 01/23/2018 27  % Final  . Lymphs Abs 01/23/2018 1.7  0.7 - 4.0 K/uL Final  . Monocytes Relative 01/23/2018 6  % Final  . Monocytes Absolute 01/23/2018 0.4  0.1 - 1.0 K/uL Final  . Eosinophils Relative 01/23/2018 3  % Final  . Eosinophils Absolute 01/23/2018 0.2  0.0 - 0.7 K/uL Final  . Basophils Relative 01/23/2018 0  %  Final  . Basophils Absolute 01/23/2018 0.0  0.0 - 0.1 K/uL Final  . Sodium 01/23/2018 138  135 - 145 mmol/L Final  . Potassium 01/23/2018 3.4* 3.5 - 5.1 mmol/L Final  . Chloride 01/23/2018 100* 101 - 111 mmol/L Final  . CO2 01/23/2018 27  22 - 32 mmol/L Final  . Glucose, Bld 01/23/2018 135* 65 - 99 mg/dL Final  . BUN 01/23/2018 19  6 - 20 mg/dL Final  . Creatinine, Ser 01/23/2018 0.97  0.44 - 1.00  mg/dL Final  . Calcium 01/23/2018 9.5  8.9 - 10.3 mg/dL Final  . Total Protein 01/23/2018 7.1  6.5 - 8.1 g/dL Final  . Albumin 01/23/2018 4.1  3.5 - 5.0 g/dL Final  . AST 01/23/2018 23  15 - 41 U/L Final  . ALT 01/23/2018 27  14 - 54 U/L Final  . Alkaline Phosphatase 01/23/2018 149* 38 - 126 U/L Final  . Total Bilirubin 01/23/2018 0.8  0.3 - 1.2 mg/dL Final  . GFR calc non Af Amer 01/23/2018 >60  >60 mL/min Final  . GFR calc Af Amer 01/23/2018 >60  >60 mL/min Final   Comment: (NOTE) The eGFR has been calculated using the CKD EPI equation. This calculation has not been validated in all clinical situations. eGFR's persistently <60 mL/min signify possible Chronic Kidney Disease.   . Anion gap 01/23/2018 11  5 - 15 Final     Pathology Orders Placed This Encounter  Procedures  . CT CHEST W CONTRAST    Standing Status:   Future    Standing Expiration Date:   12/31/2019    Order Specific Question:   If indicated for the ordered procedure, I authorize the administration of contrast media per Radiology protocol    Answer:   Yes    Order Specific Question:   Preferred imaging location?    Answer:   [facility]    Order Specific Question:   Radiology Contrast Protocol - do NOT remove file path    Answer:   [email or web address]  . CBC with Differential/Platelet    Standing Status:   Future    Number of Occurrences:   1    Standing Expiration Date:   01/01/2020  . Comprehensive metabolic panel    Standing Status:   Future    Number of Occurrences:   1    Standing  Expiration Date:   01/01/2020  . Lactate dehydrogenase    Standing Status:   Future    Number of Occurrences:   1    Standing Expiration Date:   01/01/2020       Zoila Shutter MD

## 2019-01-13 ENCOUNTER — Ambulatory Visit (HOSPITAL_COMMUNITY): Payer: 59

## 2019-02-10 ENCOUNTER — Ambulatory Visit (HOSPITAL_COMMUNITY)
Admission: RE | Admit: 2019-02-10 | Discharge: 2019-02-10 | Disposition: A | Discharge status: Home / Self Care | Payer: 59 | Source: Ambulatory Visit | Attending: Internal Medicine | Admitting: Internal Medicine

## 2019-02-10 DIAGNOSIS — Z720 Tobacco use: Secondary | ICD-10-CM | POA: Insufficient documentation

## 2019-02-10 DIAGNOSIS — R0602 Shortness of breath: Secondary | ICD-10-CM | POA: Diagnosis not present

## 2019-02-10 MED ORDER — IOHEXOL 300 MG/ML  SOLN
75.0000 mL | Freq: Once | INTRAMUSCULAR | Status: AC | PRN
  Administered 2019-02-10: 75 mL via INTRAVENOUS

## 2019-02-17 ENCOUNTER — Encounter (HOSPITAL_COMMUNITY): Payer: Self-pay | Admitting: Internal Medicine

## 2019-02-17 ENCOUNTER — Other Ambulatory Visit: Payer: Self-pay

## 2019-02-17 ENCOUNTER — Inpatient Hospital Stay (HOSPITAL_COMMUNITY): Payer: 59 | Attending: Internal Medicine | Admitting: Internal Medicine

## 2019-02-17 VITALS — BP 121/79 | HR 108 | Temp 97.6°F | Resp 18 | Wt 148.1 lb

## 2019-02-17 DIAGNOSIS — I10 Essential (primary) hypertension: Secondary | ICD-10-CM | POA: Diagnosis not present

## 2019-02-17 DIAGNOSIS — Z171 Estrogen receptor negative status [ER-]: Secondary | ICD-10-CM | POA: Insufficient documentation

## 2019-02-17 DIAGNOSIS — C50912 Malignant neoplasm of unspecified site of left female breast: Secondary | ICD-10-CM

## 2019-02-17 DIAGNOSIS — E119 Type 2 diabetes mellitus without complications: Secondary | ICD-10-CM

## 2019-02-17 DIAGNOSIS — Z853 Personal history of malignant neoplasm of breast: Secondary | ICD-10-CM | POA: Diagnosis present

## 2019-02-17 DIAGNOSIS — Z170 Estrogen receptor positive status [ER+]: Secondary | ICD-10-CM | POA: Diagnosis not present

## 2019-02-17 DIAGNOSIS — Z87891 Personal history of nicotine dependence: Secondary | ICD-10-CM

## 2019-02-17 DIAGNOSIS — Z9221 Personal history of antineoplastic chemotherapy: Secondary | ICD-10-CM

## 2019-02-17 DIAGNOSIS — R590 Localized enlarged lymph nodes: Secondary | ICD-10-CM | POA: Diagnosis not present

## 2019-02-17 DIAGNOSIS — Z923 Personal history of irradiation: Secondary | ICD-10-CM

## 2019-02-17 DIAGNOSIS — Z7984 Long term (current) use of oral hypoglycemic drugs: Secondary | ICD-10-CM

## 2019-02-17 DIAGNOSIS — K769 Liver disease, unspecified: Secondary | ICD-10-CM

## 2019-02-17 DIAGNOSIS — Z79899 Other long term (current) drug therapy: Secondary | ICD-10-CM | POA: Diagnosis not present

## 2019-02-17 NOTE — Progress Notes (Signed)
Diagnosis Invasive ductal carcinoma of left breast ([place]) - Plan: MM Digital Diagnostic Bilat, CBC with Differential, Comprehensive metabolic panel, Lactate dehydrogenase  Liver disease - Plan: MR LIVER W WO CONTRAST  Staging Cancer Staging Invasive ductal carcinoma of left breast ([place]) Staging form: Breast, AJCC 7th Edition - Clinical: Stage IA (T1c, N0, cM0) - Signed by Baird Cancer, PA-C on 01/06/2014   Assessment and Plan:  1.  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  The patient had a mammogram performed on 12/20/2017 that showed:  6 x 4 x 6 mm probable complicated cyst left breast 2 o'clock position 3 cm from the nipple.  A 7 x 5 x 9 mm oval circumscribed hypoechoic mass right breast 3 o'clock position 1 cm from the nipple.  A 3 x 8 x 8 mm oval circumscribed hypoechoic mass right breast 9 o'clock position 3 cm from the nipple.  Previously, I discussed the case with Dr. Arnoldo Morale as well as Dr. Lovey Newcomer of radiology..  The patient has undergone biopsies of the right breast lesion on 02/12/2018 at the 3 and 9:00 positions with pathology returning as fibrocystic changes.    Bilateral diagnostic mammogram done 12/24/2018 reviewed and showed no mammographic evidence of malignancy.  Pt is set up for bilateral diagnostic mammogram in 11/2019.  She will follow-up at that time to go over results.  Pt likely may be seen yearly after 11/2019 follow-up.   2.  Right breast fibrocystic disease.  She had a right breast biopsy performed in October 2014 that returned with fibrocystic changes.  Due to the findings on recent mammogram she underwent a biopsy on 02/12/2018  of the areas at the 3 and 9 o'clock position of the right breast which showed fibrocystic changes.  Bilateral diagnostic mammogram done 12/24/2018 was negative.  She is set up for bilateral diagnostic mammogram in 11/2019.    3.  Smoking/SOB.  Cessation is recommended.  Pt has 30 year history  of smoking.  Last CXR was done 11/06/2013 and showed IMPRESSION: 1. Bilateral hilar and infrahilar fullness. Although this may be vascular to exclude a mass lesion or adenopathy, chest CT suggested. 2. Basilar atelectasis with possible infiltrates. Bronchiectasis particularly in the right lung base cannot be excluded. These findings can also be further evaluated with chest CT.  CT chest done 02/10/2019 reviewed and showed  IMPRESSION: 1. Unusual appearance of the lung parenchyma. Given the presence of mediastinal and bilateral hilar lymphadenopathy, and the predominantly perilymphatic distribution of findings in the lungs, the primary differential consideration is of sarcoidosis. Further clinical evaluation is recommended. Additionally, repeat high-resolution chest CT is suggested in 6 months to assess for temporal changes in the appearance of the lung parenchyma. 2. Diffuse bronchial wall thickening with mild centrilobular and paraseptal emphysema; imaging findings suggestive of underlying COPD. 3. Aortic atherosclerosis. 4. Indeterminate 5.4 x 3.4 cm heterogeneously enhancing lesion in segment 7 of the liver. While this is favored to represent a large cavernous hemangioma, further characterization with nonemergent MRI of the abdomen with and without IV gadolinium is strongly recommended in the near future.  When questioned if she was told she had sarcoid in the past, she thinks she was told this in the past.  Pt is set up for pulmonary evaluation due to scan findings.    4.  Liver lesion.  Felt to be a hemangioma. Pt is set up for MRI of the abdomen for further evaluation.  If negative, pt will be  referred to GI for follow-up.    5.  Hypertension.  Blood pressure is 121/79.   Follow with primary care physician as recommended.  25 minutes spent with more than 50% spent in counseling and coordination of care.    Interval history: Stage IA (T1CN0M0) invasive ductal carcinoma of left  breast, triple negative; S/P left breast lumpectomy by Dr. Arnoldo Morale on 11/12/2013 followed by systemic chemotherapy consisting of TAC x 6 cycles (Adriamycin was discontinued following cycle #3 due to decrease in LVEF) from 12/31/2013- 04/20/2014, followed by XRT from 05/12/2014- 07/03/2014.  Bilateral diagnostic mammogram done 11/28/2016 was negative.  She had a right breast biopsy in October 2014 that showed fibrocystic changes in the right breast.  Current status :  Patient is seen today for follow-up.  She is here to go over scans.      Invasive ductal carcinoma of left breast ([place])   09/24/2013 Initial Diagnosis    Invasive ductal carcinoma of left breast    11/12/2013 Surgery    Left breast lumpectomy by Dr. Arnoldo Morale- High grade invasive ductal carcinoma less than 0.1 cm from posterior and medial margins, 0/1 lymph nodes, triple negative    12/31/2013 - 04/20/2014 Chemotherapy    TAC every 21 days.  Adriamycin dropped following cycle 3 for a decreased EF compared to baseline.    05/12/2014 - 07/03/2014 Radiation Therapy    XRT      Problem List Patient Active Problem List   Diagnosis Date Noted  . Tobacco abuse [Z72.0] 10/21/2014  . 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 Vasomotor instability [R55] 02/16/2014  . Invasive ductal carcinoma of left breast ([place]) [C50.912] 12/11/2013  . HTN (hypertension) [I10] 11/12/2011  . GANGLION-HAND/WRIST [M67.40] 03/04/2008    Past Medical History Past Medical History:  Diagnosis Date  . Cancer ([place]) 11/12/2013   left breast cancer  . 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  Outpatient MUGA scan performed on 02/25/2014 showed a drop in ejection fraction from 72% to 59% and for that reason doxorubicin was discontinued from her chemotherapy regimen starting on cycle 4.   . Chronic sinusitis   . Diabetes mellitus without complication (HCC)    diet controlled  . Hives 05/2014  .  Hypertension   . Invasive ductal carcinoma of left breast ([place]) 12/11/2013  . Personal history of chemotherapy   . Personal history of radiation therapy   . Tobacco abuse 10/21/2014    Past Surgical History Past Surgical History:  Procedure Laterality Date  . ABDOMINAL HYSTERECTOMY    . COLONOSCOPY  01/09/2012   Procedure: COLONOSCOPY;  Surgeon: Dorothyann Peng, MD;  Location: [facility];  Service: Endoscopy;  Laterality: N/A;  1:30 pm  . DILATION AND CURETTAGE OF UTERUS    . ECTOPIC PREGNANCY SURGERY    . GANGLION CYST EXCISION    . INGUINAL HERNIA REPAIR Left   . left leg surgery     17 yrs of age  . PARTIAL MASTECTOMY WITH NEEDLE LOCALIZATION AND AXILLARY SENTINEL LYMPH NODE BX Left 11/12/2013   Procedure: PARTIAL MASTECTOMY WITH NEEDLE LOCALIZATION AND AXILLARY SENTINEL LYMPH NODE BX;  Surgeon: Jamesetta So, MD;  Location: [facility];  Service: General;  Laterality: Left;  . PORT-A-CATH REMOVAL Right 12/20/2016   Procedure: MINOR REMOVAL PORT-A-CATH;  Surgeon: Aviva Signs, MD;  Location: [facility];  Service: General;  Laterality: Right;  . PORTACATH PLACEMENT Right 12/29/2013   Procedure: INSERTION PORT-A-CATH;  Surgeon: Jamesetta So, MD;  Location: [facility];  Service: General;  Laterality: Right;    Family History Family History  Problem Relation Age of Onset  . Diabetes type II Mother   . Diabetes type II Father   . Prostate cancer Father      Social History  reports that she has quit smoking. Her smoking use included cigarettes. She has a 3.75 pack-year smoking history. She has never used smokeless tobacco. She reports current alcohol use. She reports that she does not use drugs.  Medications  Current Outpatient Medications:  .  amLODipine (NORVASC) 5 MG tablet, , Disp: , Rfl:  .  atorvastatin (LIPITOR) 40 MG tablet, Take 40 mg by mouth daily., Disp: , Rfl:  .  lisinopril-hydrochlorothiazide (PRINZIDE,ZESTORETIC) 20-12.5 MG tablet, , Disp: , Rfl:  .  metFORMIN  (GLUCOPHAGE) 850 MG tablet, , Disp: , Rfl:   Allergies Other and Shrimp [shellfish allergy]  Review of Systems Review of Systems - Oncology ROS negative   Physical Exam  Vitals Wt Readings from Last 3 Encounters:  02/17/19 148 lb 1 oz (67.2 kg)  12/31/18 148 lb 8 oz (67.4 kg)  02/22/18 153 lb 12.8 oz (69.8 kg)   Temp Readings from Last 3 Encounters:  02/17/19 97.6 F (36.4 C) (Oral)  12/31/18 98.3 F (36.8 C) (Oral)  02/22/18 97.6 F (36.4 C) (Oral)   BP Readings from Last 3 Encounters:  02/17/19 121/79  12/31/18 (!) 164/95  02/22/18 (!) 164/95   Pulse Readings from Last 3 Encounters:  02/17/19 (!) 108  12/31/18 96  02/22/18 96   Constitutional: Well-developed, well-nourished, and in no distress.   HENT: Head: Normocephalic and atraumatic.  Mouth/Throat: No oropharyngeal exudate. Mucosa moist. Eyes: Pupils are equal, round, and reactive to light. Conjunctivae are normal. No scleral icterus.  Neck: Normal range of motion. Neck supple. No JVD present.  Cardiovascular: Normal rate, regular rhythm and normal heart sounds.  Exam reveals no gallop and no friction rub.   No murmur heard. Pulmonary/Chest: Effort normal and breath sounds normal. No respiratory distress. No wheezes.No rales.  Abdominal: Soft. Bowel sounds are normal. No distension. There is no tenderness. There is no guarding.  Musculoskeletal: No edema or tenderness.  Lymphadenopathy: No cervical, axillary or supraclavicular adenopathy.  Neurological: Alert and oriented to person, place, and time. No cranial nerve deficit.  Skin: Skin is warm and dry. No rash noted. No erythema. No pallor.  Psychiatric: Affect and judgment normal.   Labs No visits with results within 3 Day(s) from this visit.  Latest known visit with results is:  Appointment on 12/31/2018  Component Date Value Ref Range Status  . WBC 12/31/2018 5.0  4.0 - 10.5 K/uL Final  . RBC 12/31/2018 4.49  3.87 - 5.11 MIL/uL Final  . Hemoglobin  12/31/2018 13.4  12.0 - 15.0 g/dL Final  . HCT 12/31/2018 41.7  36.0 - 46.0 % Final  . MCV 12/31/2018 92.9  80.0 - 100.0 fL Final  . MCH 12/31/2018 29.8  26.0 - 34.0 pg Final  . MCHC 12/31/2018 32.1  30.0 - 36.0 g/dL Final  . RDW 12/31/2018 13.2  11.5 - 15.5 % Final  . Platelets 12/31/2018 193  150 - 400 K/uL Final  . nRBC 12/31/2018 0.0  0.0 - 0.2 % Final  . Neutrophils Relative % 12/31/2018 64  % Final  . Neutro Abs 12/31/2018 3.2  1.7 - 7.7 K/uL Final  . Lymphocytes Relative 12/31/2018 25  % Final  . Lymphs Abs 12/31/2018 1.2  0.7 -  4.0 K/uL Final  . Monocytes Relative 12/31/2018 7  % Final  . Monocytes Absolute 12/31/2018 0.4  0.1 - 1.0 K/uL Final  . Eosinophils Relative 12/31/2018 3  % Final  . Eosinophils Absolute 12/31/2018 0.1  0.0 - 0.5 K/uL Final  . Basophils Relative 12/31/2018 1  % Final  . Basophils Absolute 12/31/2018 0.0  0.0 - 0.1 K/uL Final  . Immature Granulocytes 12/31/2018 0  % Final  . Abs Immature Granulocytes 12/31/2018 0.02  0.00 - 0.07 K/uL Final   Performed at [facility], [address] [place], [place] [address]  . Sodium 12/31/2018 137  135 - 145 mmol/L Final  . Potassium 12/31/2018 4.3  3.5 - 5.1 mmol/L Final  . Chloride 12/31/2018 103  98 - 111 mmol/L Final  . CO2 12/31/2018 26  22 - 32 mmol/L Final  . Glucose, Bld 12/31/2018 198* 70 - 99 mg/dL Final  . BUN 12/31/2018 17  6 - 20 mg/dL Final  . Creatinine, Ser 12/31/2018 0.98  0.44 - 1.00 mg/dL Final  . Calcium 12/31/2018 9.5  8.9 - 10.3 mg/dL Final  . Total Protein 12/31/2018 7.4  6.5 - 8.1 g/dL Final  . Albumin 12/31/2018 4.3  3.5 - 5.0 g/dL Final  . AST 12/31/2018 23  15 - 41 U/L Final  . ALT 12/31/2018 30  0 - 44 U/L Final  . Alkaline Phosphatase 12/31/2018 144* 38 - 126 U/L Final  . Total Bilirubin 12/31/2018 0.7  0.3 - 1.2 mg/dL Final  . GFR calc non Af Amer 12/31/2018 >60  >60 mL/min Final  . GFR calc Af Amer 12/31/2018 >60  >60 mL/min Final  . Anion gap 12/31/2018 8  5 - 15 Final    Performed at [facility], [address], [place], [place] [address]  . LDH 12/31/2018 130  98 - 192 U/L Final   Performed at [facility], [address], [place], [place] [address]     Pathology Orders Placed This Encounter  Procedures  . MM Digital Diagnostic Bilat    Standing Status:   Future    Standing Expiration Date:   02/17/2020    Order Specific Question:   Reason for Exam (SYMPTOM  OR DIAGNOSIS REQUIRED)    Answer:   left breast cancer    Order Specific Question:   Is the patient pregnant?    Answer:   No    Order Specific Question:   Preferred imaging location?    Answer:   [facility]  . MR LIVER W WO CONTRAST    Standing Status:   Future    Standing Expiration Date:   04/17/2020    Order Specific Question:   If indicated for the ordered procedure, I authorize the administration of contrast media per Radiology protocol    Answer:   Yes    Order Specific Question:   What is the patient's sedation requirement?    Answer:   No Sedation    Order Specific Question:   Does the patient have a pacemaker or implanted devices?    Answer:   No    Order Specific Question:   Radiology Contrast Protocol - do NOT remove file path    Answer:   [email or web address]    Order Specific Question:   Preferred imaging location?    Answer:   [facility] (table limit-350lbs)  . CBC with Differential    Standing Status:   Future    Standing Expiration Date:   02/18/2020  .  Comprehensive metabolic panel    Standing Status:   Future    Standing Expiration Date:   02/18/2020  . Lactate dehydrogenase    Standing Status:   Future    Standing Expiration Date:   02/18/2020       Zoila Shutter MD

## 2019-02-18 ENCOUNTER — Encounter: Payer: Self-pay | Admitting: Gastroenterology

## 2019-03-24 ENCOUNTER — Ambulatory Visit: Payer: 59 | Admitting: Gastroenterology

## 2019-06-10 ENCOUNTER — Ambulatory Visit: Payer: 59 | Admitting: Gastroenterology

## 2019-06-11 ENCOUNTER — Ambulatory Visit: Payer: 59 | Admitting: Gastroenterology

## 2019-07-29 ENCOUNTER — Telehealth: Payer: Self-pay | Admitting: *Deleted

## 2019-07-29 ENCOUNTER — Encounter: Payer: Self-pay | Admitting: Gastroenterology

## 2019-07-29 ENCOUNTER — Other Ambulatory Visit: Payer: Self-pay

## 2019-07-29 ENCOUNTER — Ambulatory Visit (INDEPENDENT_AMBULATORY_CARE_PROVIDER_SITE_OTHER): Payer: 59 | Admitting: Gastroenterology

## 2019-07-29 ENCOUNTER — Telehealth: Payer: Self-pay | Admitting: Gastroenterology

## 2019-07-29 VITALS — BP 148/91 | HR 98 | Temp 97.0°F | Ht 61.0 in | Wt 145.2 lb

## 2019-07-29 DIAGNOSIS — K769 Liver disease, unspecified: Secondary | ICD-10-CM | POA: Insufficient documentation

## 2019-07-29 NOTE — Telephone Encounter (Signed)
Pt said she was returning a call. [phone]

## 2019-07-29 NOTE — Telephone Encounter (Signed)
See prior note

## 2019-07-29 NOTE — Patient Instructions (Signed)
1. MRI liver to evaluate liver lesion seen on prior CT.  2. Please have your labs done at least two days before your MRI.

## 2019-07-29 NOTE — Progress Notes (Signed)
Primary Care Physician: Rosita Fire, MD Referring provider: Dr. Zoila Shutter Primary Gastroenterologist:  Barney Drain, MD   Chief Complaint  Patient presents with  . discuss CT findings    HPI: Sally [name] is a 63 y.o. female with history of stage Ia invasive ductal carcinoma left breast triple negative, here at the request of Dr. Walden Field for further evaluation of abnormal liver seen on prior CT imaging earlier this year.  She had a chest CT (01/2019) that showed a heterogeneously enhancing lesion in segment 7 of the liver measuring 5.4 x 3.4 cm.  Lesion indeterminate, favored to represent large cavernous hemangioma but nonemergent MRI recommended. Patient has remote h/o sarcoidosis, remembers having bronchoscopy and given diagnosis but no further information available.   Doing well. BM QOD. Sometimes some vague right flank pain, improves after a BM. Sometimes gassy. No melena, brbpr. Appetite good. No heartburn. No n/v. No dysphagia. Colonoscopy 2013, due in 2023. No FH colon cancer or liver cancer. Consumes etoh socially.   Current Outpatient Medications  Medication Sig Dispense Refill  . amLODipine (NORVASC) 10 MG tablet Take 10 mg by mouth daily.     Marland Kitchen atorvastatin (LIPITOR) 40 MG tablet Take 40 mg by mouth daily.    Marland Kitchen lisinopril-hydrochlorothiazide (PRINZIDE,ZESTORETIC) 20-12.5 MG tablet Take 1 tablet by mouth daily.     . metFORMIN (GLUCOPHAGE) 850 MG tablet Take 850 mg by mouth 2 (two) times daily with a meal.      No current facility-administered medications for this visit.     Allergies as of 07/29/2019 - Review Complete 07/29/2019  Allergen Reaction Noted  . Other Other (See Comments) 07/13/2016  . Shrimp [shellfish allergy]  01/16/2017   Past Medical History:  Diagnosis Date  . Cancer ([place]) 11/12/2013   left breast cancer  . 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  Outpatient MUGA scan performed on 02/25/2014 showed a drop in  ejection fraction from 72% to 59% and for that reason doxorubicin was discontinued from her chemotherapy regimen starting on cycle 4.   . Chronic sinusitis   . Diabetes mellitus without complication (HCC)    diet controlled  . Hives 05/2014  . Hypertension   . Invasive ductal carcinoma of left breast ([place]) 12/11/2013  . Personal history of chemotherapy   . Personal history of radiation therapy   . Sarcoidosis    diagnosed in the 1990s. doesn't recall treatment  . Tobacco abuse 10/21/2014   Past Surgical History:  Procedure Laterality Date  . ABDOMINAL HYSTERECTOMY    . COLONOSCOPY  01/09/2012   Dr. Oneida Alar: diverticulosis, hyperplastic polyps, hemorrhoids. next TCS in 10 years.   Marland Kitchen DILATION AND CURETTAGE OF UTERUS    . ECTOPIC PREGNANCY SURGERY    . GANGLION CYST EXCISION    . INGUINAL HERNIA REPAIR Left   . left leg surgery     17 yrs of age  . PARTIAL MASTECTOMY WITH NEEDLE LOCALIZATION AND AXILLARY SENTINEL LYMPH NODE BX Left 11/12/2013   Procedure: PARTIAL MASTECTOMY WITH NEEDLE LOCALIZATION AND AXILLARY SENTINEL LYMPH NODE BX;  Surgeon: Jamesetta So, MD;  Location: [facility];  Service: General;  Laterality: Left;  . PORT-A-CATH REMOVAL Right 12/20/2016   Procedure: MINOR REMOVAL PORT-A-CATH;  Surgeon: Aviva Signs, MD;  Location: [facility];  Service: General;  Laterality: Right;  . PORTACATH PLACEMENT Right 12/29/2013   Procedure: INSERTION PORT-A-CATH;  Surgeon: Jamesetta So, MD;  Location: [facility];  Service: General;  Laterality:  Right;   Family History  Problem Relation Age of Onset  . Diabetes type II Mother   . Diabetes type II Father   . Prostate cancer Father   . Colon cancer Neg Hx   . Liver cancer Neg Hx    Social History   Tobacco Use  . Smoking status: Current Every Day Smoker    Packs/day: 0.50    Types: Cigarettes  . Smokeless tobacco: Never Used  Substance Use Topics  . Alcohol use: Yes    Comment: social once a week  . Drug use: No    ROS:  General:  Negative for anorexia, weight loss, fever, chills, fatigue, weakness. ENT: Negative for hoarseness, difficulty swallowing , nasal congestion. CV: Negative for chest pain, angina, palpitations, dyspnea on exertion, peripheral edema.  Respiratory: Negative for dyspnea at rest, dyspnea on exertion, cough, sputum, wheezing.  GI: See history of present illness. GU:  Negative for dysuria, hematuria, urinary incontinence, urinary frequency, nocturnal urination.  Endo: Negative for unusual weight change.    Physical Examination:   BP (!) 148/91   Pulse 98   Temp (!) 97 F (36.1 C) (Oral)   Ht 5\' 1"  (1.549 m)   Wt 145 lb 3.2 oz (65.9 kg)   BMI 27.44 kg/m   General: Well-nourished, well-developed in no acute distress.  Eyes: No icterus. Mouth: Oropharyngeal mucosa moist and pink , no lesions erythema or exudate. Lungs: Clear to auscultation bilaterally.  Heart: Regular rate and rhythm, no murmurs rubs or gallops.  Abdomen: Bowel sounds are normal, nontender, nondistended, no hepatosplenomegaly or masses, no abdominal bruits or hernia , no rebound or guarding.   Extremities: No lower extremity edema. No clubbing or deformities. Neuro: Alert and oriented x 4   Skin: Warm and dry, no jaundice.   Psych: Alert and cooperative, normal mood and affect.  Labs:  Lab Results  Component Value Date   CREATININE 0.98 12/31/2018   BUN 17 12/31/2018   NA 137 12/31/2018   K 4.3 12/31/2018   CL 103 12/31/2018   CO2 26 12/31/2018   Lab Results  Component Value Date   ALT 30 12/31/2018   AST 23 12/31/2018   ALKPHOS 144 (H) 12/31/2018   BILITOT 0.7 12/31/2018   Lab Results  Component Value Date   WBC 5.0 12/31/2018   HGB 13.4 12/31/2018   HCT 41.7 12/31/2018   MCV 92.9 12/31/2018   PLT 193 12/31/2018     Imaging Studies: No results found.

## 2019-07-29 NOTE — Telephone Encounter (Signed)
Patient called back and is aware of appt details. She works that day and she is going to call CS to reschedule

## 2019-07-29 NOTE — Telephone Encounter (Signed)
[employer] website and received message "Notification/Precertification Requirement This member's plan does not currently require notification or prior-authorization through the [employer] Notification or Prior-Authorization Program."  LMOVM for pt. MRI scheduled at [facility]. Scheduled for 07/31/2019 at 9:00am, arrival time 8:30am, npo 4 hrs prior

## 2019-07-29 NOTE — Assessment & Plan Note (Signed)
Very pleasant 63 year old female with history of stage Ia invasive ductal carcinoma left breast, completed treatment in 2015, found to have liver lesion on chest CT imaging in February.  She had a 5.4 x 3.4 cm heterogeneously enhancing lesion in segment 7 of the liver, indeterminate but favored to represent large cavernous hemangioma.  Nonemergent MRI was recommended.  This has not been done.  We will pursue MRI at this time.  Update Cmet, obtain baseline AFP. Further recommendations to follow.

## 2019-07-31 ENCOUNTER — Ambulatory Visit (HOSPITAL_COMMUNITY): Payer: 59

## 2019-07-31 NOTE — Progress Notes (Signed)
cc'd to pcp 

## 2019-08-05 ENCOUNTER — Other Ambulatory Visit: Payer: Self-pay | Admitting: Gastroenterology

## 2019-08-05 ENCOUNTER — Other Ambulatory Visit (HOSPITAL_COMMUNITY)
Admission: RE | Admit: 2019-08-05 | Discharge: 2019-08-05 | Disposition: A | Discharge status: Home / Self Care | Payer: 59 | Source: Ambulatory Visit | Attending: Gastroenterology | Admitting: Gastroenterology

## 2019-08-05 ENCOUNTER — Telehealth: Payer: Self-pay | Admitting: Gastroenterology

## 2019-08-05 DIAGNOSIS — K769 Liver disease, unspecified: Secondary | ICD-10-CM | POA: Diagnosis not present

## 2019-08-05 LAB — COMPREHENSIVE METABOLIC PANEL
ALT: 27 U/L (ref 0–44)
AST: 24 U/L (ref 15–41)
Albumin: 4.3 g/dL (ref 3.5–5.0)
Alkaline Phosphatase: 149 U/L — ABNORMAL HIGH (ref 38–126)
Anion gap: 8 (ref 5–15)
BUN: 18 mg/dL (ref 6–20)
CO2: 27 mmol/L (ref 22–32)
Calcium: 9.8 mg/dL (ref 8.9–10.3)
Chloride: 105 mmol/L (ref 98–111)
Creatinine, Ser: 1.02 mg/dL — ABNORMAL HIGH (ref 0.44–1.00)
GFR calc Af Amer: >60 mL/min (ref >60)
GFR calc non Af Amer: 60 mL/min — ABNORMAL LOW (ref >60)
Glucose, Bld: 132 mg/dL — ABNORMAL HIGH (ref 70–99)
Potassium: 3.9 mmol/L (ref 3.5–5.1)
Sodium: 140 mmol/L (ref 135–145)
Total Bilirubin: 0.8 mg/dL (ref 0.3–1.2)
Total Protein: 7.7 g/dL (ref 6.5–8.1)

## 2019-08-05 NOTE — Telephone Encounter (Signed)
Pt has questions about her labs. Please call her at [phone]

## 2019-08-06 LAB — AFP TUMOR MARKER: AFP, Serum, Tumor Marker: 1.5 ng/mL (ref 0.0–8.3)

## 2019-08-07 ENCOUNTER — Other Ambulatory Visit: Payer: Self-pay

## 2019-08-07 ENCOUNTER — Ambulatory Visit (HOSPITAL_COMMUNITY)
Admission: RE | Admit: 2019-08-07 | Discharge: 2019-08-07 | Disposition: A | Discharge status: Home / Self Care | Payer: 59 | Source: Ambulatory Visit | Attending: Gastroenterology | Admitting: Gastroenterology

## 2019-08-07 DIAGNOSIS — K769 Liver disease, unspecified: Secondary | ICD-10-CM | POA: Diagnosis present

## 2019-08-07 MED ORDER — GADOBUTROL 1 MMOL/ML IV SOLN
6.0000 mL | Freq: Once | INTRAVENOUS | Status: AC | PRN
  Administered 2019-08-07: 08:00:00 6 mL via INTRAVENOUS

## 2019-08-07 NOTE — Telephone Encounter (Signed)
PT said she went and got her labs on 08/05/2019 and was wondering if she did right.  I told her it was requested to do at least 2 days prior to MRI.  She just wanted to let us know the labs were done.

## 2019-08-07 NOTE — Telephone Encounter (Signed)
FYI to Leslie Lewis, PA.  

## 2019-08-07 NOTE — Telephone Encounter (Signed)
noted 

## 2019-08-11 NOTE — Progress Notes (Signed)
I called [employer] and spoke to [employer].  They will check and see if mitochondrial can be added ( R74.8).   They will fax info.

## 2019-08-12 ENCOUNTER — Telehealth: Payer: Self-pay | Admitting: Gastroenterology

## 2019-08-12 NOTE — Telephone Encounter (Signed)
See previous lab results.

## 2019-08-12 NOTE — Telephone Encounter (Signed)
Pt said she was returning a call from yesterday or today (she didn't know). I told her that I wasn't sure who had called, but I would ask DS when she is available if she had tried calling. [phone]

## 2019-08-13 LAB — SPECIMEN STATUS REPORT

## 2019-08-13 LAB — MITOCHONDRIAL ANTIBODIES: Mitochondrial Ab: <20 Units (ref 0.0–20.0)

## 2019-08-14 ENCOUNTER — Telehealth: Payer: Self-pay | Admitting: *Deleted

## 2019-08-14 NOTE — Telephone Encounter (Signed)
Lab results received and placed for LSL to review

## 2019-08-25 ENCOUNTER — Encounter: Payer: Self-pay | Admitting: Gastroenterology

## 2019-08-25 ENCOUNTER — Other Ambulatory Visit: Payer: Self-pay

## 2019-08-25 DIAGNOSIS — K769 Liver disease, unspecified: Secondary | ICD-10-CM

## 2019-08-25 NOTE — Progress Notes (Signed)
SCHEDULED AND LETTER SENT  °

## 2019-09-29 ENCOUNTER — Encounter: Payer: Self-pay | Admitting: *Deleted

## 2019-09-29 ENCOUNTER — Other Ambulatory Visit: Payer: Self-pay | Admitting: *Deleted

## 2019-09-29 DIAGNOSIS — K769 Liver disease, unspecified: Secondary | ICD-10-CM

## 2019-12-11 ENCOUNTER — Other Ambulatory Visit (HOSPITAL_COMMUNITY): Payer: Self-pay | Admitting: Hematology

## 2019-12-11 DIAGNOSIS — C50912 Malignant neoplasm of unspecified site of left female breast: Secondary | ICD-10-CM

## 2019-12-29 ENCOUNTER — Other Ambulatory Visit (HOSPITAL_COMMUNITY): Payer: Self-pay | Admitting: *Deleted

## 2019-12-29 DIAGNOSIS — C50912 Malignant neoplasm of unspecified site of left female breast: Secondary | ICD-10-CM

## 2019-12-29 DIAGNOSIS — K769 Liver disease, unspecified: Secondary | ICD-10-CM

## 2019-12-30 ENCOUNTER — Other Ambulatory Visit: Payer: Self-pay

## 2019-12-30 ENCOUNTER — Ambulatory Visit (HOSPITAL_COMMUNITY)
Admission: RE | Admit: 2019-12-30 | Discharge: 2019-12-30 | Disposition: A | Discharge status: Home / Self Care | Payer: 59 | Source: Ambulatory Visit | Attending: Internal Medicine | Admitting: Internal Medicine

## 2019-12-30 ENCOUNTER — Encounter (HOSPITAL_COMMUNITY): Payer: 59

## 2019-12-30 ENCOUNTER — Ambulatory Visit (HOSPITAL_COMMUNITY)
Admission: RE | Admit: 2019-12-30 | Discharge: 2019-12-30 | Disposition: A | Discharge status: Home / Self Care | Payer: 59 | Source: Ambulatory Visit | Attending: Hematology | Admitting: Hematology

## 2019-12-30 ENCOUNTER — Other Ambulatory Visit (HOSPITAL_COMMUNITY)
Admission: RE | Admit: 2019-12-30 | Discharge: 2019-12-30 | Disposition: A | Discharge status: Home / Self Care | Payer: 59 | Source: Ambulatory Visit | Attending: Gastroenterology | Admitting: Gastroenterology

## 2019-12-30 ENCOUNTER — Inpatient Hospital Stay (HOSPITAL_COMMUNITY): Payer: 59 | Attending: Hematology

## 2019-12-30 ENCOUNTER — Ambulatory Visit (HOSPITAL_COMMUNITY): Admission: RE | Admit: 2019-12-30 | Payer: 59 | Source: Ambulatory Visit

## 2019-12-30 DIAGNOSIS — Z171 Estrogen receptor negative status [ER-]: Secondary | ICD-10-CM | POA: Insufficient documentation

## 2019-12-30 DIAGNOSIS — Z79899 Other long term (current) drug therapy: Secondary | ICD-10-CM | POA: Insufficient documentation

## 2019-12-30 DIAGNOSIS — C50912 Malignant neoplasm of unspecified site of left female breast: Secondary | ICD-10-CM | POA: Diagnosis not present

## 2019-12-30 DIAGNOSIS — E119 Type 2 diabetes mellitus without complications: Secondary | ICD-10-CM | POA: Insufficient documentation

## 2019-12-30 DIAGNOSIS — K769 Liver disease, unspecified: Secondary | ICD-10-CM | POA: Insufficient documentation

## 2019-12-30 DIAGNOSIS — I10 Essential (primary) hypertension: Secondary | ICD-10-CM | POA: Insufficient documentation

## 2019-12-30 LAB — CBC WITH DIFFERENTIAL/PLATELET
Abs Immature Granulocytes: 0.02 10*3/uL (ref 0.00–0.07)
Basophils Absolute: 0.1 10*3/uL (ref 0.0–0.1)
Basophils Relative: 1 %
Eosinophils Absolute: 0.2 10*3/uL (ref 0.0–0.5)
Eosinophils Relative: 2 %
HCT: 41.7 % (ref 36.0–46.0)
Hemoglobin: 13.8 g/dL (ref 12.0–15.0)
Immature Granulocytes: 0 %
Lymphocytes Relative: 18 %
Lymphs Abs: 1.5 10*3/uL (ref 0.7–4.0)
MCH: 30.5 pg (ref 26.0–34.0)
MCHC: 33.1 g/dL (ref 30.0–36.0)
MCV: 92.3 fL (ref 80.0–100.0)
Monocytes Absolute: 0.6 10*3/uL (ref 0.1–1.0)
Monocytes Relative: 8 %
Neutro Abs: 5.9 10*3/uL (ref 1.7–7.7)
Neutrophils Relative %: 71 %
Platelets: 217 10*3/uL (ref 150–400)
RBC: 4.52 MIL/uL (ref 3.87–5.11)
RDW: 12.4 % (ref 11.5–15.5)
WBC: 8.3 10*3/uL (ref 4.0–10.5)
nRBC: 0 % (ref 0.0–0.2)

## 2019-12-30 LAB — COMPREHENSIVE METABOLIC PANEL
ALT: 26 U/L (ref 0–44)
AST: 22 U/L (ref 15–41)
Albumin: 4.3 g/dL (ref 3.5–5.0)
Alkaline Phosphatase: 164 U/L — ABNORMAL HIGH (ref 38–126)
Anion gap: 10 (ref 5–15)
BUN: 18 mg/dL (ref 8–23)
CO2: 27 mmol/L (ref 22–32)
Calcium: 9.6 mg/dL (ref 8.9–10.3)
Chloride: 101 mmol/L (ref 98–111)
Creatinine, Ser: 0.9 mg/dL (ref 0.44–1.00)
GFR calc Af Amer: >60 mL/min (ref >60)
GFR calc non Af Amer: >60 mL/min (ref >60)
Glucose, Bld: 130 mg/dL — ABNORMAL HIGH (ref 70–99)
Potassium: 3.4 mmol/L — ABNORMAL LOW (ref 3.5–5.1)
Sodium: 138 mmol/L (ref 135–145)
Total Bilirubin: 0.7 mg/dL (ref 0.3–1.2)
Total Protein: 7.6 g/dL (ref 6.5–8.1)

## 2019-12-30 LAB — HEPATIC FUNCTION PANEL
ALT: 26 U/L (ref 0–44)
AST: 23 U/L (ref 15–41)
Albumin: 4.3 g/dL (ref 3.5–5.0)
Alkaline Phosphatase: 168 U/L — ABNORMAL HIGH (ref 38–126)
Bilirubin, Direct: 0.1 mg/dL (ref 0.0–0.2)
Indirect Bilirubin: 0.6 mg/dL (ref 0.3–0.9)
Total Bilirubin: 0.7 mg/dL (ref 0.3–1.2)
Total Protein: 7.6 g/dL (ref 6.5–8.1)

## 2019-12-30 LAB — LACTATE DEHYDROGENASE: LDH: 122 U/L (ref 98–192)

## 2019-12-30 LAB — GAMMA GT: GGT: 36 U/L (ref 7–50)

## 2019-12-31 ENCOUNTER — Other Ambulatory Visit (HOSPITAL_COMMUNITY): Payer: Self-pay | Admitting: *Deleted

## 2019-12-31 ENCOUNTER — Other Ambulatory Visit: Payer: Self-pay | Admitting: Hematology

## 2019-12-31 DIAGNOSIS — R921 Mammographic calcification found on diagnostic imaging of breast: Secondary | ICD-10-CM

## 2019-12-31 NOTE — Progress Notes (Signed)
I was notified by mammography tech that patient's recent mammogram results require a stereotactic breast biopsy.  I have called and scheduled the patient at the [facility] in [place].  I spoke with patient via telephone and she was already aware of the need for biopsy.  I provided her appointment date and time. She verbalizes understanding. We will also reschedule her appt with Dr. Raliegh Ip.  Our schedulers will call her with the new appt. She verbalizes understanding of this as well.

## 2020-01-05 ENCOUNTER — Telehealth: Payer: Self-pay | Admitting: Gastroenterology

## 2020-01-05 NOTE — Progress Notes (Signed)
LMOM to call.

## 2020-01-05 NOTE — Telephone Encounter (Signed)
Pt was returning a call. [phone]

## 2020-01-06 ENCOUNTER — Ambulatory Visit (HOSPITAL_COMMUNITY): Payer: 59 | Admitting: Hematology

## 2020-01-06 NOTE — Progress Notes (Signed)
Pt is aware of results and she will see Dr. Legrand Rams to discuss. Manuela Schwartz, please forward labs to Dr. Legrand Rams. Pt asked if she was still to do her CT and I told her that she should do anything she was supposed to do.

## 2020-01-06 NOTE — Telephone Encounter (Signed)
Spoke to pt earlier today.

## 2020-01-08 ENCOUNTER — Other Ambulatory Visit: Payer: Self-pay

## 2020-01-08 ENCOUNTER — Ambulatory Visit
Admission: RE | Admit: 2020-01-08 | Discharge: 2020-01-08 | Disposition: A | Discharge status: Home / Self Care | Payer: 59 | Source: Ambulatory Visit | Attending: Hematology | Admitting: Hematology

## 2020-01-08 DIAGNOSIS — R921 Mammographic calcification found on diagnostic imaging of breast: Secondary | ICD-10-CM

## 2020-01-15 ENCOUNTER — Other Ambulatory Visit: Payer: Self-pay

## 2020-01-15 ENCOUNTER — Encounter (HOSPITAL_COMMUNITY): Payer: Self-pay | Admitting: Hematology

## 2020-01-15 ENCOUNTER — Inpatient Hospital Stay (HOSPITAL_BASED_OUTPATIENT_CLINIC_OR_DEPARTMENT_OTHER): Payer: 59 | Admitting: Hematology

## 2020-01-15 DIAGNOSIS — D0512 Intraductal carcinoma in situ of left breast: Secondary | ICD-10-CM | POA: Diagnosis not present

## 2020-01-15 DIAGNOSIS — C50912 Malignant neoplasm of unspecified site of left female breast: Secondary | ICD-10-CM | POA: Diagnosis not present

## 2020-01-15 NOTE — Patient Instructions (Addendum)
[place] at [facility] Discharge Instructions  You were seen today by Dr. Delton Coombes. He went over your history, family history and how you've been since your biopsy. Due to the results from your biopsy Dr. Delton Coombes recommends that you have a mastectomy as well as some lymph nodes removed from the area. He will see you back in 8 weeks for follow up.   Thank you for choosing [place] at [facility] to provide your oncology and hematology care.  To afford each patient quality time with our provider, please arrive at least 15 minutes before your scheduled appointment time.   If you have a lab appointment with the [facility] please come in thru the  Main Entrance and check in at the main information desk  You need to re-schedule your appointment should you arrive 10 or more minutes late.  We strive to give you quality time with our providers, and arriving late affects you and other patients whose appointments are after yours.  Also, if you no show three or more times for appointments you may be dismissed from the clinic at the providers discretion.     Again, thank you for choosing [facility].  Our hope is that these requests will decrease the amount of time that you wait before being seen by our physicians.       _____________________________________________________________  Should you have questions after your visit to [facility], please contact our office at [phone] between the hours of 8:00 a.m. and 4:30 p.m.  Voicemails left after 4:00 p.m. will not be returned until the following business day.  For prescription refill requests, have your pharmacy contact our office and allow 72 hours.    Cancer Center Support Programs:   > Cancer Support Group  2nd Tuesday of the month 1pm-2pm, Journey Room

## 2020-01-15 NOTE — Progress Notes (Signed)
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  CURRENT THERAPY: Being planned for surgery.  BRIEF ONCOLOGIC HISTORY:  Oncology History  Invasive ductal carcinoma of left breast (Sally [name])  09/24/2013 Initial Diagnosis   Invasive ductal carcinoma of left breast   11/12/2013 Surgery   Left breast lumpectomy by Dr. Arnoldo [name]- High grade invasive ductal carcinoma less than 0.1 cm from posterior and medial margins, 0/1 lymph nodes, triple negative   12/31/2013 - 04/20/2014 Chemotherapy   TAC every 21 days.  Adriamycin dropped following cycle 3 for a decreased EF compared to baseline.   05/12/2014 - 07/03/2014 Radiation Therapy   XRT      [name] STAGING: [name] Staging Invasive ductal carcinoma of left breast (HCC) Staging form: Breast, AJCC 7th Edition - Clinical: Stage IA (T1c, N0, cM0) - Signed by Sally Cancer, PA-C on 01/06/2014    INTERVAL HISTORY:  Sally [name] 64 y.o. 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  She underwent mammogram on 12/30/2019 which showed new linear calcifications immediately anterior and inferior from the lumpectomy site previously.  She underwent breast biopsy on 01/08/2020.  She had previous history of left breast [name], triple negative and received 6 cycles of chemotherapy after lumpectomy followed by radiation therapy. She attained menarche at age 56.  She attained menopause when she had a hysterectomy in her 38s.  Hysterectomy was done secondary to fibroids.  She had 2 miscarriages and one tubal pregnancy.  She has 2 live children.  First childbirth age was 57.  She took oral contraceptives for 7 years.  No hormone replacement therapy.  She had a couple of right  breast biopsies done which were benign.  She lives with her boyfriend and does a full-time job at [employer] in [place].  Denies any side effects from previous chemotherapy.    REVIEW OF SYSTEMS:  Review of Systems  All other systems reviewed and are negative.    PAST MEDICAL/SURGICAL HISTORY:  Past Medical History:  Diagnosis Date  . [name] ([place]) 11/12/2013   left breast [name]  . 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  Outpatient MUGA scan performed on 02/25/2014 showed a drop in ejection fraction from 72% to 59% and for that reason doxorubicin was discontinued from her chemotherapy regimen starting on cycle 4.   . Chronic sinusitis   . Diabetes mellitus without complication (HCC)    diet controlled  . Hives 05/2014  . Hypertension   . Invasive ductal carcinoma of left breast ([place]) 12/11/2013  . Personal history of chemotherapy   . Personal history of radiation therapy   . Sarcoidosis    diagnosed in the 1990s. doesn't recall treatment  . Tobacco abuse 10/21/2014   Past Surgical History:  Procedure Laterality Date  . ABDOMINAL HYSTERECTOMY    . COLONOSCOPY  01/09/2012   Sally [name]: diverticulosis, hyperplastic polyps, hemorrhoids. next TCS in 10 years.   Marland Kitchen DILATION AND CURETTAGE OF UTERUS    . ECTOPIC PREGNANCY SURGERY    . GANGLION CYST EXCISION    . INGUINAL HERNIA REPAIR Left   . left leg surgery     17 yrs of age  . PARTIAL MASTECTOMY WITH NEEDLE LOCALIZATION AND AXILLARY SENTINEL LYMPH NODE  BX Left 11/12/2013   Procedure: PARTIAL MASTECTOMY WITH NEEDLE LOCALIZATION AND AXILLARY SENTINEL LYMPH NODE BX;  Surgeon: Sally So, MD;  Location: [facility];  Service: General;  Laterality: Left;  . PORT-A-CATH REMOVAL Right 12/20/2016   Procedure: MINOR REMOVAL PORT-A-CATH;  Surgeon: Sally Signs, MD;  Location: [facility];  Service: General;  Laterality: Right;  . PORTACATH PLACEMENT Right 12/29/2013   Procedure: INSERTION  PORT-A-CATH;  Surgeon: Sally So, MD;  Location: [facility];  Service: General;  Laterality: Right;     SOCIAL HISTORY:  Social History   Socioeconomic History  . Marital status: Divorced    Spouse name: Not on file  . Number of children: Not on file  . Years of education: Not on file  . Highest education level: Not on file  Occupational History  . Not on file  Tobacco Use  . Smoking status: Current Every Day Smoker    Packs/day: 0.50    Types: Cigarettes  . Smokeless tobacco: Never Used  Substance and Sexual Activity  . Alcohol use: Yes    Comment: social once a week  . Drug use: No  . Sexual activity: Yes    Birth control/protection: Surgical  Other Topics Concern  . Not on file  Social History Narrative  . Not on file   Social Determinants of Health   Financial Resource Strain:   . Difficulty of Paying Living Expenses: Not on file  Food Insecurity:   . Worried About Charity fundraiser in the Last Year: Not on file  . Ran Out of Food in the Last Year: Not on file  Transportation Needs:   . Lack of Transportation (Medical): Not on file  . Lack of Transportation (Non-Medical): Not on file  Physical Activity:   . Days of Exercise per Week: Not on file  . Minutes of Exercise per Session: Not on file  Stress:   . Feeling of Stress : Not on file  Social Connections:   . Frequency of Communication with Friends and Family: Not on file  . Frequency of Social Gatherings with Friends and Family: Not on file  . Attends Religious Services: Not on file  . Active Member of Clubs or Organizations: Not on file  . Attends Archivist Meetings: Not on file  . Marital Status: Not on file  Intimate Partner Violence:   . Fear of Current or Ex-Partner: Not on file  . Emotionally Abused: Not on file  . Physically Abused: Not on file  . Sexually Abused: Not on file    FAMILY HISTORY:  Family History  Problem Relation Age of Onset  . Diabetes type II Mother   .  Diabetes type II Father   . Prostate [name] Father   . Colon [name] Neg Hx   . Liver [name] Neg Hx     CURRENT MEDICATIONS:  Outpatient Encounter Medications as of 01/15/2020  Medication Sig  . amLODipine (NORVASC) 10 MG tablet Take 10 mg by mouth daily.   Marland Kitchen atorvastatin (LIPITOR) 40 MG tablet Take 40 mg by mouth daily.  Marland Kitchen lisinopril-hydrochlorothiazide (PRINZIDE,ZESTORETIC) 20-12.5 MG tablet Take 1 tablet by mouth daily.   . metFORMIN (GLUCOPHAGE) 850 MG tablet Take 850 mg by mouth 2 (two) times daily with a meal.    No facility-administered encounter medications on file as of 01/15/2020.    ALLERGIES:  Allergies  Allergen Reactions  . Other Other (See Comments)    Beer- itching/swelling  . Shrimp [Shellfish Allergy]  PHYSICAL EXAM:  ECOG Performance status: 0  Vitals:   01/15/20 1419  BP: 114/68  Pulse: (!) 111  Resp: 18  Temp: (!) 97.5 F (36.4 C)  SpO2: 99%   Filed Weights   01/15/20 1419  Weight: 141 lb 3.2 oz (64 kg)    Physical Exam Vitals reviewed.  Constitutional:      Appearance: Normal appearance.  Cardiovascular:     Rate and Rhythm: Normal rate and regular rhythm.     Heart sounds: Normal heart sounds.  Pulmonary:     Effort: Pulmonary effort is normal.     Breath sounds: Normal breath sounds.  Abdominal:     General: There is no distension.     Palpations: Abdomen is soft. There is no mass.  Skin:    General: Skin is warm.  Neurological:     General: No focal deficit present.     Mental Status: She is alert and oriented to person, place, and time.  Psychiatric:        Mood and Affect: Mood normal.        Behavior: Behavior normal.    Left breast biopsy site to Band-Aid is present.  No clearly palpable mass.  Left lumpectomy site is within normal limits.  No palpable masses in bilateral breast.  No palpable axillary adenopathy.  LABORATORY DATA:  I have reviewed the labs as listed.  CBC    Component Value Date/Time   WBC 8.3  12/30/2019 1129   RBC 4.52 12/30/2019 1129   HGB 13.8 12/30/2019 1129   HCT 41.7 12/30/2019 1129   PLT 217 12/30/2019 1129   MCV 92.3 12/30/2019 1129   MCH 30.5 12/30/2019 1129   MCHC 33.1 12/30/2019 1129   RDW 12.4 12/30/2019 1129   LYMPHSABS 1.5 12/30/2019 1129   MONOABS 0.6 12/30/2019 1129   EOSABS 0.2 12/30/2019 1129   BASOSABS 0.1 12/30/2019 1129   CMP Latest Ref Rng & Units 12/30/2019 12/30/2019 08/05/2019  Glucose 70 - 99 mg/dL - 130(H) 132(H)  BUN 8 - 23 mg/dL - 18 18  Creatinine 0.44 - 1.00 mg/dL - 0.90 1.02(H)  Sodium 135 - 145 mmol/L - 138 140  Potassium 3.5 - 5.1 mmol/L - 3.4(L) 3.9  Chloride 98 - 111 mmol/L - 101 105  CO2 22 - 32 mmol/L - 27 27  Calcium 8.9 - 10.3 mg/dL - 9.6 9.8  Total Protein 6.5 - 8.1 g/dL 7.6 7.6 7.7  Total Bilirubin 0.3 - 1.2 mg/dL 0.7 0.7 0.8  Alkaline Phos 38 - 126 U/L 168(H) 164(H) 149(H)  AST 15 - 41 U/L 23 22 24   ALT 0 - 44 U/L 26 26 27        DIAGNOSTIC IMAGING:  I have independently reviewed the scans and discussed with the patient.    ASSESSMENT & PLAN:   Breast neoplasm, Tis (DCIS), left 1.  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  There is no associated mass.  Reminder of the left breast is negative.  To breast biopsy clips in the right breast with no masses, suspicious microcalcification. -Left breast core biopsy upper outer quadrant on 01/08/2020 showed high-grade DCIS with necrosis and focus suspicious for microinvasion.  The carcinoma appears high-grade.  ER is 2% and PR is 2%. -We reviewed results of the pathology report with the patient in  detail. -I have recommended left mastectomy and lymph node biopsy.  She had previous history of left breast TNBC.  She is agreeable for mastectomy. -I think she  will also benefit from genetic testing.  We will make a referral after we see her back after surgery.  2.  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 -Status post 6 cycles of TAC from 12/31/2013 through 04/20/2014 with Adriamycin held after first 3 cycles secondary to decreased ejection fraction. -XRT from 67 2015-17 2015.  3.  Family history: -Father had prostate [name].  2 maternal first cousins had breast [name].  1 maternal cousin had stomach [name].   Total time spent is 40 minutes with more than 50% of the time spent face-to-face reviewing records, discussing new diagnosis, treatment plan, counseling and coordination of care.  Orders placed this encounter:  No orders of the defined types were placed in this encounter.     Derek Jack, MD [place] [phone]

## 2020-01-15 NOTE — Assessment & Plan Note (Signed)
1.  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  There is no associated mass.  Reminder of the left breast is negative.  To breast biopsy clips in the right breast with no masses, suspicious microcalcification. -Left breast core biopsy upper outer quadrant on 01/08/2020 showed high-grade DCIS with necrosis and focus suspicious for microinvasion.  The carcinoma appears high-grade.  ER is 2% and PR is 2%. -We reviewed results of the pathology report with the patient in detail. -I have recommended left mastectomy and lymph node biopsy.  She had previous history of left breast TNBC.  She is agreeable for mastectomy. -I think she will also benefit from genetic testing.  We will make a referral after we see her back after surgery.  2.  Left triple negative breast cancer: -Status post lumpectomy on 11/12/2013. -Status post 6 cycles of TAC from 12/31/2013 through 04/20/2014 with Adriamycin held after first 3 cycles secondary to decreased ejection fraction. -XRT from 21 2015-17 2015.  3.  Family history: -Father had prostate cancer.  2 maternal first cousins had breast cancer.  1 maternal cousin had stomach cancer.

## 2020-01-20 ENCOUNTER — Encounter: Payer: Self-pay | Admitting: General Surgery

## 2020-01-20 ENCOUNTER — Ambulatory Visit (INDEPENDENT_AMBULATORY_CARE_PROVIDER_SITE_OTHER): Payer: 59 | Admitting: General Surgery

## 2020-01-20 ENCOUNTER — Other Ambulatory Visit: Payer: Self-pay

## 2020-01-20 VITALS — BP 136/92 | HR 84 | Temp 97.4°F | Resp 16 | Ht 61.0 in | Wt 143.0 lb

## 2020-01-20 DIAGNOSIS — C50412 Malignant neoplasm of upper-outer quadrant of left female breast: Secondary | ICD-10-CM | POA: Diagnosis not present

## 2020-01-20 DIAGNOSIS — Z171 Estrogen receptor negative status [ER-]: Secondary | ICD-10-CM

## 2020-01-20 NOTE — Patient Instructions (Signed)
Total or Modified Radical Mastectomy A total mastectomy and a modified radical mastectomy are surgeries that are done as part of treatment for breast cancer. You will have one of those types of surgery. Both types involve removing a breast.  In a total mastectomy (simple mastectomy), all breast tissue including the nipple will be removed.  In a modified radical mastectomy, lymph nodes under the arm will be removed along with the breast and nipple. Some of the lining over the muscle tissues under the breast may also be removed. These procedures may also be used to help prevent breast cancer. A preventive (prophylactic) mastectomy may be done if you are at an increased risk of breast cancer due to harmful changes (mutations) in certain genes (BRCA genes). In that case, the procedure involves removing both of your breasts. This can reduce your risk of developing breast cancer in the future. For a transgender person, a total mastectomy may be done as part of a surgical transition from female to female. Let your health care provider know about:  Any allergies you have.  All medicines you are taking, including vitamins, herbs, eye drops, creams, and over-the-counter medicines.  Any problems you or family members have had with anesthetic medicines.  Any blood disorders you have.  Any surgeries you have had.  Any medical conditions you have.  Whether you are pregnant or may be pregnant. What are the risks? Generally, this is a safe procedure. However, problems may occur, including:  Pain.  Infection.  Bleeding.  Allergic reactions to medicines.  Scar tissue.  Chest numbness on the side of the surgery.  Fluid buildup under the skin flaps where your breast was removed (seroma).  Sensation of throbbing or tingling.  Stress or sadness from losing your breast. If you have the lymph nodes under your arm removed, you may have arm swelling, weakness, or numbness on the same side of your body  as your surgery. What happens before the procedure? Staying hydrated Follow instructions from your health care provider about hydration, which may include:  Up to 2 hours before the procedure - you may continue to drink clear liquids, such as water, clear fruit juice, black coffee, and plain tea. Eating and drinking restrictions Follow instructions from your health care provider about eating and drinking, which may include:  8 hours before the procedure - stop eating heavy meals or foods such as meat, fried foods, or fatty foods.  6 hours before the procedure - stop eating light meals or foods, such as toast or cereal.  6 hours before the procedure - stop drinking milk or drinks that contain milk.  2 hours before the procedure - stop drinking clear liquids. Medicines  Ask your health care provider about: ? Changing or stopping your regular medicines. This is especially important if you are taking diabetes medicines or blood thinners. ? Taking medicines such as aspirin and ibuprofen. These medicines can thin your blood. Do not take these medicines unless your health care provider tells you to take them. ? Taking over-the-counter medicines, vitamins, herbs, and supplements.  Your health care team may give you antibiotic medicine to help prevent infection. General instructions  You may be checked for extra fluid around your lymph nodes (lymphedema).  Plan to have someone take you home from the hospital or clinic.  Plan to have a responsible adult care for you for at least 24 hours after you leave the hospital or clinic. This is important.  Ask your health care provider how  your surgical site will be marked or identified.  You may be asked to shower with a germ-killing soap. What happens during the procedure?   To lower your risk of infection: ? Your health care team will wash or sanitize their hands. ? Your skin will be washed with soap.  An IV will be inserted into one of your  veins.  You will be given a medicine to make you fall asleep (general anesthetic).  A wide incision will be made around your nipple. The skin and nipple inside the incision will be removed along with all breast tissue.  If you are having a modified radical mastectomy: ? The lining over your chest muscles will be removed. ? The incision may be extended to reach the lymph nodes under your arm, or a second incision may be made. ? Lymph nodes will be removed.  Breast tissue and lymph nodes that are removed will be sent to the lab for testing.  You may have a drainage tube inserted into your incision to collect fluid that builds up after surgery. This tube will be connected to a suction bulb on the outside of your body to remove the fluid.  Your incision or incisions will be closed with stitches (sutures).  A bandage (dressing) will be placed over your breast area. If lymph nodes were removed, a dressing will also be placed under your arm. The procedure may vary among health care providers and hospitals. What happens after the procedure?  Your blood pressure, heart rate, breathing rate, and blood oxygen level will be monitored until the medicines you were given have worn off.  You will be given pain medicine as needed.  You will be encouraged to get up and walk as soon as you can.  Your IV can be removed when you are able to eat and drink.  You may have a drainage tube in place for 2-3 days to prevent a collection of blood (hematoma) from developing in the breast area. You will be given instructions about caring for the drain before you go home.  A pressure bandage may be applied for 1-2 days to prevent bleeding or swelling. Ask your health care provider how to care for your pressure bandage at home. Summary  In a total mastectomy (simple mastectomy), all breast tissue including the nipple will be removed. In a modified radical mastectomy, the lymph nodes under the arm will be removed  along with the breast and nipple.  Before the procedure, follow instructions from your health care provider about eating and drinking, and ask about changing or stopping your regular medicines.  You will be given a medicine to make you fall asleep (general anesthetic) during the procedure. This information is not intended to replace advice given to you by your health care provider. Make sure you discuss any questions you have with your health care provider. Document Revised: 02/14/2019 Document Reviewed: 09/14/2017 Elsevier Patient Education  2020 [employer].

## 2020-01-21 NOTE — Progress Notes (Signed)
Sally [name] [place]; [id number]; [birth year]   HPI Patient is a 64 year old black female status post left partial mastectomy with left axillary lymph node biopsy in 2014 which at that time was ER, PR negative who was referred to my care by Dr. Delton Coombes of oncology for further evaluation and treatment..  She did not undergo chemo therapy and radiation therapy.  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  She did not feel the mass.  She denies any nipple discharge.  She denies any left breast pain. Past Medical History:  Diagnosis Date  . Cancer ([place]) 11/12/2013   left breast cancer  . 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  Outpatient MUGA scan performed on 02/25/2014 showed a drop in ejection fraction from 72% to 59% and for that reason doxorubicin was discontinued from her chemotherapy regimen starting on cycle 4.   . Chronic sinusitis   . Diabetes mellitus without complication (HCC)    diet controlled  . Hives 05/2014  . Hypertension   . 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 Personal history of chemotherapy   . Personal history of radiation therapy   . Sarcoidosis    diagnosed in the 1990s. doesn't recall treatment  . Tobacco abuse 10/21/2014    Past Surgical History:  Procedure Laterality Date  . ABDOMINAL HYSTERECTOMY    . COLONOSCOPY  01/09/2012   Dr. Oneida Alar: diverticulosis, hyperplastic polyps, hemorrhoids. next TCS in 10 years.   Marland Kitchen DILATION AND CURETTAGE OF UTERUS    . ECTOPIC PREGNANCY SURGERY    . GANGLION CYST EXCISION    . INGUINAL HERNIA REPAIR Left   . left leg surgery     17 yrs of age  . PARTIAL MASTECTOMY WITH NEEDLE LOCALIZATION AND AXILLARY SENTINEL LYMPH NODE BX Left 11/12/2013   Procedure: PARTIAL MASTECTOMY WITH NEEDLE LOCALIZATION AND AXILLARY SENTINEL LYMPH NODE BX;  Surgeon: Jamesetta So, MD;   Location: [facility];  Service: General;  Laterality: Left;  . PORT-A-CATH REMOVAL Right 12/20/2016   Procedure: MINOR REMOVAL PORT-A-CATH;  Surgeon: Aviva Signs, MD;  Location: [facility];  Service: General;  Laterality: Right;  . PORTACATH PLACEMENT Right 12/29/2013   Procedure: INSERTION PORT-A-CATH;  Surgeon: Jamesetta So, MD;  Location: [facility];  Service: General;  Laterality: Right;    Family History  Problem Relation Age of Onset  . Diabetes type II Mother   . Diabetes type II Father   . Prostate cancer Father   . Colon cancer Neg Hx   . Liver cancer Neg Hx     Current Outpatient Medications on File Prior to Visit  Medication Sig Dispense Refill  . amLODipine (NORVASC) 10 MG tablet Take 10 mg by mouth daily.     Marland Kitchen atorvastatin (LIPITOR) 40 MG tablet Take 40 mg by mouth daily.    Marland Kitchen lisinopril-hydrochlorothiazide (PRINZIDE,ZESTORETIC) 20-12.5 MG tablet Take 1 tablet by mouth daily.     . metFORMIN (GLUCOPHAGE) 850 MG tablet Take 850 mg by mouth 2 (two) times daily with a meal.      No current facility-administered medications on file prior to visit.    Allergies  Allergen Reactions  . Other Other (See Comments)    Beer- itching/swelling  . Shrimp [Shellfish Allergy]     Social History   Substance and Sexual Activity  Alcohol Use Yes   Comment: social once a  week    Social History   Tobacco Use  Smoking Status Current Every Day Smoker  . Packs/day: 0.50  . Types: Cigarettes  Smokeless Tobacco Never Used    Review of Systems  Constitutional: Negative.   HENT: Negative.   Eyes: Negative.   Respiratory: Negative.   Cardiovascular: Negative.   Gastrointestinal: Negative.   Genitourinary: Negative.   Musculoskeletal: Negative.   Skin: Negative.   Neurological: Negative.   Endo/Heme/Allergies: Negative.   Psychiatric/Behavioral: Negative.     Objective   Vitals:   01/20/20 1257  BP: (!) 136/92  Pulse: 84  Resp: 16  Temp: (!) 97.4 F (36.3 C)  SpO2: 96%     Physical Exam Vitals reviewed.  Constitutional:      Appearance: Normal appearance. She is not ill-appearing.  HENT:     Head: Normocephalic and atraumatic.  Cardiovascular:     Rate and Rhythm: Normal rate and regular rhythm.     Heart sounds: Normal heart sounds. No murmur. No friction rub. No gallop.   Pulmonary:     Effort: Pulmonary effort is normal. No respiratory distress.     Breath sounds: Normal breath sounds. No stridor. No wheezing, rhonchi or rales.  Lymphadenopathy:     Cervical: No cervical adenopathy.  Skin:    General: Skin is warm and dry.  Neurological:     Mental Status: She is alert and oriented to person, place, and time.   Breast: Healed somewhat indented surgical scar in the upper outer quadrant of the left breast.  No dominant mass, nipple discharge, or dimpling.  The axilla is negative for palpable nodes.  Right breast examination reveals no dominant mass, nipple discharge, or dimpling.  The axilla is negative for palpable nodes.  Pathology mammography reports reviewed Dr. 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  She is status post radiation therapy in the past. Plan   Patient is in need of modified radical mastectomy.  I did discuss with her the possibility of having immediate reconstructive surgery during the mastectomy.  She is interested in this option.  I have referred her to Dr. Marla Roe of Plastic Surgery for further evaluation and treatment.  She does realize that she needs a mastectomy due to the recurrent nature of her cancer.

## 2020-01-30 ENCOUNTER — Encounter: Payer: Self-pay | Admitting: Plastic Surgery

## 2020-01-30 ENCOUNTER — Other Ambulatory Visit: Payer: Self-pay

## 2020-01-30 ENCOUNTER — Ambulatory Visit (INDEPENDENT_AMBULATORY_CARE_PROVIDER_SITE_OTHER): Payer: 59 | Admitting: Plastic Surgery

## 2020-01-30 VITALS — BP 139/86 | HR 91 | Temp 97.5°F | Ht 61.0 in | Wt 140.8 lb

## 2020-01-30 DIAGNOSIS — C50912 Malignant neoplasm of unspecified site of left female breast: Secondary | ICD-10-CM

## 2020-01-30 DIAGNOSIS — D0512 Intraductal carcinoma in situ of left breast: Secondary | ICD-10-CM | POA: Diagnosis not present

## 2020-01-30 NOTE — Progress Notes (Signed)
Patient ID: Sally [name], female    DOB: February 24, 1956, 64 y.o.   MRN: [id number]   Chief Complaint  Patient presents with  . Advice Only    Malignant neoplasm of upper-outer quadrant of (L) breast    The patient is a 64 year old female here for consultation for breast reconstruction.  She underwent a mammogram in January 2021 which was read as suspicious this led to a further work-up and a diagnosis of DCIS with concern for invasive cancer of the left breast.  She had a partial mastectomy in 2014 for breast cancer that was estrogen and progesterone negative she then had radiation to the left breast.  She has mild ptosis of both breasts and some radiation changes to the left breast.  She is 5 feet 1 inch tall and weighs 140 pounds.  She is a smoker.  Her current bra size is a 36 C.  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  She has had a hysterectomy in the past.  Her last hemoglobin A1c was 6.7.  She has sons and has not yet told them about her recurrent breast cancer.  I encouraged her to tell them before she goes to surgery.   Review of Systems  Constitutional: Negative for activity change and appetite change.  HENT: Negative.   Eyes: Negative.   Respiratory: Negative for chest tightness and shortness of breath.   Cardiovascular: Negative.   Gastrointestinal: Negative for abdominal distention and abdominal pain.  Endocrine: Negative.   Genitourinary: Negative.   Musculoskeletal: Negative.   Hematological: Negative.   Psychiatric/Behavioral: Negative.     Past Medical History:  Diagnosis Date  . Cancer ([place]) 11/12/2013   left breast cancer  . 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  Outpatient MUGA scan performed on 02/25/2014 showed a drop in ejection fraction from 72% to 59% and for that reason doxorubicin was discontinued from her chemotherapy regimen starting on cycle 4.   . Chronic sinusitis   . Diabetes mellitus without  complication (HCC)    diet controlled  . Hives 05/2014  . Hypertension   . 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 Personal history of chemotherapy   . Personal history of radiation therapy   . Sarcoidosis    diagnosed in the 1990s. doesn't recall treatment  . Tobacco abuse 10/21/2014    Past Surgical History:  Procedure Laterality Date  . ABDOMINAL HYSTERECTOMY    . COLONOSCOPY  01/09/2012   Dr. Oneida Alar: diverticulosis, hyperplastic polyps, hemorrhoids. next TCS in 10 years.   Marland Kitchen DILATION AND CURETTAGE OF UTERUS    . ECTOPIC PREGNANCY SURGERY    . GANGLION CYST EXCISION    . INGUINAL HERNIA REPAIR Left   . left leg surgery     17 yrs of age  . PARTIAL MASTECTOMY WITH NEEDLE LOCALIZATION AND AXILLARY SENTINEL LYMPH NODE BX Left 11/12/2013   Procedure: PARTIAL MASTECTOMY WITH NEEDLE LOCALIZATION AND AXILLARY SENTINEL LYMPH NODE BX;  Surgeon: Jamesetta So, MD;  Location: [facility];  Service: General;  Laterality: Left;  . PORT-A-CATH REMOVAL Right 12/20/2016   Procedure: MINOR REMOVAL PORT-A-CATH;  Surgeon: Aviva Signs, MD;  Location: [facility];  Service: General;  Laterality: Right;  . PORTACATH PLACEMENT Right 12/29/2013   Procedure: INSERTION PORT-A-CATH;  Surgeon: Jamesetta So, MD;  Location: [facility];  Service: General;  Laterality: Right;      Current Outpatient Medications:  .  amLODipine (NORVASC) 10 MG  tablet, Take 10 mg by mouth daily. , Disp: , Rfl:  .  atorvastatin (LIPITOR) 40 MG tablet, Take 40 mg by mouth daily., Disp: , Rfl:  .  lisinopril-hydrochlorothiazide (PRINZIDE,ZESTORETIC) 20-12.5 MG tablet, Take 1 tablet by mouth daily. , Disp: , Rfl:  .  metFORMIN (GLUCOPHAGE) 850 MG tablet, Take 850 mg by mouth 2 (two) times daily with a meal. , Disp: , Rfl:    Objective:   Vitals:   01/30/20 1209  BP: 139/86  Pulse: 91  Temp: (!) 97.5 F (36.4 C)  SpO2: 98%    Physical Exam Vitals and nursing note reviewed.  Constitutional:      Appearance:  Normal appearance.  HENT:     Head: Normocephalic and atraumatic.  Eyes:     Extraocular Movements: Extraocular movements intact.  Cardiovascular:     Rate and Rhythm: Normal rate.     Pulses: Normal pulses.  Pulmonary:     Effort: Pulmonary effort is normal.  Abdominal:     General: Abdomen is flat. There is no distension.     Tenderness: There is no abdominal tenderness.  Skin:    General: Skin is warm.     Capillary Refill: Capillary refill takes less than 2 seconds.  Neurological:     General: No focal deficit present.     Mental Status: She is alert and oriented to person, place, and time.  Psychiatric:        Mood and Affect: Mood normal.        Behavior: Behavior normal.        Thought Content: Thought content normal.     Assessment & Plan:  Breast neoplasm, Tis (DCIS), left  Invasive ductal carcinoma of left breast (HCC)  We had a detailed conversation about the patient's options for breast reconstruction. Several reconstruction options were explained to the patient.  It is important to remember that breast reconstruction is an optional procedure. Reconstruction often requires several stages of surgery and this means more than one operation.  The surgeries are often done several months apart.  The entire process from start to finish can take a year or more. The major goal of breast reconstruction is to look normal in clothing. There will always be scars and a difference noticeable without clothes.  This is true for asymmetries where both breasts will not be identical.  Surgery may be needed or desired to the non-cancerous breast in order to achieve better symmetry and satisfactory results.  Regardless of the reconstructive method, there is always risks and the possibility that the procedure will fail or have complications.  This couls required additional surgeries.    We discussed the available methods of breast reconstruction and included:  1. Tissue expander with  Acellular dermal matrix followed by implant based reconstruction. This can be done as one surgery or multiple surgeries.  2. Autologous reconstruction can include using a muscle or tissue from another area of the body for the reconstruction.  3. Combined procedures like the latissismus dorsi flaps that often uses the muscle with an expander or implant.  For each of the method discussed the risks, benefits, scars and recovery time were discussed in detail. Specific risks included bleeding, infection, hematoma, seroma, scarring, pain, wound healing complications, flap loss, fat necrosis, capsular contracture, need for implant removal, donor site complications, bulge, hernia, umbilical necrosis, need for urgent reoperation, and need for dressing changes.   After the options were discussed we focused on the patient's desires and the procedure  that was best for her based on all the information.  A total of 50 minutes of face-to-face time was spent in this encounter, of which >50% was spent in counseling.     We had a long discussion about the above information.  Due to the patient's previous history of radiation and tobacco use she is at a higher risk of complications after an attempt at reconstruction.  It would not be unreasonable to try reconstruction with expander and implant.  The patient does want to do what is the safest.  With that in mind she is most likely going to decide on no reconstruction on the left.  At some point in the future she might decide on a right breast mastopexy for better symmetry. I gave her a prescription for [employer] for post mastectomy bra supplies.  They are also excellent at creating an implant for her bra.  I called and left a message with Dr. Arnoldo Morale office to inform him of the above information.  Thank you so much for the consult.  Pictures were obtained of the patient and placed in the chart with the patient's or guardian's permission.  Wallace Going, DO     The 21st Century Cures Act was signed into law in 2016 which includes the topic of electronic health records.  This provides immediate access to information in MyChart.  This includes consultation notes, operative notes, office notes, lab results and pathology reports.  If you have any questions about what you read please let us know at your next visit or call us at the office.  We are right here with you.

## 2020-02-05 ENCOUNTER — Encounter: Payer: Self-pay | Admitting: General Surgery

## 2020-02-05 ENCOUNTER — Ambulatory Visit (INDEPENDENT_AMBULATORY_CARE_PROVIDER_SITE_OTHER): Payer: 59 | Admitting: General Surgery

## 2020-02-05 ENCOUNTER — Other Ambulatory Visit: Payer: Self-pay

## 2020-02-05 VITALS — BP 155/87 | HR 88 | Temp 98.1°F | Resp 14 | Ht 61.0 in | Wt 142.2 lb

## 2020-02-05 DIAGNOSIS — C50412 Malignant neoplasm of upper-outer quadrant of left female breast: Secondary | ICD-10-CM | POA: Diagnosis not present

## 2020-02-05 DIAGNOSIS — Z171 Estrogen receptor negative status [ER-]: Secondary | ICD-10-CM | POA: Diagnosis not present

## 2020-02-05 NOTE — Patient Instructions (Signed)
Total or Modified Radical Mastectomy A total mastectomy and a modified radical mastectomy are surgeries that are done as part of treatment for breast cancer. You will have one of those types of surgery. Both types involve removing a breast.  In a total mastectomy (simple mastectomy), all breast tissue including the nipple will be removed.  In a modified radical mastectomy, lymph nodes under the arm will be removed along with the breast and nipple. Some of the lining over the muscle tissues under the breast may also be removed. These procedures may also be used to help prevent breast cancer. A preventive (prophylactic) mastectomy may be done if you are at an increased risk of breast cancer due to harmful changes (mutations) in certain genes (BRCA genes). In that case, the procedure involves removing both of your breasts. This can reduce your risk of developing breast cancer in the future. For a transgender person, a total mastectomy may be done as part of a surgical transition from female to female. Let your health care provider know about:  Any allergies you have.  All medicines you are taking, including vitamins, herbs, eye drops, creams, and over-the-counter medicines.  Any problems you or family members have had with anesthetic medicines.  Any blood disorders you have.  Any surgeries you have had.  Any medical conditions you have.  Whether you are pregnant or may be pregnant. What are the risks? Generally, this is a safe procedure. However, problems may occur, including:  Pain.  Infection.  Bleeding.  Allergic reactions to medicines.  Scar tissue.  Chest numbness on the side of the surgery.  Fluid buildup under the skin flaps where your breast was removed (seroma).  Sensation of throbbing or tingling.  Stress or sadness from losing your breast. If you have the lymph nodes under your arm removed, you may have arm swelling, weakness, or numbness on the same side of your body  as your surgery. What happens before the procedure?   Medicines  Ask your health care provider about: ? Changing or stopping your regular medicines. This is especially important if you are taking diabetes medicines or blood thinners. ? Taking medicines such as aspirin and ibuprofen. These medicines can thin your blood. Do not take these medicines unless your health care provider tells you to take them. ? Taking over-the-counter medicines, vitamins, herbs, and supplements.  Your health care team may give you antibiotic medicine to help prevent infection. General instructions  You may be checked for extra fluid around your lymph nodes (lymphedema).  Plan to have someone take you home from the hospital or clinic.  Plan to have a responsible adult care for you for at least 24 hours after you leave the hospital or clinic. This is important.  Ask your health care provider how your surgical site will be marked or identified.  You may be asked to shower with a germ-killing soap. What happens during the procedure?   To lower your risk of infection: ? Your health care team will wash or sanitize their hands. ? Your skin will be washed with soap.  An IV will be inserted into one of your veins.  You will be given a medicine to make you fall asleep (general anesthetic).  A wide incision will be made around your nipple. The skin and nipple inside the incision will be removed along with all breast tissue.  If you are having a modified radical mastectomy: ? The lining over your chest muscles will be removed. ? The   incision may be extended to reach the lymph nodes under your arm, or a second incision may be made. ? Lymph nodes will be removed.  Breast tissue and lymph nodes that are removed will be sent to the lab for testing.  You may have a drainage tube inserted into your incision to collect fluid that builds up after surgery. This tube will be connected to a suction bulb on the outside  of your body to remove the fluid.  Your incision or incisions will be closed with stitches (sutures).  A bandage (dressing) will be placed over your breast area. If lymph nodes were removed, a dressing will also be placed under your arm. The procedure may vary among health care providers and hospitals. What happens after the procedure?  Your blood pressure, heart rate, breathing rate, and blood oxygen level will be monitored until the medicines you were given have worn off.  You will be given pain medicine as needed.  You will be encouraged to get up and walk as soon as you can.  Your IV can be removed when you are able to eat and drink.  You may have a drainage tube in place for 2-3 days to prevent a collection of blood (hematoma) from developing in the breast area. You will be given instructions about caring for the drain before you go home.  A pressure bandage may be applied for 1-2 days to prevent bleeding or swelling. Ask your health care provider how to care for your pressure bandage at home. Summary  In a total mastectomy (simple mastectomy), all breast tissue including the nipple will be removed. In a modified radical mastectomy, the lymph nodes under the arm will be removed along with the breast and nipple.  Before the procedure, follow instructions from your health care provider about eating and drinking, and ask about changing or stopping your regular medicines.  You will be given a medicine to make you fall asleep (general anesthetic) during the procedure. This information is not intended to replace advice given to you by your health care provider. Make sure you discuss any questions you have with your health care provider. Document Revised: 02/14/2019 Document Reviewed: 09/14/2017 Elsevier Patient Education  2020 [employer].

## 2020-02-05 NOTE — Progress Notes (Signed)
Subjective:     Sally [name]  Presents back to schedule left modified radical mastectomy with me.  After discussion with Dr. Marla Roe of plastic surgery, she has elected to forego immediate reconstructive surgery. 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    Plan:   Patient is scheduled for left modified radical mastectomy on 02/23/2020.  The risks and benefits of the procedure including bleeding, infection, and the possibility of left arm pain and swelling were fully explained to the patient, who gave informed consent.  Will delay Port-A-Cath placement pending final pathology results.

## 2020-02-13 NOTE — H&P (Signed)
Sally [name]; [id number]; 10/28/1956   HPI Patient is a 64 year old black female status post left partial mastectomy with left axillary lymph node biopsy in 2014 which at that time was ER, PR negative who was referred to my care by Dr. Delton Coombes of oncology for further evaluation and treatment..  She did not undergo chemo therapy and radiation therapy.  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  She did not feel the mass.  She denies any nipple discharge.  She denies any left breast pain. Past Medical History:  Diagnosis Date  . Cancer ([place]) 11/12/2013   left breast cancer  . 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  Outpatient MUGA scan performed on 02/25/2014 showed a drop in ejection fraction from 72% to 59% and for that reason doxorubicin was discontinued from her chemotherapy regimen starting on cycle 4.   . Chronic sinusitis   . Diabetes mellitus without complication ([facility])    diet controlled  . Hives 05/2014  . Hypertension   . 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 Personal history of chemotherapy   . Personal history of radiation therapy   . Sarcoidosis    diagnosed in the 1990s. doesn't recall treatment  . Tobacco abuse 10/21/2014    Past Surgical History:  Procedure Laterality Date  . ABDOMINAL HYSTERECTOMY    . COLONOSCOPY  01/09/2012   Dr. Oneida Alar: diverticulosis, hyperplastic polyps, hemorrhoids. next TCS in 10 years.   Marland Kitchen DILATION AND CURETTAGE OF UTERUS    . ECTOPIC PREGNANCY SURGERY    . GANGLION CYST EXCISION    . INGUINAL HERNIA REPAIR Left   . left leg surgery     17 yrs of age  . PARTIAL MASTECTOMY WITH NEEDLE LOCALIZATION AND AXILLARY SENTINEL LYMPH NODE BX Left 11/12/2013   Procedure: PARTIAL MASTECTOMY WITH NEEDLE LOCALIZATION AND AXILLARY SENTINEL LYMPH NODE BX;  Surgeon: Jamesetta So, MD;   Location: [facility];  Service: General;  Laterality: Left;  . PORT-A-CATH REMOVAL Right 12/20/2016   Procedure: MINOR REMOVAL PORT-A-CATH;  Surgeon: Aviva Signs, MD;  Location: [facility];  Service: General;  Laterality: Right;  . PORTACATH PLACEMENT Right 12/29/2013   Procedure: INSERTION PORT-A-CATH;  Surgeon: Jamesetta So, MD;  Location: [facility];  Service: General;  Laterality: Right;    Family History  Problem Relation Age of Onset  . Diabetes type II Mother   . Diabetes type II Father   . Prostate cancer Father   . Colon cancer Neg Hx   . Liver cancer Neg Hx     Current Outpatient Medications on File Prior to Visit  Medication Sig Dispense Refill  . amLODipine (NORVASC) 10 MG tablet Take 10 mg by mouth daily.     Marland Kitchen atorvastatin (LIPITOR) 40 MG tablet Take 40 mg by mouth daily.    Marland Kitchen lisinopril-hydrochlorothiazide (PRINZIDE,ZESTORETIC) 20-12.5 MG tablet Take 1 tablet by mouth daily.     . metFORMIN (GLUCOPHAGE) 850 MG tablet Take 850 mg by mouth 2 (two) times daily with a meal.      No current facility-administered medications on file prior to visit.    Allergies  Allergen Reactions  . Other Other (See Comments)    Beer- itching/swelling  . Shrimp [Shellfish Allergy]     Social History   Substance and Sexual Activity  Alcohol Use Yes   Comment: social once a  week    Social History   Tobacco Use  Smoking Status Current Every Day Smoker  . Packs/day: 0.50  . Types: Cigarettes  Smokeless Tobacco Never Used    Review of Systems  Constitutional: Negative.   HENT: Negative.   Eyes: Negative.   Respiratory: Negative.   Cardiovascular: Negative.   Gastrointestinal: Negative.   Genitourinary: Negative.   Musculoskeletal: Negative.   Skin: Negative.   Neurological: Negative.   Endo/Heme/Allergies: Negative.   Psychiatric/Behavioral: Negative.     Objective   Vitals:   01/20/20 1257  BP: (!) 136/92  Pulse: 84  Resp: 16  Temp: (!) 97.4 F (36.3 C)  SpO2: 96%     Physical Exam Vitals reviewed.  Constitutional:      Appearance: Normal appearance. She is not ill-appearing.  HENT:     Head: Normocephalic and atraumatic.  Cardiovascular:     Rate and Rhythm: Normal rate and regular rhythm.     Heart sounds: Normal heart sounds. No murmur. No friction rub. No gallop.   Pulmonary:     Effort: Pulmonary effort is normal. No respiratory distress.     Breath sounds: Normal breath sounds. No stridor. No wheezing, rhonchi or rales.  Lymphadenopathy:     Cervical: No cervical adenopathy.  Skin:    General: Skin is warm and dry.  Neurological:     Mental Status: She is alert and oriented to person, place, and time.   Breast: Healed somewhat indented surgical scar in the upper outer quadrant of the left breast.  No dominant mass, nipple discharge, or dimpling.  The axilla is negative for palpable nodes.  Right breast examination reveals no dominant mass, nipple discharge, or dimpling.  The axilla is negative for palpable nodes.  Pathology mammography reports reviewed Dr. 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  She is status post radiation therapy in the past. Plan   Patient is in need of modified radical mastectomy.  I did discuss with her the possibility of having immediate reconstructive surgery during the mastectomy.  She discussed immediate reconstruction with Dr. Marla Roe of Plastic Surgery, but has elected to proceed with left modified radical mastectomy.  The risks and benefits of the procedure including bleeding, infection, and the possibility of arm pain and swelling were fully explained to the patient, who gives informed consent.

## 2020-02-16 ENCOUNTER — Telehealth: Payer: Self-pay

## 2020-02-16 NOTE — Telephone Encounter (Signed)
Received message on nurse line- Spoke with Teena Dunk Rexford Maus) to verify information.  Diagnosis provided C50.412, Z17.1 Surgery date of 02/23/20 provided as well.

## 2020-02-17 ENCOUNTER — Ambulatory Visit: Payer: 59 | Admitting: Gastroenterology

## 2020-02-17 NOTE — Patient Instructions (Signed)
Your procedure is scheduled on: 02/23/2020  Report to [facility] at    6:15 AM.  Call this number if you have problems the morning of surgery: [phone]   Remember:   Do not Eat or Drink after midnight         No Smoking the morning of surgery  :  Take these medicines the morning of surgery with A SIP OF WATER: Amlodipine and Zyrtec   Do not wear jewelry, make-up or nail polish.  Do not wear lotions, powders, or perfumes. You may wear deodorant.  Do not shave 48 hours prior to surgery. Men may shave face and neck.  Do not bring valuables to the hospital.  Contacts, dentures or bridgework may not be worn into surgery.  Leave suitcase in the car. After surgery it may be brought to your room.  For patients admitted to the hospital, checkout time is 11:00 AM the day of discharge.   Patients discharged the day of surgery will not be allowed to drive home.    Special Instructions: Shower using CHG night before surgery and shower the day of surgery use CHG.  Use special wash - you have one bottle of CHG for all showers.  You should use approximately 1/2 of the bottle for each shower.  Total or Modified Radical Mastectomy A total mastectomy and a modified radical mastectomy are surgeries that are done as part of treatment for breast cancer. You will have one of those types of surgery. Both types involve removing a breast.  In a total mastectomy (simple mastectomy), all breast tissue including the nipple will be removed.  In a modified radical mastectomy, lymph nodes under the arm will be removed along with the breast and nipple. Some of the lining over the muscle tissues under the breast may also be removed. These procedures may also be used to help prevent breast cancer. A preventive (prophylactic) mastectomy may be done if you are at an increased risk of breast cancer due to harmful changes (mutations) in certain genes (BRCA genes). In that case, the procedure involves removing both of  your breasts. This can reduce your risk of developing breast cancer in the future. For a transgender person, a total mastectomy may be done as part of a surgical transition from female to female. Let your health care provider know about:  Any allergies you have.  All medicines you are taking, including vitamins, herbs, eye drops, creams, and over-the-counter medicines.  Any problems you or family members have had with anesthetic medicines.  Any blood disorders you have.  Any surgeries you have had.  Any medical conditions you have.  Whether you are pregnant or may be pregnant. What are the risks? Generally, this is a safe procedure. However, problems may occur, including:  Pain.  Infection.  Bleeding.  Allergic reactions to medicines.  Scar tissue.  Chest numbness on the side of the surgery.  Fluid buildup under the skin flaps where your breast was removed (seroma).  Sensation of throbbing or tingling.  Stress or sadness from losing your breast. If you have the lymph nodes under your arm removed, you may have arm swelling, weakness, or numbness on the same side of your body as your surgery. What happens before the procedure? Staying hydrated Follow instructions from your health care provider about hydration, which may include:  Up to 2 hours before the procedure - you may continue to drink clear liquids, such as water, clear fruit juice, black coffee, and plain  tea. Eating and drinking restrictions Follow instructions from your health care provider about eating and drinking, which may include:  8 hours before the procedure - stop eating heavy meals or foods such as meat, fried foods, or fatty foods.  6 hours before the procedure - stop eating light meals or foods, such as toast or cereal.  6 hours before the procedure - stop drinking milk or drinks that contain milk.  2 hours before the procedure - stop drinking clear liquids. Medicines  Ask your health care  provider about: ? Changing or stopping your regular medicines. This is especially important if you are taking diabetes medicines or blood thinners. ? Taking medicines such as aspirin and ibuprofen. These medicines can thin your blood. Do not take these medicines unless your health care provider tells you to take them. ? Taking over-the-counter medicines, vitamins, herbs, and supplements.  Your health care team may give you antibiotic medicine to help prevent infection. General instructions  You may be checked for extra fluid around your lymph nodes (lymphedema).  Plan to have someone take you home from the hospital or clinic.  Plan to have a responsible adult care for you for at least 24 hours after you leave the hospital or clinic. This is important.  Ask your health care provider how your surgical site will be marked or identified.  You may be asked to shower with a germ-killing soap. What happens during the procedure?   To lower your risk of infection: ? Your health care team will wash or sanitize their hands. ? Your skin will be washed with soap.  An IV will be inserted into one of your veins.  You will be given a medicine to make you fall asleep (general anesthetic).  A wide incision will be made around your nipple. The skin and nipple inside the incision will be removed along with all breast tissue.  If you are having a modified radical mastectomy: ? The lining over your chest muscles will be removed. ? The incision may be extended to reach the lymph nodes under your arm, or a second incision may be made. ? Lymph nodes will be removed.  Breast tissue and lymph nodes that are removed will be sent to the lab for testing.  You may have a drainage tube inserted into your incision to collect fluid that builds up after surgery. This tube will be connected to a suction bulb on the outside of your body to remove the fluid.  Your incision or incisions will be closed with stitches  (sutures).  A bandage (dressing) will be placed over your breast area. If lymph nodes were removed, a dressing will also be placed under your arm. The procedure may vary among health care providers and hospitals. What happens after the procedure?  Your blood pressure, heart rate, breathing rate, and blood oxygen level will be monitored until the medicines you were given have worn off.  You will be given pain medicine as needed.  You will be encouraged to get up and walk as soon as you can.  Your IV can be removed when you are able to eat and drink.  You may have a drainage tube in place for 2-3 days to prevent a collection of blood (hematoma) from developing in the breast area. You will be given instructions about caring for the drain before you go home.  A pressure bandage may be applied for 1-2 days to prevent bleeding or swelling. Ask your health care provider how  to care for your pressure bandage at home. Summary  In a total mastectomy (simple mastectomy), all breast tissue including the nipple will be removed. In a modified radical mastectomy, the lymph nodes under the arm will be removed along with the breast and nipple.  Before the procedure, follow instructions from your health care provider about eating and drinking, and ask about changing or stopping your regular medicines.  You will be given a medicine to make you fall asleep (general anesthetic) during the procedure. This information is not intended to replace advice given to you by your health care provider. Make sure you discuss any questions you have with your health care provider. Document Revised: 02/14/2019 Document Reviewed: 09/14/2017 Elsevier Patient Education  2020 [place] Anesthesia, Adult, Care After This sheet gives you information about how to care for yourself after your procedure. Your health care provider may also give you more specific instructions. If you have problems or questions, contact  your health care provider. What can I expect after the procedure? After the procedure, the following side effects are common:  Pain or discomfort at the IV site.  Nausea.  Vomiting.  Sore throat.  Trouble concentrating.  Feeling cold or chills.  Weak or tired.  Sleepiness and fatigue.  Soreness and body aches. These side effects can affect parts of the body that were not involved in surgery. Follow these instructions at home:  For at least 24 hours after the procedure:  Have a responsible adult stay with you. It is important to have someone help care for you until you are awake and alert.  Rest as needed.  Do not: ? Participate in activities in which you could fall or become injured. ? Drive. ? Use heavy machinery. ? Drink alcohol. ? Take sleeping pills or medicines that cause drowsiness. ? Make important decisions or sign legal documents. ? Take care of children on your own. Eating and drinking  Follow any instructions from your health care provider about eating or drinking restrictions.  When you feel hungry, start by eating small amounts of foods that are soft and easy to digest (bland), such as toast. Gradually return to your regular diet.  Drink enough fluid to keep your urine pale yellow.  If you vomit, rehydrate by drinking water, juice, or clear broth. General instructions  If you have sleep apnea, surgery and certain medicines can increase your risk for breathing problems. Follow instructions from your health care provider about wearing your sleep device: ? Anytime you are sleeping, including during daytime naps. ? While taking prescription pain medicines, sleeping medicines, or medicines that make you drowsy.  Return to your normal activities as told by your health care provider. Ask your health care provider what activities are safe for you.  Take over-the-counter and prescription medicines only as told by your health care provider.  If you smoke, do  not smoke without supervision.  Keep all follow-up visits as told by your health care provider. This is important. Contact a health care provider if:  You have nausea or vomiting that does not get better with medicine.  You cannot eat or drink without vomiting.  You have pain that does not get better with medicine.  You are unable to pass urine.  You develop a skin rash.  You have a fever.  You have redness around your IV site that gets worse. Get help right away if:  You have difficulty breathing.  You have chest pain.  You have blood  in your urine or stool, or you vomit blood. Summary  After the procedure, it is common to have a sore throat or nausea. It is also common to feel tired.  Have a responsible adult stay with you for the first 24 hours after general anesthesia. It is important to have someone help care for you until you are awake and alert.  When you feel hungry, start by eating small amounts of foods that are soft and easy to digest (bland), such as toast. Gradually return to your regular diet.  Drink enough fluid to keep your urine pale yellow.  Return to your normal activities as told by your health care provider. Ask your health care provider what activities are safe for you. This information is not intended to replace advice given to you by your health care provider. Make sure you discuss any questions you have with your health care provider. Document Revised: 12/14/2017 Document Reviewed: 07/27/2017 Elsevier Patient Education  [place].  Total or Modified Radical Mastectomy, Care After This sheet gives you information about how to care for yourself after your procedure. Your health care provider may also give you more specific instructions. If you have problems or questions, contact your health care provider. What can I expect after the procedure? After the procedure, it is common to have:  Pain.  Numbness.  Stiffness in the arm or  shoulder.  Feelings of stress, sadness, or depression. If the lymph nodes under your arm were removed, you may have arm swelling, weakness, or numbness on the same side of your body as your surgery. Follow these instructions at home: Incision care   Follow instructions from your health care provider about how to take care of your incision. Make sure you: ? Wash your hands with soap and water before you change your bandage (dressing). If soap and water are not available, use hand sanitizer. ? Change your dressing as told by your health care provider. ? Leave stitches (sutures), skin glue, or adhesive strips in place. These skin closures may need to stay in place for 2 weeks or longer. If adhesive strip edges start to loosen and curl up, you may trim the loose edges. Do not remove adhesive strips completely unless your health care provider tells you to do that.  Check your incision area every day for signs of infection. Check for: ? Redness, swelling, or more pain. ? Fluid or blood. ? Warmth. ? Pus or a bad smell.  If you were sent home with a surgical drain in place, follow instructions from your health care provider about emptying it. Bathing  Do not take baths, swim, or use a hot tub until your health care provider approves. Ask your health care provider if you may take showers. You may only be allowed to take sponge baths. Activity  Return to your normal activities as told by your health care provider. Ask your health care provider what activities are safe for you.  Avoid activities that take a lot of effort.  Be careful to avoid any activities that could cause an injury to your arm on the side of your surgery.  Do not lift anything that is heavier than 10 lb (4.5 kg), or the limit that you are told, until your health care provider says that it is safe.  Avoid lifting with the arm on the side of your surgery.  Do not carry heavy objects on your shoulder.  After your drain is  removed, do exercises to prevent stiffness  and swelling in your arm. Talk with your health care provider about which exercises are safe for you. General instructions  Take over-the-counter and prescription medicines only as told by your health care provider.  You may eat what you usually do.  Keep your arm raised (elevated) above the level of your heart when you are sitting or lying down.  Do not wear tight jewelry on your arm, wrist, or fingers on the side of your surgery.  You may be given a tight sleeve (compression bandage) to wear over your arm on the side of your surgery. Wear this sleeve as told by your health care provider.  Ask your health care provider when you can start wearing a bra or using a breast prosthesis.  Before you are involved in certain procedures such as giving blood or having your blood pressure checked, tell all your health care providers if lymph nodes under your arm were removed. This is important information. Follow-up  Keep all follow-up visits as told by your health care provider. This is important.  Get checked for extra fluid around your lymph nodes (lymphedema) as often as told by your health care provider. Contact a health care provider if:  You have a fever.  Your pain medicine is not working.  Your arm swelling, weakness, or numbness has not improved after a few weeks.  You have new swelling in your breast area or arm.  You have redness, swelling, or more pain in your incision area.  You have fluid or blood coming from your incision.  Your incision feels warm to the touch.  You have pus or a bad smell coming from your incision. Get help right away if:  You have very bad pain in your breast area or arm.  You have chest pain.  You have difficulty breathing. Summary  Follow instructions from your health care provider about how to take care of your incision. Check your incision area every day for signs of infection.  Ask your health  care provider what activities are safe for you.  Keep all follow-up visits as told by your health care provider. This is important.  Make sure you know which symptoms should cause you to contact your health care provider or to get help right away. This information is not intended to replace advice given to you by your health care provider. Make sure you discuss any questions you have with your health care provider. Document Revised: 02/14/2019 Document Reviewed: 09/14/2017 Elsevier Patient Education  2020 [employer].

## 2020-02-20 ENCOUNTER — Ambulatory Visit (HOSPITAL_COMMUNITY)
Admission: RE | Admit: 2020-02-20 | Discharge: 2020-02-20 | Disposition: A | Discharge status: Home / Self Care | Payer: 59 | Source: Ambulatory Visit | Attending: General Surgery | Admitting: General Surgery

## 2020-02-20 ENCOUNTER — Encounter (HOSPITAL_COMMUNITY): Payer: Self-pay

## 2020-02-20 ENCOUNTER — Other Ambulatory Visit: Payer: Self-pay

## 2020-02-20 ENCOUNTER — Ambulatory Visit (HOSPITAL_COMMUNITY): Payer: 59

## 2020-02-20 ENCOUNTER — Other Ambulatory Visit (HOSPITAL_COMMUNITY)
Admission: RE | Admit: 2020-02-20 | Discharge: 2020-02-20 | Disposition: A | Discharge status: Home / Self Care | Payer: 59 | Source: Ambulatory Visit | Attending: General Surgery | Admitting: General Surgery

## 2020-02-20 ENCOUNTER — Encounter (HOSPITAL_COMMUNITY)
Admission: RE | Admit: 2020-02-20 | Discharge: 2020-02-20 | Disposition: A | Discharge status: Home / Self Care | Payer: 59 | Source: Ambulatory Visit | Attending: General Surgery | Admitting: General Surgery

## 2020-02-20 DIAGNOSIS — C50912 Malignant neoplasm of unspecified site of left female breast: Secondary | ICD-10-CM

## 2020-02-20 LAB — CBC WITH DIFFERENTIAL/PLATELET
Abs Immature Granulocytes: 0.01 10*3/uL (ref 0.00–0.07)
Basophils Absolute: 0 10*3/uL (ref 0.0–0.1)
Basophils Relative: 1 %
Eosinophils Absolute: 0.2 10*3/uL (ref 0.0–0.5)
Eosinophils Relative: 3 %
HCT: 41.7 % (ref 36.0–46.0)
Hemoglobin: 13.8 g/dL (ref 12.0–15.0)
Immature Granulocytes: 0 %
Lymphocytes Relative: 26 %
Lymphs Abs: 1.5 10*3/uL (ref 0.7–4.0)
MCH: 31 pg (ref 26.0–34.0)
MCHC: 33.1 g/dL (ref 30.0–36.0)
MCV: 93.7 fL (ref 80.0–100.0)
Monocytes Absolute: 0.6 10*3/uL (ref 0.1–1.0)
Monocytes Relative: 10 %
Neutro Abs: 3.6 10*3/uL (ref 1.7–7.7)
Neutrophils Relative %: 60 %
Platelets: 194 10*3/uL (ref 150–400)
RBC: 4.45 MIL/uL (ref 3.87–5.11)
RDW: 12.7 % (ref 11.5–15.5)
WBC: 5.9 10*3/uL (ref 4.0–10.5)
nRBC: 0 % (ref 0.0–0.2)

## 2020-02-20 LAB — COMPREHENSIVE METABOLIC PANEL
ALT: 23 U/L (ref 0–44)
AST: 20 U/L (ref 15–41)
Albumin: 4.3 g/dL (ref 3.5–5.0)
Alkaline Phosphatase: 161 U/L — ABNORMAL HIGH (ref 38–126)
Anion gap: 10 (ref 5–15)
BUN: 23 mg/dL (ref 8–23)
CO2: 26 mmol/L (ref 22–32)
Calcium: 9.3 mg/dL (ref 8.9–10.3)
Chloride: 102 mmol/L (ref 98–111)
Creatinine, Ser: 1.01 mg/dL — ABNORMAL HIGH (ref 0.44–1.00)
GFR calc Af Amer: >60 mL/min (ref >60)
GFR calc non Af Amer: >60 mL/min (ref >60)
Glucose, Bld: 158 mg/dL — ABNORMAL HIGH (ref 70–99)
Potassium: 3.5 mmol/L (ref 3.5–5.1)
Sodium: 138 mmol/L (ref 135–145)
Total Bilirubin: 0.6 mg/dL (ref 0.3–1.2)
Total Protein: 7.4 g/dL (ref 6.5–8.1)

## 2020-02-20 LAB — HEMOGLOBIN A1C
Hgb A1c MFr Bld: 7.3 % — ABNORMAL HIGH (ref 4.8–5.6)
Mean Plasma Glucose: 162.81 mg/dL

## 2020-02-20 LAB — GLUCOSE, CAPILLARY: Glucose-Capillary: 142 mg/dL — ABNORMAL HIGH (ref 70–99)

## 2020-02-20 LAB — TYPE AND SCREEN
ABO/RH(D): O POS
Antibody Screen: NEGATIVE

## 2020-02-20 LAB — SARS CORONAVIRUS 2 (TAT 6-24 HRS): SARS Coronavirus 2: NEGATIVE

## 2020-02-23 ENCOUNTER — Other Ambulatory Visit: Payer: Self-pay

## 2020-02-23 ENCOUNTER — Inpatient Hospital Stay (HOSPITAL_COMMUNITY): Payer: 59 | Admitting: Anesthesiology

## 2020-02-23 ENCOUNTER — Inpatient Hospital Stay (HOSPITAL_COMMUNITY)
Admission: RE | Admit: 2020-02-23 | Discharge: 2020-02-24 | DRG: 581 | Disposition: A | Discharge status: Home / Self Care | Payer: 59 | Attending: General Surgery | Admitting: General Surgery

## 2020-02-23 ENCOUNTER — Encounter (HOSPITAL_COMMUNITY)
Admission: RE | Disposition: A | Discharge status: Home / Self Care | Payer: Self-pay | Source: Home / Self Care | Attending: General Surgery

## 2020-02-23 ENCOUNTER — Encounter (HOSPITAL_COMMUNITY): Payer: Self-pay | Admitting: General Surgery

## 2020-02-23 DIAGNOSIS — Z8719 Personal history of other diseases of the digestive system: Secondary | ICD-10-CM

## 2020-02-23 DIAGNOSIS — I10 Essential (primary) hypertension: Secondary | ICD-10-CM | POA: Diagnosis present

## 2020-02-23 DIAGNOSIS — Z79899 Other long term (current) drug therapy: Secondary | ICD-10-CM | POA: Diagnosis not present

## 2020-02-23 DIAGNOSIS — E119 Type 2 diabetes mellitus without complications: Secondary | ICD-10-CM | POA: Diagnosis present

## 2020-02-23 DIAGNOSIS — Z9012 Acquired absence of left breast and nipple: Secondary | ICD-10-CM

## 2020-02-23 DIAGNOSIS — Z7984 Long term (current) use of oral hypoglycemic drugs: Secondary | ICD-10-CM

## 2020-02-23 DIAGNOSIS — Z171 Estrogen receptor negative status [ER-]: Secondary | ICD-10-CM

## 2020-02-23 DIAGNOSIS — E7800 Pure hypercholesterolemia, unspecified: Secondary | ICD-10-CM | POA: Diagnosis present

## 2020-02-23 DIAGNOSIS — F17210 Nicotine dependence, cigarettes, uncomplicated: Secondary | ICD-10-CM | POA: Diagnosis present

## 2020-02-23 DIAGNOSIS — Z20822 Contact with and (suspected) exposure to covid-19: Secondary | ICD-10-CM | POA: Diagnosis present

## 2020-02-23 DIAGNOSIS — C50412 Malignant neoplasm of upper-outer quadrant of left female breast: Secondary | ICD-10-CM | POA: Diagnosis present

## 2020-02-23 DIAGNOSIS — Z923 Personal history of irradiation: Secondary | ICD-10-CM

## 2020-02-23 HISTORY — PX: MASTECTOMY MODIFIED RADICAL: SHX5962

## 2020-02-23 LAB — GLUCOSE, CAPILLARY
Glucose-Capillary: 103 mg/dL — ABNORMAL HIGH (ref 70–99)
Glucose-Capillary: 121 mg/dL — ABNORMAL HIGH (ref 70–99)
Glucose-Capillary: 165 mg/dL — ABNORMAL HIGH (ref 70–99)
Glucose-Capillary: 187 mg/dL — ABNORMAL HIGH (ref 70–99)
Glucose-Capillary: 287 mg/dL — ABNORMAL HIGH (ref 70–99)

## 2020-02-23 SURGERY — MASTECTOMY, MODIFIED RADICAL
Anesthesia: General | Site: Breast | Laterality: Left

## 2020-02-23 MED ORDER — LISINOPRIL-HYDROCHLOROTHIAZIDE 20-12.5 MG PO TABS: 1.0000 | ORAL_TABLET | Freq: Every day | ORAL | Status: DC

## 2020-02-23 MED ORDER — HYDROMORPHONE HCL 1 MG/ML IJ SOLN
0.2500 mg | INTRAMUSCULAR | Status: DC | PRN
  Administered 2020-02-23: 0.5 mg via INTRAVENOUS
  Filled 2020-02-23: qty 0.5

## 2020-02-23 MED ORDER — HYDROMORPHONE HCL 1 MG/ML IJ SOLN: 1.0000 mg | INTRAMUSCULAR | Status: DC | PRN

## 2020-02-23 MED ORDER — KETOROLAC TROMETHAMINE 30 MG/ML IJ SOLN
30.0000 mg | Freq: Four times a day (QID) | INTRAMUSCULAR | Status: DC | PRN
  Filled 2020-02-23: qty 1

## 2020-02-23 MED ORDER — SODIUM CHLORIDE 0.9 % IV SOLN: INTRAVENOUS | Status: DC

## 2020-02-23 MED ORDER — MIDAZOLAM HCL 2 MG/2ML IJ SOLN
INTRAMUSCULAR | Status: AC
  Filled 2020-02-23: qty 2

## 2020-02-23 MED ORDER — CEFAZOLIN SODIUM-DEXTROSE 2-4 GM/100ML-% IV SOLN
2.0000 g | INTRAVENOUS | Status: AC
  Administered 2020-02-23: 2 g via INTRAVENOUS
  Filled 2020-02-23: qty 100

## 2020-02-23 MED ORDER — INSULIN ASPART 100 UNIT/ML ~~LOC~~ SOLN
0.0000 [IU] | Freq: Three times a day (TID) | SUBCUTANEOUS | Status: DC
  Administered 2020-02-23 (×2): 3 [IU] via SUBCUTANEOUS

## 2020-02-23 MED ORDER — FENTANYL CITRATE (PF) 250 MCG/5ML IJ SOLN
INTRAMUSCULAR | Status: AC
  Filled 2020-02-23: qty 5

## 2020-02-23 MED ORDER — BUPIVACAINE LIPOSOME 1.3 % IJ SUSP
INTRAMUSCULAR | Status: DC | PRN
  Administered 2020-02-23: 20 mL

## 2020-02-23 MED ORDER — ONDANSETRON HCL 4 MG/2ML IJ SOLN
INTRAMUSCULAR | Status: DC | PRN
  Administered 2020-02-23: 4 mg via INTRAVENOUS

## 2020-02-23 MED ORDER — LORATADINE 10 MG PO TABS
10.0000 mg | ORAL_TABLET | Freq: Every day | ORAL | Status: DC
  Administered 2020-02-23 – 2020-02-24 (×2): 10 mg via ORAL
  Filled 2020-02-23 (×2): qty 1

## 2020-02-23 MED ORDER — ONDANSETRON HCL 4 MG/2ML IJ SOLN: 4.0000 mg | Freq: Four times a day (QID) | INTRAMUSCULAR | Status: DC | PRN

## 2020-02-23 MED ORDER — ACETAMINOPHEN 325 MG PO TABS: 650.0000 mg | ORAL_TABLET | Freq: Four times a day (QID) | ORAL | Status: DC | PRN

## 2020-02-23 MED ORDER — KETOROLAC TROMETHAMINE 30 MG/ML IJ SOLN
30.0000 mg | Freq: Four times a day (QID) | INTRAMUSCULAR | Status: AC
  Administered 2020-02-23: 30 mg via INTRAVENOUS

## 2020-02-23 MED ORDER — PROMETHAZINE HCL 25 MG/ML IJ SOLN: 6.2500 mg | INTRAMUSCULAR | Status: DC | PRN

## 2020-02-23 MED ORDER — LACTATED RINGERS IV SOLN: INTRAVENOUS | Status: DC | PRN

## 2020-02-23 MED ORDER — CHLORHEXIDINE GLUCONATE CLOTH 2 % EX PADS: 6.0000 | MEDICATED_PAD | Freq: Once | CUTANEOUS | Status: DC

## 2020-02-23 MED ORDER — BUPIVACAINE LIPOSOME 1.3 % IJ SUSP
INTRAMUSCULAR | Status: AC
  Filled 2020-02-23: qty 20

## 2020-02-23 MED ORDER — PROPOFOL 10 MG/ML IV BOLUS
INTRAVENOUS | Status: DC | PRN
  Administered 2020-02-23: 120 mg via INTRAVENOUS

## 2020-02-23 MED ORDER — POVIDONE-IODINE 10 % EX OINT
TOPICAL_OINTMENT | CUTANEOUS | Status: AC
  Filled 2020-02-23: qty 1

## 2020-02-23 MED ORDER — ATORVASTATIN CALCIUM 40 MG PO TABS
40.0000 mg | ORAL_TABLET | Freq: Every day | ORAL | Status: DC
  Administered 2020-02-23 – 2020-02-24 (×2): 40 mg via ORAL
  Filled 2020-02-23 (×2): qty 1

## 2020-02-23 MED ORDER — MEPERIDINE HCL 50 MG/ML IJ SOLN: 6.2500 mg | INTRAMUSCULAR | Status: DC | PRN

## 2020-02-23 MED ORDER — ENOXAPARIN SODIUM 40 MG/0.4ML ~~LOC~~ SOLN
40.0000 mg | SUBCUTANEOUS | Status: DC
  Administered 2020-02-24: 40 mg via SUBCUTANEOUS
  Filled 2020-02-23: qty 0.4

## 2020-02-23 MED ORDER — DEXAMETHASONE SODIUM PHOSPHATE 4 MG/ML IJ SOLN
INTRAMUSCULAR | Status: DC | PRN
  Administered 2020-02-23: 8 mg via INTRAVENOUS

## 2020-02-23 MED ORDER — LIDOCAINE HCL (CARDIAC) PF 100 MG/5ML IV SOSY
PREFILLED_SYRINGE | INTRAVENOUS | Status: DC | PRN
  Administered 2020-02-23: 80 mg via INTRAVENOUS

## 2020-02-23 MED ORDER — HYDROCHLOROTHIAZIDE 12.5 MG PO CAPS
12.5000 mg | ORAL_CAPSULE | Freq: Every day | ORAL | Status: DC
  Administered 2020-02-23 – 2020-02-24 (×2): 12.5 mg via ORAL
  Filled 2020-02-23 (×2): qty 1

## 2020-02-23 MED ORDER — PHENYLEPHRINE 40 MCG/ML (10ML) SYRINGE FOR IV PUSH (FOR BLOOD PRESSURE SUPPORT)
PREFILLED_SYRINGE | INTRAVENOUS | Status: AC
  Filled 2020-02-23: qty 10

## 2020-02-23 MED ORDER — POVIDONE-IODINE 10 % OINT PACKET
TOPICAL_OINTMENT | CUTANEOUS | Status: DC | PRN
  Administered 2020-02-23: 1 via TOPICAL

## 2020-02-23 MED ORDER — METFORMIN HCL 850 MG PO TABS
850.0000 mg | ORAL_TABLET | Freq: Every day | ORAL | Status: DC
  Administered 2020-02-23: 18:00:00 850 mg via ORAL
  Filled 2020-02-23 (×2): qty 1

## 2020-02-23 MED ORDER — FENTANYL CITRATE (PF) 100 MCG/2ML IJ SOLN
INTRAMUSCULAR | Status: DC | PRN
  Administered 2020-02-23 (×2): 50 ug via INTRAVENOUS

## 2020-02-23 MED ORDER — ONDANSETRON 4 MG PO TBDP: 4.0000 mg | ORAL_TABLET | Freq: Four times a day (QID) | ORAL | Status: DC | PRN

## 2020-02-23 MED ORDER — MIDAZOLAM HCL 2 MG/2ML IJ SOLN
INTRAMUSCULAR | Status: DC | PRN
  Administered 2020-02-23: 1 mg via INTRAVENOUS

## 2020-02-23 MED ORDER — LISINOPRIL 10 MG PO TABS
20.0000 mg | ORAL_TABLET | Freq: Every day | ORAL | Status: DC
  Administered 2020-02-23 – 2020-02-24 (×2): 20 mg via ORAL
  Filled 2020-02-23 (×2): qty 2

## 2020-02-23 MED ORDER — GLYCOPYRROLATE PF 0.2 MG/ML IJ SOSY
PREFILLED_SYRINGE | INTRAMUSCULAR | Status: AC
  Filled 2020-02-23: qty 1

## 2020-02-23 MED ORDER — KETOROLAC TROMETHAMINE 30 MG/ML IJ SOLN
INTRAMUSCULAR | Status: AC
  Filled 2020-02-23: qty 1

## 2020-02-23 MED ORDER — HYDROCODONE-ACETAMINOPHEN 5-325 MG PO TABS
1.0000 | ORAL_TABLET | ORAL | Status: DC | PRN
  Administered 2020-02-23: 2 via ORAL
  Filled 2020-02-23: qty 2

## 2020-02-23 MED ORDER — 0.9 % SODIUM CHLORIDE (POUR BTL) OPTIME
TOPICAL | Status: DC | PRN
  Administered 2020-02-23: 1000 mL

## 2020-02-23 MED ORDER — LORAZEPAM 2 MG/ML IJ SOLN: 0.5000 mg | INTRAMUSCULAR | Status: DC | PRN

## 2020-02-23 MED ORDER — PROPOFOL 10 MG/ML IV BOLUS
INTRAVENOUS | Status: AC
  Filled 2020-02-23: qty 40

## 2020-02-23 MED ORDER — AMLODIPINE BESYLATE 5 MG PO TABS
10.0000 mg | ORAL_TABLET | Freq: Every day | ORAL | Status: DC
  Administered 2020-02-23 – 2020-02-24 (×2): 10 mg via ORAL
  Filled 2020-02-23 (×2): qty 2

## 2020-02-23 MED ORDER — ACETAMINOPHEN 650 MG RE SUPP: 650.0000 mg | Freq: Four times a day (QID) | RECTAL | Status: DC | PRN

## 2020-02-23 MED ORDER — LACTATED RINGERS IV SOLN
Freq: Once | INTRAVENOUS | Status: AC
  Administered 2020-02-23: 1000 mL via INTRAVENOUS

## 2020-02-23 SURGICAL SUPPLY — 47 items
APPLIER CLIP 11 MED OPEN (CLIP) ×3
BINDER BREAST LRG (GAUZE/BANDAGES/DRESSINGS) ×3 IMPLANT
CHLORAPREP W/TINT 26 (MISCELLANEOUS) ×3 IMPLANT
CLIP APPLIE 11 MED OPEN (CLIP) ×1 IMPLANT
CLOTH BEACON ORANGE TIMEOUT ST (SAFETY) ×3 IMPLANT
COVER LIGHT HANDLE STERIS (MISCELLANEOUS) ×6 IMPLANT
COVER WAND RF STERILE (DRAPES) ×3 IMPLANT
ELECT REM PT RETURN 9FT ADLT (ELECTROSURGICAL) ×3
ELECTRODE REM PT RTRN 9FT ADLT (ELECTROSURGICAL) ×1 IMPLANT
EVACUATOR DRAINAGE 10X20 100CC (DRAIN) ×1 IMPLANT
EVACUATOR SILICONE 100CC (DRAIN) ×2
GAUZE SPONGE 4X4 12PLY STRL (GAUZE/BANDAGES/DRESSINGS) ×3 IMPLANT
GLOVE BIO SURGEON STRL SZ 6.5 (GLOVE) ×2 IMPLANT
GLOVE BIO SURGEON STRL SZ7.5 (GLOVE) ×3 IMPLANT
GLOVE BIO SURGEONS STRL SZ 6.5 (GLOVE) ×1
GLOVE BIOGEL PI IND STRL 6.5 (GLOVE) ×1 IMPLANT
GLOVE BIOGEL PI IND STRL 7.0 (GLOVE) ×3 IMPLANT
GLOVE BIOGEL PI IND STRL 7.5 (GLOVE) ×1 IMPLANT
GLOVE BIOGEL PI INDICATOR 6.5 (GLOVE) ×2
GLOVE BIOGEL PI INDICATOR 7.0 (GLOVE) ×6
GLOVE BIOGEL PI INDICATOR 7.5 (GLOVE) ×2
GLOVE SURG SS PI 7.5 STRL IVOR (GLOVE) ×3 IMPLANT
GOWN STRL REUS W/TWL LRG LVL3 (GOWN DISPOSABLE) ×12 IMPLANT
INST SET MINOR GENERAL (KITS) ×3 IMPLANT
KIT TURNOVER KIT A (KITS) ×3 IMPLANT
MANIFOLD NEPTUNE II (INSTRUMENTS) ×3 IMPLANT
NEEDLE HYPO 18GX1.5 BLUNT FILL (NEEDLE) ×3 IMPLANT
NEEDLE HYPO 22GX1.5 SAFETY (NEEDLE) ×3 IMPLANT
NS IRRIG 1000ML POUR BTL (IV SOLUTION) ×3 IMPLANT
PACK MINOR (CUSTOM PROCEDURE TRAY) ×3 IMPLANT
PAD ARMBOARD 7.5X6 YLW CONV (MISCELLANEOUS) ×3 IMPLANT
PENCIL SMOKE EVACUATOR COATED (MISCELLANEOUS) ×3 IMPLANT
SET BASIN LINEN APH (SET/KITS/TRAYS/PACK) ×3 IMPLANT
SPONGE DRAIN TRACH 4X4 STRL 2S (GAUZE/BANDAGES/DRESSINGS) ×3 IMPLANT
SPONGE INTESTINAL PEANUT (DISPOSABLE) ×3 IMPLANT
SPONGE LAP 18X18 RF (DISPOSABLE) ×9 IMPLANT
STAPLER VISISTAT (STAPLE) ×6 IMPLANT
SUT ETHILON 3 0 FSL (SUTURE) ×3 IMPLANT
SUT SILK 2 0 (SUTURE) ×2
SUT SILK 2 0 SH (SUTURE) ×3 IMPLANT
SUT SILK 2-0 18XBRD TIE 12 (SUTURE) ×1 IMPLANT
SUT VIC AB 2-0 CT1 27 (SUTURE) ×12
SUT VIC AB 2-0 CT1 TAPERPNT 27 (SUTURE) ×6 IMPLANT
SUT VIC AB 3-0 SH 27 (SUTURE) ×2
SUT VIC AB 3-0 SH 27X BRD (SUTURE) ×1 IMPLANT
SYR 20ML LL LF (SYRINGE) ×6 IMPLANT
SYR BULB IRRIGATION 50ML (SYRINGE) ×3 IMPLANT

## 2020-02-23 NOTE — Plan of Care (Signed)

## 2020-02-23 NOTE — Op Note (Signed)
Patient:  Sally [name]  DOB:  01/17/1956  MRN:  [id number]   Preop Diagnosis: Recurrent left breast carcinoma  Postop Diagnosis: Same  Procedure: Left modified radical mastectomy  Surgeon: Aviva Signs, MD  Assistant: Curlene Labrum, MD  Anes: General  Indications: Patient is a 64 year old black female who has had DCIS of the left breast, treated with partial mastectomy, sentinel lymph node biopsy, chemoradiation who now presents with recurrence of the left breast cancer.  DCIS was seen.  The patient now presents for left modified radical mastectomy.  The risks and benefits of the procedure including bleeding, infection, and the possibility of left arm swelling and pain were fully explained to the patient, who gave informed consent.  Procedure note: The patient was placed in the supine position.  After induction of general anesthesia, the left breast and axilla were prepped and draped using the usual sterile technique with ChloraPrep.  Surgical site confirmation was performed.  An elliptical incision was made medial to lateral around the left breast.  A superior flap was formed to the clavicle and inferior flap formed to the chest wall.  The breast along with the pectoralis fashion was removed medial to lateral using Bovie electrocautery.  A suture was placed superiorly for orientation purposes.  The left breast was removed and sent to pathology for further examination.  A limited left axillary dissection was performed as the patient had previous left axillary surgery.  I tried to remove a fat pad that seemed to contain a lymph node from the left axilla.  Medium clips were used to control vessels.  The specimen was sent to pathology further examination.  A #10 flat Jackson-Pratt drain was placed along the flap and brought out through a separate stab wound inferior to the incision line.  It was secured the place using a 3-0 nylon suture.  The subcutaneous layer was reapproximated using 2-0  Vicryl interrupted sutures.  Exparel was instilled into the surrounding wound.  The skin was closed using staples.  Betadine ointment and dry sterile dressing were applied.  All tape and needle counts were correct at the end of the procedure.  The patient was awakened and transferred to PACU in stable condition.  Complications: None  EBL: 50 cc  Specimen: Left breast, left axillary lymph node  Drains: Jackson-Pratt drain to left mastectomy flap

## 2020-02-23 NOTE — Anesthesia Postprocedure Evaluation (Signed)
Anesthesia Post Note  Patient: Sally [name]  Procedure(s) Performed: MASTECTOMY MODIFIED RADICAL (Left Breast)  Patient location during evaluation: PACU Anesthesia Type: General Level of consciousness: awake and alert and oriented Pain management: pain level controlled Vital Signs Assessment: post-procedure vital signs reviewed and stable Respiratory status: spontaneous breathing Cardiovascular status: blood pressure returned to baseline and stable Postop Assessment: no apparent nausea or vomiting Anesthetic complications: no     Last Vitals:  Vitals:   02/23/20 0930 02/23/20 0945  BP: 126/83 115/73  Pulse: 79 61  Resp: 17 15  Temp:    SpO2: 97% 97%    Last Pain:  Vitals:   02/23/20 0930  TempSrc:   PainSc: 5                  Aaidyn San

## 2020-02-23 NOTE — Transfer of Care (Signed)
Immediate Anesthesia Transfer of Care Note  Patient: Sally [name]  Procedure(s) Performed: MASTECTOMY MODIFIED RADICAL (Left Breast)  Patient Location: PACU  Anesthesia Type:General  Level of Consciousness: awake, alert  and patient cooperative  Airway & Oxygen Therapy: Patient Spontanous Breathing  Post-op Assessment: Report given to RN and Post -op Vital signs reviewed and stable  Post vital signs: Reviewed and stable  Last Vitals:  Vitals Value Taken Time  BP 144/91 02/23/20 0847  Temp    Pulse 99 02/23/20 0848  Resp 14 02/23/20 0848  SpO2 96 % 02/23/20 0848  Vitals shown include unvalidated device data.  Last Pain:  Vitals:   02/23/20 0651  TempSrc: Oral  PainSc: 0-No pain      Patients Stated Pain Goal: 9 ([phone] [id number])  Complications: No apparent anesthesia complications

## 2020-02-23 NOTE — Anesthesia Preprocedure Evaluation (Signed)
Anesthesia Evaluation  Patient identified by MRN, date of birth, ID band Patient awake    Reviewed: Allergy & Precautions, NPO status , Patient's Chart, lab work & pertinent test results  Airway Mallampati: II  TM Distance: >3 FB Neck ROM: Full    Dental  (+) Partial Upper, Partial Lower   Pulmonary shortness of breath (patient was evaluated for SOB) and with exertion, Current Smoker and Patient abstained from smoking.,  IMPRESSION: Scattered nodular opacities, predominately within the right mid and upper lung. Recommend further evaluation with chest CT. Additionally, this was recommended prior chest CT 02/10/2019.   Electronically Signed   By: Lovey Newcomer M.D.   On: 02/20/2020 09:34    Pulmonary exam normal breath sounds clear to auscultation       Cardiovascular METS: 3 - Mets hypertension, Pt. 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    Neuro/Psych negative neurological ROS  negative psych ROS   GI/Hepatic negative GI ROS, Hemangioma/cyst - CT abdomen   Endo/Other  diabetes, Well Controlled, Type 2  Renal/GU negative Renal ROS     Musculoskeletal   Abdominal   Peds  Hematology   Anesthesia Other Findings Left breast cancer, chemotherapy, radiation - 2014 , 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  Reproductive/Obstetrics                           Anesthesia Physical Anesthesia Plan  ASA: III  Anesthesia Plan: General   Post-op Pain Management:    Induction: Intravenous  PONV Risk Score and Plan: 3 and Dexamethasone, Ondansetron and Midazolam  Airway  Management Planned: LMA  Additional Equipment:   Intra-op Plan:   Post-operative Plan:   Informed Consent: I have reviewed the patients History and Physical, chart, labs and discussed the procedure including the risks, benefits and alternatives for the proposed anesthesia with the patient or authorized representative who has indicated his/her understanding and acceptance.     Dental advisory given  Plan Discussed with: CRNA  Anesthesia Plan Comments:        Anesthesia Quick Evaluation

## 2020-02-23 NOTE — Interval H&P Note (Signed)
History and Physical Interval Note:  02/23/2020 6:50 AM  Sally [name]  has presented today for surgery, with the diagnosis of Left Breast Cancer.  The various methods of treatment have been discussed with the patient and family. After consideration of risks, benefits and other options for treatment, the patient has consented to  Procedure(s): MASTECTOMY MODIFIED RADICAL (Left) as a surgical intervention.  The patient's history has been reviewed, patient examined, no change in status, stable for surgery.  I have reviewed the patient's chart and labs.  Questions were answered to the patient's satisfaction.     Aviva Signs

## 2020-02-23 NOTE — Anesthesia Procedure Notes (Signed)
Procedure Name: LMA Insertion Date/Time: 02/23/2020 7:37 AM Performed by: Georgeanne Nim, CRNA Pre-anesthesia Checklist: Patient identified, Emergency Drugs available, Suction available, Patient being monitored and Timeout performed Patient Re-evaluated:Patient Re-evaluated prior to induction Oxygen Delivery Method: Circle system utilized Preoxygenation: Pre-oxygenation with 100% oxygen Induction Type: IV induction Ventilation: Mask ventilation without difficulty LMA: LMA inserted LMA Size: 4.0 Number of attempts: 1 Placement Confirmation: positive ETCO2,  CO2 detector and breath sounds checked- equal and bilateral Tube secured with: Tape Dental Injury: Teeth and Oropharynx as per pre-operative assessment

## 2020-02-24 LAB — CBC
HCT: 36.9 % (ref 36.0–46.0)
Hemoglobin: 12.4 g/dL (ref 12.0–15.0)
MCH: 31.2 pg (ref 26.0–34.0)
MCHC: 33.6 g/dL (ref 30.0–36.0)
MCV: 92.7 fL (ref 80.0–100.0)
Platelets: 199 10*3/uL (ref 150–400)
RBC: 3.98 MIL/uL (ref 3.87–5.11)
RDW: 12.4 % (ref 11.5–15.5)
WBC: 11.5 10*3/uL — ABNORMAL HIGH (ref 4.0–10.5)
nRBC: 0 % (ref 0.0–0.2)

## 2020-02-24 LAB — BASIC METABOLIC PANEL
Anion gap: 8 (ref 5–15)
BUN: 23 mg/dL (ref 8–23)
CO2: 25 mmol/L (ref 22–32)
Calcium: 8.7 mg/dL — ABNORMAL LOW (ref 8.9–10.3)
Chloride: 103 mmol/L (ref 98–111)
Creatinine, Ser: 1 mg/dL (ref 0.44–1.00)
GFR calc Af Amer: >60 mL/min (ref >60)
GFR calc non Af Amer: >60 mL/min (ref >60)
Glucose, Bld: 153 mg/dL — ABNORMAL HIGH (ref 70–99)
Potassium: 4 mmol/L (ref 3.5–5.1)
Sodium: 136 mmol/L (ref 135–145)

## 2020-02-24 LAB — PHOSPHORUS: Phosphorus: 3.6 mg/dL (ref 2.5–4.6)

## 2020-02-24 LAB — GLUCOSE, CAPILLARY: Glucose-Capillary: 120 mg/dL — ABNORMAL HIGH (ref 70–99)

## 2020-02-24 LAB — MAGNESIUM: Magnesium: 1.7 mg/dL (ref 1.7–2.4)

## 2020-02-24 MED ORDER — HYDROCODONE-ACETAMINOPHEN 5-325 MG PO TABS: 1.0000 | ORAL_TABLET | ORAL | 0 refills | Status: DC | PRN

## 2020-02-24 NOTE — Plan of Care (Signed)

## 2020-02-24 NOTE — Discharge Summary (Signed)
Physician Discharge Summary  Patient ID: BREIA ABREW MRN: [id number] DOB/AGE: 04-10-1956 64 y.o.  Admit date: 02/23/2020 Discharge date: 02/24/2020  Admission Diagnoses: Recurrent left breast carcinoma  Discharge Diagnoses: Same Active Problems:   S/P mastectomy, left   Malignant neoplasm of upper-outer quadrant of left breast in female, estrogen receptor negative ([place]) Non-insulin-dependent diabetes mellitus, hypertension, hypercholesterolemia  Discharged Condition: good  Hospital Course: Patient is a 64 year old black female who presented to [facility] for left modified radical mastectomy due to recurrence of her left breast cancer.  The surgery was performed on 02/23/2020.  She tolerated the procedure well.  Her postoperative course has been unremarkable.  Her diet was advanced out difficulty.  Her JP drainage has been appropriate.  The patient is being discharged home on postoperative day 1 in good and improving condition.  Treatments: surgery: Left modified radical mastectomy on 02/23/2020  Discharge Exam: Blood pressure 121/77, pulse 77, temperature 97.6 F (36.4 C), temperature source Oral, resp. rate 18, height 5\' 1"  (1.549 m), weight 63.5 kg, SpO2 100 %. General appearance: alert, cooperative and no distress Resp: clear to auscultation bilaterally Breasts: Left mastectomy incision healing well.  JP drainage serosanguineous in nature.  No hematoma present. Cardio: regular rate and rhythm, S1, S2 normal, no murmur, click, rub or gallop  Disposition: Discharge disposition: 01-Home or Self Care       Discharge Instructions    Diet - low sodium heart healthy   Complete by: As directed    Increase activity slowly   Complete by: As directed      Allergies as of 02/24/2020      Reactions   Lemon Flavor    Peels tongue   Other Itching, Swelling   Beer   Shrimp [shellfish Allergy]    Feels like a stick is in her throat      Medication List    TAKE these  medications   amLODipine 10 MG tablet Commonly known as: NORVASC Take 10 mg by mouth daily.   atorvastatin 40 MG tablet Commonly known as: LIPITOR Take 40 mg by mouth daily.   cetirizine 10 MG tablet Commonly known as: ZYRTEC Take 10 mg by mouth daily.   HYDROcodone-acetaminophen 5-325 MG tablet Commonly known as: Norco Take 1 tablet by mouth every 4 (four) hours as needed for moderate pain.   lisinopril-hydrochlorothiazide 20-12.5 MG tablet Commonly known as: ZESTORETIC Take 1 tablet by mouth daily.   metFORMIN 850 MG tablet Commonly known as: GLUCOPHAGE Take 850 mg by mouth daily with supper.      Follow-up Information    Aviva Signs, MD. Schedule an appointment as soon as possible for a visit on 03/04/2020.   Specialty: General Surgery Contact information: [id number] [id number] [phone]           Signed: Aviva Signs 02/24/2020, 10:03 AM

## 2020-02-24 NOTE — Discharge Instructions (Signed)
Total or Modified Radical Mastectomy, Care After This sheet gives you information about how to care for yourself after your procedure. Your health care provider may also give you more specific instructions. If you have problems or questions, contact your health care provider. What can I expect after the procedure? After the procedure, it is common to have:  Pain.  Numbness.  Stiffness in the arm or shoulder.  Feelings of stress, sadness, or depression. If the lymph nodes under your arm were removed, you may have arm swelling, weakness, or numbness on the same side of your body as your surgery. Follow these instructions at home: Incision care   Follow instructions from your health care provider about how to take care of your incision. Make sure you: ? Wash your hands with soap and water before you change your bandage (dressing). If soap and water are not available, use hand sanitizer. ? Change your dressing as told by your health care provider. ? Leave stitches (sutures), skin glue, or adhesive strips in place. These skin closures may need to stay in place for 2 weeks or longer. If adhesive strip edges start to loosen and curl up, you may trim the loose edges. Do not remove adhesive strips completely unless your health care provider tells you to do that.  Check your incision area every day for signs of infection. Check for: ? Redness, swelling, or more pain. ? Fluid or blood. ? Warmth. ? Pus or a bad smell.  If you were sent home with a surgical drain in place, follow instructions from your health care provider about emptying it. Bathing  Do not take baths, swim, or use a hot tub until your health care provider approves. Ask your health care provider if you may take showers. You may only be allowed to take sponge baths. Activity  Return to your normal activities as told by your health care provider. Ask your health care provider what activities are safe for you.  Avoid activities  that take a lot of effort.  Be careful to avoid any activities that could cause an injury to your arm on the side of your surgery.  Do not lift anything that is heavier than 10 lb (4.5 kg), or the limit that you are told, until your health care provider says that it is safe.  Avoid lifting with the arm on the side of your surgery.  Do not carry heavy objects on your shoulder.  After your drain is removed, do exercises to prevent stiffness and swelling in your arm. Talk with your health care provider about which exercises are safe for you. General instructions  Take over-the-counter and prescription medicines only as told by your health care provider.  You may eat what you usually do.  Keep your arm raised (elevated) above the level of your heart when you are sitting or lying down.  Do not wear tight jewelry on your arm, wrist, or fingers on the side of your surgery.  You may be given a tight sleeve (compression bandage) to wear over your arm on the side of your surgery. Wear this sleeve as told by your health care provider.  Ask your health care provider when you can start wearing a bra or using a breast prosthesis.  Before you are involved in certain procedures such as giving blood or having your blood pressure checked, tell all your health care providers if lymph nodes under your arm were removed. This is important information. Follow-up  Keep all follow-up visits   as told by your health care provider. This is important.  Get checked for extra fluid around your lymph nodes (lymphedema) as often as told by your health care provider. Contact a health care provider if:  You have a fever.  Your pain medicine is not working.  Your arm swelling, weakness, or numbness has not improved after a few weeks.  You have new swelling in your breast area or arm.  You have redness, swelling, or more pain in your incision area.  You have fluid or blood coming from your incision.  Your  incision feels warm to the touch.  You have pus or a bad smell coming from your incision. Get help right away if:  You have very bad pain in your breast area or arm.  You have chest pain.  You have difficulty breathing. Summary  Follow instructions from your health care provider about how to take care of your incision. Check your incision area every day for signs of infection.  Ask your health care provider what activities are safe for you.  Keep all follow-up visits as told by your health care provider. This is important.  Make sure you know which symptoms should cause you to contact your health care provider or to get help right away. This information is not intended to replace advice given to you by your health care provider. Make sure you discuss any questions you have with your health care provider. Document Revised: 02/14/2019 Document Reviewed: 09/14/2017 Elsevier Patient Education  2020 [employer] Surgical Drain Home Care Surgical drains are used to remove extra fluid that normally builds up in a surgical wound after surgery. A surgical drain helps to heal a surgical wound. Different kinds of surgical drains include:  Active drains. These drains use suction to pull drainage away from the surgical wound. Drainage flows through a tube to a container outside of the body. With these drains, you need to keep the bulb or the drainage container flat (compressed) at all times, except while you empty it. Flattening the bulb or container creates suction.  Passive drains. These drains allow fluid to drain naturally, by gravity. Drainage flows through a tube to a bandage (dressing) or a container outside of the body. Passive drains do not need to be emptied. A drain is placed during surgery. Right after surgery, drainage is usually bright red and a little thicker than water. The drainage may gradually turn yellow or pink and become thinner. It is likely that your health care provider will  remove the drain when the drainage stops or when the amount decreases to 1-2 Tbsp (15-30 mL) during a 24-hour period. Supplies needed:  Tape.  Germ-free cleaning solution (sterile saline).  Cotton swabs.  Split gauze drain sponge: 4 x 4 inches (10 x 10 cm).  Gauze square: 4 x 4 inches (10 x 10 cm). How to care for your surgical drain Care for your drain as told by your health care provider. This is important to help prevent infection. If your drain is placed at your back, or any other hard-to-reach area, ask another person to assist you in performing the following tasks: General care  Keep the skin around the drain dry and covered with a dressing at all times.  Check your drain area every day for signs of infection. Check for: ? Redness, swelling, or pain. ? Pus or a bad smell. ? Cloudy drainage. ? Tenderness or pressure at the drain exit site. Changing the dressing Follow instructions from your   health care provider about how to change your dressing. Change your dressing at least once a day. Change it more often if needed to keep the dressing dry. Make sure you: 1. Gather your supplies. 2. Wash your hands with soap and water before you change your dressing. If soap and water are not available, use hand sanitizer. 3. Remove the old dressing. Avoid using scissors to do that. 4. Wash your hands with soap and water again after removing the old dressing. 5. Use sterile saline to clean your skin around the drain. You may need to use a cotton swab to clean the skin. 6. Place the tube through the slit in a drain sponge. Place the drain sponge so that it covers your wound. 7. Place the gauze square or another drain sponge on top of the drain sponge that is on the wound. Make sure the tube is between those layers. 8. Tape the dressing to your skin. 9. Tape the drainage tube to your skin 1-2 inches (2.5-5 cm) below the place where the tube enters your body. Taping keeps the tube from pulling on  any stitches (sutures) that you have. 10. Wash your hands with soap and water. 11. Write down the color of your drainage and how often you change your dressing. How to empty your active drain  1. Make sure that you have a measuring cup that you can empty your drainage into. 2. Wash your hands with soap and water. If soap and water are not available, use hand sanitizer. 3. Loosen any pins or clips that hold the tube in place. 4. If your health care provider tells you to strip the tube to prevent clots and tube blockages: ? Hold the tube at the skin with one hand. Use your other hand to pinch the tubing with your thumb and first finger. ? Gently move your fingers down the tube while squeezing very lightly. This clears any drainage, clots, or tissue from the tube. ? You may need to do this several times each day to keep the tube clear. Do not pull on the tube. 5. Open the bulb cap or the drain plug. Do not touch the inside of the cap or the bottom of the plug. 6. Turn the device upside down and gently squeeze. 7. Empty all of the drainage into the measuring cup. 8. Compress the bulb or the container and replace the cap or the plug. To compress the bulb or the container, squeeze it firmly in the middle while you close the cap or plug the container. 9. Write down the amount of drainage that you have in each 24-hour period. If you have less than 2 Tbsp (30 mL) of drainage during 24 hours, contact your health care provider. 10. Flush the drainage down the toilet. 11. Wash your hands with soap and water. Contact a health care provider if:  You have redness, swelling, or pain around your drain area.  You have pus or a bad smell coming from your drain area.  You have a fever or chills.  The skin around your drain is warm to the touch.  The amount of drainage that you have is increasing instead of decreasing.  You have drainage that is cloudy.  There is a sudden stop or a sudden decrease in the  amount of drainage that you have.  Your drain tube falls out.  Your active drain does not stay compressed after you empty it. Summary  Surgical drains are used to remove extra fluid   that normally builds up in a surgical wound after surgery.  Different kinds of surgical drains include active drains and passive drains. Active drains use suction to pull drainage away from the surgical wound, and passive drains allow fluid to drain naturally.  It is important to care for your drain to prevent infection. If your drain is placed at your back, or any other hard-to-reach area, ask another person to assist you.  Contact your health care provider if you have redness, swelling, or pain around your drain area. This information is not intended to replace advice given to you by your health care provider. Make sure you discuss any questions you have with your health care provider. Document Revised: 01/15/2019 Document Reviewed: 01/15/2019 Elsevier Patient Education  2020 [employer] Inc[employer]  

## 2020-02-26 LAB — SURGICAL PATHOLOGY

## 2020-02-27 ENCOUNTER — Telehealth: Payer: Self-pay

## 2020-02-27 NOTE — Telephone Encounter (Signed)
Patient had questions regrading wearing the binder and her drain site- She was instructed to wear the binder as it compresses and keeps the fluid from gathering- and to wipe with clean cloth around drain site and place new gauze over the area- and to be sure to record drain output.  Patient has follow up appointment 03/04/20 with Dr.Jenkins.

## 2020-03-04 ENCOUNTER — Ambulatory Visit (INDEPENDENT_AMBULATORY_CARE_PROVIDER_SITE_OTHER): Payer: Self-pay | Admitting: General Surgery

## 2020-03-04 ENCOUNTER — Other Ambulatory Visit: Payer: Self-pay

## 2020-03-04 ENCOUNTER — Encounter: Payer: Self-pay | Admitting: General Surgery

## 2020-03-04 VITALS — BP 121/78 | HR 83 | Temp 98.2°F | Resp 12 | Ht 61.0 in | Wt 145.0 lb

## 2020-03-04 DIAGNOSIS — Z09 Encounter for follow-up examination after completed treatment for conditions other than malignant neoplasm: Secondary | ICD-10-CM

## 2020-03-04 NOTE — Progress Notes (Signed)
Subjective:     Sally [name]  Patient here for follow-up, status post left modified radical mastectomy.  Patient is doing well.  She has approximately 10 cc of bloody drainage in her drain daily.  No significant complaints of pain. Objective:    BP 121/78   Pulse 83   Temp 98.2 F (36.8 C) (Oral)   Resp 12   Ht 5\' 1"  (1.549 m)   Wt 145 lb (65.8 kg)   SpO2 96%   BMI 27.40 kg/m   General:  alert, cooperative and no distress  Left mastectomy incision healing well.  No hematoma present.  No drainage is noted. 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  No lymph nodes were found.     Assessment:    Doing well postoperatively.    Plan:   Patient to return next week for drain and staple removal.  Will refer back to oncology at that time.

## 2020-03-09 ENCOUNTER — Ambulatory Visit (INDEPENDENT_AMBULATORY_CARE_PROVIDER_SITE_OTHER): Payer: Self-pay | Admitting: General Surgery

## 2020-03-09 ENCOUNTER — Encounter: Payer: Self-pay | Admitting: General Surgery

## 2020-03-09 ENCOUNTER — Other Ambulatory Visit: Payer: Self-pay

## 2020-03-09 VITALS — BP 127/80 | HR 92 | Temp 97.4°F | Resp 14 | Ht 61.0 in | Wt 144.8 lb

## 2020-03-09 DIAGNOSIS — Z09 Encounter for follow-up examination after completed treatment for conditions other than malignant neoplasm: Secondary | ICD-10-CM

## 2020-03-10 NOTE — Progress Notes (Signed)
Subjective:     Sally [name]  Patient here for postoperative visit, status post left modified radical mastectomy.  She is doing well.  Her JP drainage is minimal and serosanguineous in nature.  She has 0 out of 10 pain.  She is already aware of pathology results. Objective:    BP 127/80   Pulse 92   Temp (!) 97.4 F (36.3 C) (Oral)   Resp 14   Ht 5\' 1"  (1.549 m)   Wt 144 lb 12.8 oz (65.7 kg)   SpO2 95%   BMI 27.36 kg/m   General:  alert, cooperative and no distress  Left mastectomy site healing well.  JP drain removed.  Staples removed, Steri-Strips applied.     Assessment:    Doing well postoperatively.    Plan:   Patient already has appointment to follow-up with Dr. Delton Coombes.  Patient to follow-up here in 1 month.  Patient already has prescription for breast prosthesis.

## 2020-03-11 ENCOUNTER — Other Ambulatory Visit: Payer: Self-pay

## 2020-03-11 ENCOUNTER — Inpatient Hospital Stay (HOSPITAL_COMMUNITY): Payer: 59 | Attending: Hematology | Admitting: Hematology

## 2020-03-11 ENCOUNTER — Encounter (HOSPITAL_COMMUNITY): Payer: Self-pay | Admitting: Hematology

## 2020-03-11 VITALS — BP 123/81 | HR 97 | Temp 96.8°F | Resp 17 | Wt 146.0 lb

## 2020-03-11 DIAGNOSIS — Z170 Estrogen receptor positive status [ER+]: Secondary | ICD-10-CM | POA: Diagnosis not present

## 2020-03-11 DIAGNOSIS — Z79811 Long term (current) use of aromatase inhibitors: Secondary | ICD-10-CM | POA: Insufficient documentation

## 2020-03-11 DIAGNOSIS — Z780 Asymptomatic menopausal state: Secondary | ICD-10-CM | POA: Diagnosis not present

## 2020-03-11 DIAGNOSIS — F17210 Nicotine dependence, cigarettes, uncomplicated: Secondary | ICD-10-CM | POA: Insufficient documentation

## 2020-03-11 DIAGNOSIS — Z79899 Other long term (current) drug therapy: Secondary | ICD-10-CM | POA: Diagnosis not present

## 2020-03-11 DIAGNOSIS — Z7984 Long term (current) use of oral hypoglycemic drugs: Secondary | ICD-10-CM | POA: Insufficient documentation

## 2020-03-11 DIAGNOSIS — E119 Type 2 diabetes mellitus without complications: Secondary | ICD-10-CM | POA: Diagnosis not present

## 2020-03-11 DIAGNOSIS — Z9012 Acquired absence of left breast and nipple: Secondary | ICD-10-CM | POA: Insufficient documentation

## 2020-03-11 DIAGNOSIS — I10 Essential (primary) hypertension: Secondary | ICD-10-CM | POA: Insufficient documentation

## 2020-03-11 DIAGNOSIS — D0512 Intraductal carcinoma in situ of left breast: Secondary | ICD-10-CM | POA: Diagnosis not present

## 2020-03-11 MED ORDER — ANASTROZOLE 1 MG PO TABS: 1.0000 mg | ORAL_TABLET | Freq: Every day | ORAL | 1 refills | Status: DC

## 2020-03-11 NOTE — Progress Notes (Signed)
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  CURRENT THERAPY: Being planned for surgery.  BRIEF ONCOLOGIC HISTORY:  Oncology History  Invasive ductal carcinoma of left breast ([facility])  09/24/2013 Initial Diagnosis   Invasive ductal carcinoma of left breast   11/12/2013 Surgery   Left breast lumpectomy by Dr. Arnoldo Morale- High grade invasive ductal carcinoma less than 0.1 cm from posterior and medial margins, 0/1 lymph nodes, triple negative   12/31/2013 - 04/20/2014 Chemotherapy   TAC every 21 days.  Adriamycin dropped following cycle 3 for a decreased EF compared to baseline.   05/12/2014 - 07/03/2014 Radiation Therapy   XRT      CANCER STAGING: Cancer Staging Invasive ductal carcinoma of left breast (HCC) Staging form: Breast, AJCC 7th Edition - Clinical: Stage IA (T1c, N0, cM0) - Signed by Baird Cancer, PA-C on 01/06/2014    INTERVAL HISTORY:  Sally [name] 64 y.o. female seen for follow-up of newly diagnosed left breast DCIS.  She underwent left mastectomy on 02/23/2020.  Drains were removed 2 days ago.  She still has soreness in the left breast area.  Appetite is 100%.  Energy levels are 75%.  She is wondering when she could go back to work.  Denies any major hot flashes.  Has some baseline hair thinning.    REVIEW OF SYSTEMS:  Review of Systems  All other systems reviewed and are negative.    PAST MEDICAL/SURGICAL HISTORY:  Past Medical History:  Diagnosis Date  . Cancer ([place]) 11/12/2013   left breast cancer  . 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  Outpatient MUGA scan performed on 02/25/2014 showed a drop in ejection fraction from 72% to 59% and for that reason doxorubicin was discontinued from her  chemotherapy regimen starting on cycle 4.   . Chronic sinusitis   . Diabetes mellitus without complication (HCC)    diet controlled  . Hives 05/2014  . Hypertension   . Invasive ductal carcinoma of left breast ([facility]) 12/11/2013  . Personal history of chemotherapy   . Personal history of radiation therapy   . Sarcoidosis    diagnosed in the 1990s. doesn't recall treatment  . Tobacco abuse 10/21/2014   Past Surgical History:  Procedure Laterality Date  . ABDOMINAL HYSTERECTOMY    . COLONOSCOPY  01/09/2012   Dr. Oneida Alar: diverticulosis, hyperplastic polyps, hemorrhoids. next TCS in 10 years.   Marland Kitchen DILATION AND CURETTAGE OF UTERUS    . ECTOPIC PREGNANCY SURGERY    . GANGLION CYST EXCISION    . INGUINAL HERNIA REPAIR Left   . left leg surgery     17 yrs of age  . MASTECTOMY MODIFIED RADICAL Left 02/23/2020   Procedure: MASTECTOMY MODIFIED RADICAL;  Surgeon: Aviva Signs, MD;  Location: [facility];  Service: General;  Laterality: Left;  . PARTIAL MASTECTOMY WITH NEEDLE LOCALIZATION AND AXILLARY SENTINEL LYMPH NODE BX Left 11/12/2013   Procedure: PARTIAL MASTECTOMY WITH NEEDLE LOCALIZATION AND AXILLARY SENTINEL LYMPH NODE BX;  Surgeon: Jamesetta So, MD;  Location: [facility];  Service: General;  Laterality: Left;  . PORT-A-CATH REMOVAL Right 12/20/2016   Procedure: MINOR REMOVAL PORT-A-CATH;  Surgeon: Aviva Signs, MD;  Location: [facility];  Service: General;  Laterality: Right;  . PORTACATH PLACEMENT Right 12/29/2013   Procedure:  INSERTION PORT-A-CATH;  Surgeon: Jamesetta So, MD;  Location: [facility];  Service: General;  Laterality: Right;     SOCIAL HISTORY:  Social History   Socioeconomic History  . Marital status: Divorced    Spouse name: Not on file  . Number of children: Not on file  . Years of education: Not on file  . Highest education level: Not on file  Occupational History  . Not on file  Tobacco Use  . Smoking status: Current Every Day Smoker    Packs/day: 0.25    Types: Cigarettes   . Smokeless tobacco: Never Used  Substance and Sexual Activity  . Alcohol use: Yes    Comment: social once a week  . Drug use: No  . Sexual activity: Yes    Birth control/protection: Surgical  Other Topics Concern  . Not on file  Social History Narrative  . Not on file   Social Determinants of Health   Financial Resource Strain:   . Difficulty of Paying Living Expenses:   Food Insecurity:   . Worried About Charity fundraiser in the Last Year:   . Arboriculturist in the Last Year:   Transportation Needs:   . Film/video editor (Medical):   Marland Kitchen Lack of Transportation (Non-Medical):   Physical Activity:   . Days of Exercise per Week:   . Minutes of Exercise per Session:   Stress:   . Feeling of Stress :   Social Connections:   . Frequency of Communication with Friends and Family:   . Frequency of Social Gatherings with Friends and Family:   . Attends Religious Services:   . Active Member of Clubs or Organizations:   . Attends Archivist Meetings:   Marland Kitchen Marital Status:   Intimate Partner Violence:   . Fear of Current or Ex-Partner:   . Emotionally Abused:   Marland Kitchen Physically Abused:   . Sexually Abused:     FAMILY HISTORY:  Family History  Problem Relation Age of Onset  . Diabetes type II Mother   . Diabetes type II Father   . Prostate cancer Father   . Colon cancer Neg Hx   . Liver cancer Neg Hx     CURRENT MEDICATIONS:  Outpatient Encounter Medications as of 03/11/2020  Medication Sig  . amLODipine (NORVASC) 10 MG tablet Take 10 mg by mouth daily.   Marland Kitchen atorvastatin (LIPITOR) 40 MG tablet Take 40 mg by mouth daily.  . cetirizine (ZYRTEC) 10 MG tablet Take 10 mg by mouth daily.  Marland Kitchen lisinopril-hydrochlorothiazide (PRINZIDE,ZESTORETIC) 20-12.5 MG tablet Take 1 tablet by mouth daily.   . metFORMIN (GLUCOPHAGE) 850 MG tablet Take 850 mg by mouth daily with supper.   Marland Kitchen anastrozole (ARIMIDEX) 1 MG tablet Take 1 tablet (1 mg total) by mouth daily.  Marland Kitchen  HYDROcodone-acetaminophen (NORCO) 5-325 MG tablet Take 1 tablet by mouth every 4 (four) hours as needed for moderate pain. (Patient not taking: Reported on 03/11/2020)   No facility-administered encounter medications on file as of 03/11/2020.    ALLERGIES:  Allergies  Allergen Reactions  . Lemon Flavor     Peels tongue  . Other Itching and Swelling    Beer  . Shrimp [Shellfish Allergy]     Feels like a stick is in her throat     PHYSICAL EXAM:  ECOG Performance status: 0  Vitals:   03/11/20 1510  BP: 123/81  Pulse: 97  Resp: 17  Temp: (!) 96.8 F (36 C)  SpO2: 99%   Filed Weights   03/11/20 1510  Weight: 146 lb (66.2 kg)    Physical Exam Vitals reviewed.  Constitutional:      Appearance: Normal appearance.  Cardiovascular:     Rate and Rhythm: Normal rate and regular rhythm.     Heart sounds: Normal heart sounds.  Pulmonary:     Effort: Pulmonary effort is normal.     Breath sounds: Normal breath sounds.  Abdominal:     General: There is no distension.     Palpations: Abdomen is soft. There is no mass.  Skin:    General: Skin is warm.  Neurological:     General: No focal deficit present.     Mental Status: She is alert and oriented to person, place, and time.  Psychiatric:        Mood and Affect: Mood normal.        Behavior: Behavior normal.    Left breast mastectomy site is healed well.  LABORATORY DATA:  I have reviewed the labs as listed.  CBC    Component Value Date/Time   WBC 11.5 (H) 02/24/2020 0454   RBC 3.98 02/24/2020 0454   HGB 12.4 02/24/2020 0454   HCT 36.9 02/24/2020 0454   PLT 199 02/24/2020 0454   MCV 92.7 02/24/2020 0454   MCH 31.2 02/24/2020 0454   MCHC 33.6 02/24/2020 0454   RDW 12.4 02/24/2020 0454   LYMPHSABS 1.5 02/20/2020 0902   MONOABS 0.6 02/20/2020 0902   EOSABS 0.2 02/20/2020 0902   BASOSABS 0.0 02/20/2020 0902   CMP Latest Ref Rng & Units 02/24/2020 02/20/2020 12/30/2019  Glucose 70 - 99 mg/dL 153(H) 158(H) -  BUN 8  - 23 mg/dL 23 23 -  Creatinine 0.44 - 1.00 mg/dL 1.00 1.01(H) -  Sodium 135 - 145 mmol/L 136 138 -  Potassium 3.5 - 5.1 mmol/L 4.0 3.5 -  Chloride 98 - 111 mmol/L 103 102 -  CO2 22 - 32 mmol/L 25 26 -  Calcium 8.9 - 10.3 mg/dL 8.7(L) 9.3 -  Total Protein 6.5 - 8.1 g/dL - 7.4 7.6  Total Bilirubin 0.3 - 1.2 mg/dL - 0.6 0.7  Alkaline Phos 38 - 126 U/L - 161(H) 168(H)  AST 15 - 41 U/L - 20 23  ALT 0 - 44 U/L - 23 26       DIAGNOSTIC IMAGING:  I have independently reviewed imaging.    ASSESSMENT & PLAN:   Breast neoplasm, Tis (DCIS), left 1.  High-grade left breast DCIS, ER/PR 2% positive: -Bilateral mammogram on 12/30/2019 showed new calcifications.  Biopsy was done on 01/08/2020.  She underwent left mastectomy on 02/23/2020. -We reviewed pathology report which showed 0.5 cm high-grade DCIS with central necrosis.  Margins negative.  ER/PR was 2% positive.  pTis, P NX. -We had a prolonged discussion about the normal prognosis of left breast DCIS. -I have recommended antiestrogen therapy with anastrozole because of weakly positive ER and PR. -We talked about the side effects including but not limited to hot flashes, musculoskeletal pains, decreased bone mineral density and rare chance of thromboembolic phenomena. -We will start her on anastrozole 1 mg daily for 5 years.  We have sent it to her pharmacy. -I will reevaluate her in 2 months with repeat labs.  I will also plan to check vitamin D level. -We will also obtain a baseline DEXA scan.  2.  Left triple negative breast cancer: -Status post lumpectomy on 11/12/2013. -Status post 6 cycles of TAC from  12/31/2013 through 04/20/2014 with Adriamycin held after first 3 cycles secondary to decreased ejection fraction. -XRT from 05/12/2014 through 07/03/2014.    Orders placed this encounter:  Orders Placed This Encounter  Procedures  . DG Bone Density  . CBC with Differential/Platelet  . Comprehensive metabolic panel  . Vitamin D 25  hydroxy      Derek Jack, MD [place] [phone]

## 2020-03-11 NOTE — Patient Instructions (Signed)
[place] at [facility] Discharge Instructions  You were seen today by Dr. Delton Coombes. He went over your recent test results. The surgical pathology shows that you have a pre-cancer called DCIS. He would like you to start on an antiestrogen pill (anastrozole) that would be taken daily at the same time for 5 years. He wint over the side effects of the medication and what to expect while taking the medication. This pill can make your bones weak so he would like you to start taking calcium and vit D daily as well as a bone density test. You will NOT need chemotherapy or radiation. He will see you back in 2 months for labs and follow up.   Thank you for choosing [place] at [facility] to provide your oncology and hematology care.  To afford each patient quality time with our provider, please arrive at least 15 minutes before your scheduled appointment time.   If you have a lab appointment with the [facility] please come in thru the  Main Entrance and check in at the main information desk  You need to re-schedule your appointment should you arrive 10 or more minutes late.  We strive to give you quality time with our providers, and arriving late affects you and other patients whose appointments are after yours.  Also, if you no show three or more times for appointments you may be dismissed from the clinic at the providers discretion.     Again, thank you for choosing [facility].  Our hope is that these requests will decrease the amount of time that you wait before being seen by our physicians.       _____________________________________________________________  Should you have questions after your visit to [facility], please contact our office at [phone] between the hours of 8:00 a.m. and 4:30 p.m.  Voicemails left after 4:00 p.m. will not be returned until the following business day.  For prescription refill requests,  have your pharmacy contact our office and allow 72 hours.    Cancer Center Support Programs:   > Cancer Support Group  2nd Tuesday of the month 1pm-2pm, Journey Room

## 2020-03-11 NOTE — Assessment & Plan Note (Addendum)
1.  High-grade left breast DCIS, ER/PR 2% positive: -Bilateral mammogram on 12/30/2019 showed new calcifications.  Biopsy was done on 01/08/2020.  She underwent left mastectomy on 02/23/2020. -We reviewed pathology report which showed 0.5 cm high-grade DCIS with central necrosis.  Margins negative.  ER/PR was 2% positive.  pTis, P NX. -We had a prolonged discussion about the normal prognosis of left breast DCIS. -I have recommended antiestrogen therapy with anastrozole because of weakly positive ER and PR. -We talked about the side effects including but not limited to hot flashes, musculoskeletal pains, decreased bone mineral density and rare chance of thromboembolic phenomena. -We will start her on anastrozole 1 mg daily for 5 years.  We have sent it to her pharmacy. -I will reevaluate her in 2 months with repeat labs.  I will also plan to check vitamin D level. -We will also obtain a baseline DEXA scan.  2.  Left triple negative breast cancer: -Status post lumpectomy on 11/12/2013. -Status post 6 cycles of TAC from 12/31/2013 through 04/20/2014 with Adriamycin held after first 3 cycles secondary to decreased ejection fraction. -XRT from 05/12/2014 through 07/03/2014.

## 2020-03-22 ENCOUNTER — Ambulatory Visit (HOSPITAL_COMMUNITY)
Admission: RE | Admit: 2020-03-22 | Discharge: 2020-03-22 | Disposition: A | Discharge status: Home / Self Care | Payer: 59 | Source: Ambulatory Visit | Attending: Hematology | Admitting: Hematology

## 2020-03-22 ENCOUNTER — Other Ambulatory Visit: Payer: Self-pay

## 2020-03-22 DIAGNOSIS — Z780 Asymptomatic menopausal state: Secondary | ICD-10-CM | POA: Insufficient documentation

## 2020-03-25 ENCOUNTER — Telehealth: Payer: Self-pay | Admitting: Gastroenterology

## 2020-03-25 NOTE — Telephone Encounter (Signed)
Patient has upcoming appointment and wants to know if she was supposed to have labs done prior.    Has not received an order and did not want to miss

## 2020-03-25 NOTE — Telephone Encounter (Signed)
Called pt and notified her that we do not have any labs that are ordered at his time but reminded her of the labs that are ordered from the cancer center for next month. She stated she understood and canceled her appt with our office for 4/6 and would call us if she needs [place]

## 2020-03-30 ENCOUNTER — Ambulatory Visit: Payer: 59 | Admitting: Gastroenterology

## 2020-04-08 ENCOUNTER — Encounter: Payer: Self-pay | Admitting: General Surgery

## 2020-04-08 ENCOUNTER — Ambulatory Visit (INDEPENDENT_AMBULATORY_CARE_PROVIDER_SITE_OTHER): Payer: Self-pay | Admitting: General Surgery

## 2020-04-08 ENCOUNTER — Other Ambulatory Visit: Payer: Self-pay

## 2020-04-08 VITALS — BP 135/90 | HR 83 | Temp 97.4°F | Resp 12 | Ht 61.0 in | Wt 147.0 lb

## 2020-04-08 DIAGNOSIS — Z09 Encounter for follow-up examination after completed treatment for conditions other than malignant neoplasm: Secondary | ICD-10-CM

## 2020-04-08 NOTE — Progress Notes (Signed)
Subjective:     Sally [name]  Patient is here for postoperative wound check, status post left modified radical mastectomy.  Patient is doing well.  No drainage has been noted from the incision.  No left arm swelling or pain has been noted.  She is starting exercises to increase her range of motion in the left shoulder. Objective:    BP 135/90   Pulse 83   Temp (!) 97.4 F (36.3 C) (Oral)   Resp 12   Ht 5\' 1"  (1.549 m)   Wt 147 lb (66.7 kg)   SpO2 95%   BMI 27.78 kg/m   General:  alert, cooperative and no distress  Left mastectomy is healing well.  Some increased density in the medial aspect of the incision which just appears to be scar tissue.     Assessment:    Doing well postoperatively.    Plan:   Continue left arm exercises.  Expect return to work without restrictions on 05/16/2020.  Follow-up here as needed.

## 2020-04-13 ENCOUNTER — Telehealth: Payer: Self-pay

## 2020-04-13 NOTE — Telephone Encounter (Signed)
Faxed signed order to continue mastectomy products

## 2020-04-27 ENCOUNTER — Telehealth: Payer: Self-pay

## 2020-04-27 NOTE — Telephone Encounter (Signed)
Pathology report faxed to [facility] at this time per request of patient. [phone].

## 2020-05-04 ENCOUNTER — Other Ambulatory Visit: Payer: Self-pay

## 2020-05-04 ENCOUNTER — Inpatient Hospital Stay (HOSPITAL_COMMUNITY): Payer: 59 | Attending: Hematology

## 2020-05-04 DIAGNOSIS — Z9011 Acquired absence of right breast and nipple: Secondary | ICD-10-CM | POA: Diagnosis not present

## 2020-05-04 DIAGNOSIS — Z79811 Long term (current) use of aromatase inhibitors: Secondary | ICD-10-CM | POA: Insufficient documentation

## 2020-05-04 DIAGNOSIS — K5900 Constipation, unspecified: Secondary | ICD-10-CM | POA: Insufficient documentation

## 2020-05-04 DIAGNOSIS — Z923 Personal history of irradiation: Secondary | ICD-10-CM | POA: Insufficient documentation

## 2020-05-04 DIAGNOSIS — F17210 Nicotine dependence, cigarettes, uncomplicated: Secondary | ICD-10-CM | POA: Insufficient documentation

## 2020-05-04 DIAGNOSIS — Z90710 Acquired absence of both cervix and uterus: Secondary | ICD-10-CM | POA: Diagnosis not present

## 2020-05-04 DIAGNOSIS — Z170 Estrogen receptor positive status [ER+]: Secondary | ICD-10-CM | POA: Diagnosis not present

## 2020-05-04 DIAGNOSIS — D0512 Intraductal carcinoma in situ of left breast: Secondary | ICD-10-CM | POA: Insufficient documentation

## 2020-05-04 DIAGNOSIS — Z853 Personal history of malignant neoplasm of breast: Secondary | ICD-10-CM | POA: Diagnosis not present

## 2020-05-04 LAB — CBC WITH DIFFERENTIAL/PLATELET
Abs Immature Granulocytes: 0.01 10*3/uL (ref 0.00–0.07)
Basophils Absolute: 0.1 10*3/uL (ref 0.0–0.1)
Basophils Relative: 1 %
Eosinophils Absolute: 0.2 10*3/uL (ref 0.0–0.5)
Eosinophils Relative: 3 %
HCT: 40.8 % (ref 36.0–46.0)
Hemoglobin: 13.4 g/dL (ref 12.0–15.0)
Immature Granulocytes: 0 %
Lymphocytes Relative: 26 %
Lymphs Abs: 1.4 10*3/uL (ref 0.7–4.0)
MCH: 30.4 pg (ref 26.0–34.0)
MCHC: 32.8 g/dL (ref 30.0–36.0)
MCV: 92.5 fL (ref 80.0–100.0)
Monocytes Absolute: 0.5 10*3/uL (ref 0.1–1.0)
Monocytes Relative: 9 %
Neutro Abs: 3.3 10*3/uL (ref 1.7–7.7)
Neutrophils Relative %: 61 %
Platelets: 214 10*3/uL (ref 150–400)
RBC: 4.41 MIL/uL (ref 3.87–5.11)
RDW: 12.3 % (ref 11.5–15.5)
WBC: 5.3 10*3/uL (ref 4.0–10.5)
nRBC: 0 % (ref 0.0–0.2)

## 2020-05-04 LAB — COMPREHENSIVE METABOLIC PANEL
ALT: 29 U/L (ref 0–44)
AST: 26 U/L (ref 15–41)
Albumin: 4.6 g/dL (ref 3.5–5.0)
Alkaline Phosphatase: 182 U/L — ABNORMAL HIGH (ref 38–126)
Anion gap: 10 (ref 5–15)
BUN: 20 mg/dL (ref 8–23)
CO2: 26 mmol/L (ref 22–32)
Calcium: 9.7 mg/dL (ref 8.9–10.3)
Chloride: 101 mmol/L (ref 98–111)
Creatinine, Ser: 0.99 mg/dL (ref 0.44–1.00)
GFR calc Af Amer: >60 mL/min (ref >60)
GFR calc non Af Amer: >60 mL/min (ref >60)
Glucose, Bld: 114 mg/dL — ABNORMAL HIGH (ref 70–99)
Potassium: 3.6 mmol/L (ref 3.5–5.1)
Sodium: 137 mmol/L (ref 135–145)
Total Bilirubin: 0.8 mg/dL (ref 0.3–1.2)
Total Protein: 7.9 g/dL (ref 6.5–8.1)

## 2020-05-04 LAB — VITAMIN D 25 HYDROXY (VIT D DEFICIENCY, FRACTURES): Vit D, 25-Hydroxy: 18.68 ng/mL — ABNORMAL LOW (ref 30–100)

## 2020-05-11 ENCOUNTER — Inpatient Hospital Stay (HOSPITAL_BASED_OUTPATIENT_CLINIC_OR_DEPARTMENT_OTHER): Payer: 59 | Admitting: Hematology

## 2020-05-11 ENCOUNTER — Other Ambulatory Visit: Payer: Self-pay

## 2020-05-11 ENCOUNTER — Encounter (HOSPITAL_COMMUNITY): Payer: Self-pay | Admitting: Hematology

## 2020-05-11 VITALS — BP 117/81 | HR 106 | Temp 97.8°F | Resp 18 | Wt 146.6 lb

## 2020-05-11 DIAGNOSIS — D0512 Intraductal carcinoma in situ of left breast: Secondary | ICD-10-CM

## 2020-05-11 NOTE — Assessment & Plan Note (Signed)
1.  High-grade left breast DCIS, ER/PR 2% positive: -Left mastectomy on 02/23/2020 with pathology showing 0.5 cm high-grade DCIS with central necrosis.  Margins negative.  ER/PR 2% positive.  pTis PMX. -Anastrozole started on 03/11/2020. -She denies any worsening of hot flashes.  No musculoskeletal symptoms.  She has developed some constipation since the start of anastrozole. -We reviewed her DEXA scan dated 03/22/2020 which shows T score 0.0. -Vitamin D is low at 18.8.  She is taking vitamin D 1000 units daily. -I have recommended increasing vitamin D to 2000 units daily. -We will see her back in 6 months for follow-up with repeat labs and physical exam.  2.  Left triple negative breast cancer: -Status post lumpectomy on 11/12/2013. -Status post 6 cycles of TAC from 12/31/2013 through 04/20/2014. -XRT from 05/02/2014 through 07/03/2014.

## 2020-05-11 NOTE — Patient Instructions (Addendum)
[place] at [facility] Discharge Instructions  You were seen today by Dr. Delton Coombes. He went over your recent results. He will see you back in 6 months for labs and follow up. Continue with your anastrozole as prescribed. Double your Vitamin D supplementation.    Thank you for choosing [place] at [facility] to provide your oncology and hematology care.  To afford each patient quality time with our provider, please arrive at least 15 minutes before your scheduled appointment time.   If you have a lab appointment with the [facility] please come in thru the  Main Entrance and check in at the main information desk  You need to re-schedule your appointment should you arrive 10 or more minutes late.  We strive to give you quality time with our providers, and arriving late affects you and other patients whose appointments are after yours.  Also, if you no show three or more times for appointments you may be dismissed from the clinic at the providers discretion.     Again, thank you for choosing [facility].  Our hope is that these requests will decrease the amount of time that you wait before being seen by our physicians.       _____________________________________________________________  Should you have questions after your visit to [facility], please contact our office at [phone] between the hours of 8:00 a.m. and 4:30 p.m.  Voicemails left after 4:00 p.m. will not be returned until the following business day.  For prescription refill requests, have your pharmacy contact our office and allow 72 hours.    Cancer Center Support Programs:   > Cancer Support Group  2nd Tuesday of the month 1pm-2pm, Journey Room

## 2020-05-11 NOTE — Progress Notes (Signed)
[place], [place] [address]   CLINIC:  Medical Oncology/Hematology  PCP:  Rosita Fire, MD [place] / [place] [address] [phone]   REASON FOR VISIT:  Follow-up for left breast high-grade DCIS.  CURRENT THERAPY: Continue hormone blocker therapy, anastrozole  BRIEF ONCOLOGIC HISTORY:  Oncology History  Invasive ductal carcinoma of left breast ([facility])  09/24/2013 Initial Diagnosis   Invasive ductal carcinoma of left breast   11/12/2013 Surgery   Left breast lumpectomy by Dr. Arnoldo Morale- High grade invasive ductal carcinoma less than 0.1 cm from posterior and medial margins, 0/1 lymph nodes, triple negative   12/31/2013 - 04/20/2014 Chemotherapy   TAC every 21 days.  Adriamycin dropped following cycle 3 for a decreased EF compared to baseline.   05/12/2014 - 07/03/2014 Radiation Therapy   XRT     CANCER STAGING: Cancer Staging Invasive ductal carcinoma of left breast (HCC) Staging form: Breast, AJCC 7th Edition - Clinical: Stage IA (T1c, N0, cM0) - Signed by Baird Cancer, PA-C on 01/06/2014   INTERVAL HISTORY:  Ms. Eline Chaparro Toppins, a 64 y.o. female, returns for routine follow-up for her left breast high-grade DCIS. Aziah was last seen on 03/11/2020.   She notes that they removed the drainage tube from her left breast 1 month ago. She is also taking Vitamin D, but her labs are still low.   She began taking anastrozole as prescribed.   REVIEW OF SYSTEMS:  Review of Systems  Constitutional: Negative for appetite change, chills, diaphoresis, fatigue and fever.  HENT:   Negative for mouth sores, sore throat and trouble swallowing.   Eyes: Negative for eye problems.  Respiratory: Negative for cough, shortness of breath and wheezing.   Cardiovascular: Negative for chest pain, leg swelling and palpitations.  Gastrointestinal: Positive for constipation. Negative for abdominal pain, diarrhea, nausea and vomiting.   Genitourinary: Negative for bladder incontinence, dysuria and frequency.   Musculoskeletal: Negative for arthralgias, back pain and myalgias.  Skin: Negative for rash.  Neurological: Negative for dizziness, extremity weakness, headaches and numbness.  Hematological: Does not bruise/bleed easily.  Psychiatric/Behavioral: Negative for depression and sleep disturbance. The patient is not nervous/anxious.     PAST MEDICAL/SURGICAL HISTORY:  Past Medical History:  Diagnosis Date  . Cancer ([facility]) 11/12/2013   left breast cancer  . 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  Outpatient MUGA scan performed on 02/25/2014 showed a drop in ejection fraction from 72% to 59% and for that reason doxorubicin was discontinued from her chemotherapy regimen starting on cycle 4.   . Chronic sinusitis   . Diabetes mellitus without complication (HCC)    diet controlled  . Hives 05/2014  . Hypertension   . Invasive ductal carcinoma of left breast ([facility]) 12/11/2013  . Personal history of chemotherapy   . Personal history of radiation therapy   . Sarcoidosis    diagnosed in the 1990s. doesn't recall treatment  . Tobacco abuse 10/21/2014   Past Surgical History:  Procedure Laterality Date  . ABDOMINAL HYSTERECTOMY    . COLONOSCOPY  01/09/2012   Dr. Oneida Alar: diverticulosis, hyperplastic polyps, hemorrhoids. next TCS in 10 years.   Marland Kitchen DILATION AND CURETTAGE OF UTERUS    . ECTOPIC PREGNANCY SURGERY    . GANGLION CYST EXCISION    . INGUINAL HERNIA REPAIR Left   . left leg surgery     17 yrs of age  . MASTECTOMY MODIFIED RADICAL Left 02/23/2020   Procedure:  MASTECTOMY MODIFIED RADICAL;  Surgeon: Aviva Signs, MD;  Location: [facility];  Service: General;  Laterality: Left;  . PARTIAL MASTECTOMY WITH NEEDLE LOCALIZATION AND AXILLARY SENTINEL LYMPH NODE BX Left 11/12/2013   Procedure: PARTIAL MASTECTOMY WITH NEEDLE LOCALIZATION AND AXILLARY SENTINEL LYMPH NODE BX;  Surgeon: Jamesetta So, MD;  Location: [facility];  Service: General;  Laterality: Left;  . PORT-A-CATH REMOVAL Right 12/20/2016   Procedure: MINOR REMOVAL PORT-A-CATH;  Surgeon: Aviva Signs, MD;  Location: [facility];  Service: General;  Laterality: Right;  . PORTACATH PLACEMENT Right 12/29/2013   Procedure: INSERTION PORT-A-CATH;  Surgeon: Jamesetta So, MD;  Location: [facility];  Service: General;  Laterality: Right;    SOCIAL HISTORY:  Social History   Socioeconomic History  . Marital status: Divorced    Spouse name: Not on file  . Number of children: Not on file  . Years of education: Not on file  . Highest education level: Not on file  Occupational History  . Not on file  Tobacco Use  . Smoking status: Current Every Day Smoker    Packs/day: 0.25    Types: Cigarettes  . Smokeless tobacco: Never Used  Substance and Sexual Activity  . Alcohol use: Yes    Comment: social once a week  . Drug use: No  . Sexual activity: Yes    Birth control/protection: Surgical  Other Topics Concern  . Not on file  Social History Narrative  . Not on file   Social Determinants of Health   Financial Resource Strain:   . Difficulty of Paying Living Expenses:   Food Insecurity:   . Worried About Charity fundraiser in the Last Year:   . Arboriculturist in the Last Year:   Transportation Needs:   . Film/video editor (Medical):   Marland Kitchen Lack of Transportation (Non-Medical):   Physical Activity:   . Days of Exercise per Week:   . Minutes of Exercise per Session:   Stress:   . Feeling of Stress :   Social Connections:   . Frequency of Communication with Friends and Family:   . Frequency of Social Gatherings with Friends and Family:   . Attends Religious Services:   . Active Member of Clubs or Organizations:   . Attends Archivist Meetings:   Marland Kitchen Marital Status:   Intimate Partner Violence:   . Fear of Current or Ex-Partner:   . Emotionally Abused:   Marland Kitchen Physically Abused:   . Sexually Abused:      FAMILY HISTORY:  Family History  Problem Relation Age of Onset  . Diabetes type II Mother   . Diabetes type II Father   . Prostate cancer Father   . Colon cancer Neg Hx   . Liver cancer Neg Hx     CURRENT MEDICATIONS:  Current Outpatient Medications  Medication Sig Dispense Refill  . amLODipine (NORVASC) 10 MG tablet Take 10 mg by mouth daily.     Marland Kitchen anastrozole (ARIMIDEX) 1 MG tablet Take 1 tablet (1 mg total) by mouth daily. 30 tablet 1  . atorvastatin (LIPITOR) 40 MG tablet Take 40 mg by mouth daily.    . cetirizine (ZYRTEC) 10 MG tablet Take 10 mg by mouth daily.    Marland Kitchen lisinopril-hydrochlorothiazide (PRINZIDE,ZESTORETIC) 20-12.5 MG tablet Take 1 tablet by mouth daily.     . metFORMIN (GLUCOPHAGE) 850 MG tablet Take 850 mg by mouth daily with supper.     Marland Kitchen HYDROcodone-acetaminophen (NORCO) 5-325 MG tablet  Take 1 tablet by mouth every 4 (four) hours as needed for moderate pain. (Patient not taking: Reported on 05/11/2020) 30 tablet 0   No current facility-administered medications for this visit.    ALLERGIES:  Allergies  Allergen Reactions  . Lemon Flavor     Peels tongue  . Other Itching and Swelling    Beer  . Shrimp [Shellfish Allergy]     Feels like a stick is in her throat    PHYSICAL EXAM:  Performance status (ECOG): 0 - Asymptomatic  Vitals:   05/11/20 1556  BP: 117/81  Pulse: (!) 106  Resp: 18  Temp: 97.8 F (36.6 C)  SpO2: 99%   Wt Readings from Last 3 Encounters:  05/11/20 146 lb 9.6 oz (66.5 kg)  04/08/20 147 lb (66.7 kg)  03/11/20 146 lb (66.2 kg)   Physical Exam Constitutional:      General: She is not in acute distress.    Appearance: Normal appearance. She is normal weight. She is not ill-appearing.  HENT:     Mouth/Throat:     Mouth: Mucous membranes are moist.     Pharynx: No oropharyngeal exudate or posterior oropharyngeal erythema.  Eyes:     Extraocular Movements: Extraocular movements intact.     Pupils: Pupils are equal, round,  and reactive to light.  Cardiovascular:     Rate and Rhythm: Normal rate and regular rhythm.     Pulses: Normal pulses.     Heart sounds: Normal heart sounds. No murmur. No friction rub. No gallop.   Pulmonary:     Effort: Pulmonary effort is normal.     Breath sounds: Normal breath sounds. No wheezing, rhonchi or rales.  Abdominal:     Palpations: There is no mass.     Tenderness: There is no abdominal tenderness. There is no guarding.  Musculoskeletal:        General: No swelling or tenderness.     Right lower leg: No edema.     Left lower leg: No edema.  Skin:    Findings: No bruising or erythema.  Neurological:     Mental Status: She is alert and oriented to person, place, and time.     Sensory: No sensory deficit.  Psychiatric:        Mood and Affect: Mood normal.        Behavior: Behavior normal.        Thought Content: Thought content normal.        Judgment: Judgment normal.   Left mastectomy site is within normal limits.  Right breast has no palpable masses.  LABORATORY DATA:  I have reviewed the labs as listed.  CBC Latest Ref Rng & Units 05/04/2020 02/24/2020 02/20/2020  WBC 4.0 - 10.5 K/uL 5.3 11.5(H) 5.9  Hemoglobin 12.0 - 15.0 g/dL 13.4 12.4 13.8  Hematocrit 36.0 - 46.0 % 40.8 36.9 41.7  Platelets 150 - 400 K/uL 214 199 194   CMP Latest Ref Rng & Units 05/04/2020 02/24/2020 02/20/2020  Glucose 70 - 99 mg/dL 114(H) 153(H) 158(H)  BUN 8 - 23 mg/dL 20 23 23   Creatinine 0.44 - 1.00 mg/dL 0.99 1.00 1.01(H)  Sodium 135 - 145 mmol/L 137 136 138  Potassium 3.5 - 5.1 mmol/L 3.6 4.0 3.5  Chloride 98 - 111 mmol/L 101 103 102  CO2 22 - 32 mmol/L 26 25 26   Calcium 8.9 - 10.3 mg/dL 9.7 8.7(L) 9.3  Total Protein 6.5 - 8.1 g/dL 7.9 - 7.4  Total Bilirubin 0.3 -  1.2 mg/dL 0.8 - 0.6  Alkaline Phos 38 - 126 U/L 182(H) - 161(H)  AST 15 - 41 U/L 26 - 20  ALT 0 - 44 U/L 29 - 23    DIAGNOSTIC IMAGING:  I have independently reviewed the scans and discussed with the  patient.  ASSESSMENT & PLAN:  Breast neoplasm, Tis (DCIS), left 1.  High-grade left breast DCIS, ER/PR 2% positive: -Left mastectomy on 02/23/2020 with pathology showing 0.5 cm high-grade DCIS with central necrosis.  Margins negative.  ER/PR 2% positive.  pTis PMX. -Anastrozole started on 03/11/2020. -She denies any worsening of hot flashes.  No musculoskeletal symptoms.  She has developed some constipation since the start of anastrozole. -We reviewed her DEXA scan dated 03/22/2020 which shows T score 0.0. -Vitamin D is low at 18.8.  She is taking vitamin D 1000 units daily. -I have recommended increasing vitamin D to 2000 units daily. -We will see her back in 6 months for follow-up with repeat labs and physical exam.  2.  Left triple negative breast cancer: -Status post lumpectomy on 11/12/2013. -Status post 6 cycles of TAC from 12/31/2013 through 04/20/2014. -XRT from 05/02/2014 through 07/03/2014.    Orders placed this encounter:  Orders Placed This Encounter  Procedures  . CBC with Differential  . Comprehensive metabolic panel  . Vitamin D 25 hydroxy     Derek Jack, MD, 05/11/20 4:33 PM  [place] [phone]   I, Jacqualyn Posey, am acting as a scribe for Dr. Sanda Linger.  I, Derek Jack MD, have reviewed the above documentation for accuracy and completeness, and I agree with the above.

## 2020-05-13 ENCOUNTER — Other Ambulatory Visit (HOSPITAL_COMMUNITY): Payer: Self-pay | Admitting: Hematology

## 2020-05-13 DIAGNOSIS — D0512 Intraductal carcinoma in situ of left breast: Secondary | ICD-10-CM

## 2020-05-14 ENCOUNTER — Telehealth: Payer: Self-pay | Admitting: *Deleted

## 2020-05-14 NOTE — Telephone Encounter (Signed)
Received DME Standard Written Order on (05/12/20) via of fax from [facility].  Requesting to be signed and returned.  SWO signed and faxed back to [facility] [place] [place].  Confirmation received.  Copy scanned into the chart.//AB/CMA

## 2020-05-20 ENCOUNTER — Encounter: Payer: Self-pay | Admitting: Family Medicine

## 2020-05-21 ENCOUNTER — Telehealth: Payer: Self-pay | Admitting: Family Medicine

## 2020-05-21 NOTE — Telephone Encounter (Signed)
[facility] fax form [employer] stating that there are dates that were not covered in her original FMLA forms. The dates were 02/20/2020 through2/28/2021 and 05/17/2020 through 05/19/2020.  02/20/2020 through 20/25/2021 is covered d/t quarantine for COVID restrictions. Was not sure about the other dates. Called and spoke to pt and those dates are her original dates off except the 5/26 th date as they sent her home b/c she needed a work note to return. Note was provided for pt and she will call the to f/u on those dates to be taken off.   Dates corrected and faxed back to [facility] with confirmation.

## 2020-05-28 ENCOUNTER — Telehealth: Payer: Self-pay | Admitting: *Deleted

## 2020-05-28 NOTE — Telephone Encounter (Signed)
Received DME Standard Written Order Waynetta Sandy) on (05/26/20) via of fax from [facility] requesting provider's signature.  Given to the provider to complete.  Orders signed and completed.  Faxed to [facility] [place].  Confirmation received and copy scanned into the chart.//AB/CMA

## 2020-07-29 ENCOUNTER — Telehealth: Payer: Self-pay | Admitting: *Deleted

## 2020-07-29 NOTE — Telephone Encounter (Signed)
Received on (07/26/20) a request via of fax from [facility] for medical records from last office visit.    Recent office notes faxed and confirmation received.//AB/CMA

## 2020-08-31 ENCOUNTER — Encounter (HOSPITAL_COMMUNITY): Payer: Self-pay

## 2020-11-04 ENCOUNTER — Other Ambulatory Visit (HOSPITAL_COMMUNITY): Payer: 59

## 2020-11-11 ENCOUNTER — Ambulatory Visit (HOSPITAL_COMMUNITY): Payer: 59 | Admitting: Hematology

## 2020-12-02 ENCOUNTER — Inpatient Hospital Stay (HOSPITAL_COMMUNITY): Payer: 59 | Attending: Hematology and Oncology

## 2020-12-02 ENCOUNTER — Inpatient Hospital Stay (HOSPITAL_BASED_OUTPATIENT_CLINIC_OR_DEPARTMENT_OTHER): Payer: 59 | Admitting: Oncology

## 2020-12-02 ENCOUNTER — Encounter (HOSPITAL_COMMUNITY): Payer: Self-pay | Admitting: Oncology

## 2020-12-02 ENCOUNTER — Other Ambulatory Visit (HOSPITAL_COMMUNITY): Payer: Self-pay | Admitting: Oncology

## 2020-12-02 ENCOUNTER — Other Ambulatory Visit: Payer: Self-pay

## 2020-12-02 VITALS — BP 112/79 | HR 79 | Temp 97.7°F | Resp 16 | Wt 148.2 lb

## 2020-12-02 DIAGNOSIS — Z79811 Long term (current) use of aromatase inhibitors: Secondary | ICD-10-CM | POA: Diagnosis not present

## 2020-12-02 DIAGNOSIS — Z9221 Personal history of antineoplastic chemotherapy: Secondary | ICD-10-CM | POA: Diagnosis not present

## 2020-12-02 DIAGNOSIS — Z9012 Acquired absence of left breast and nipple: Secondary | ICD-10-CM | POA: Insufficient documentation

## 2020-12-02 DIAGNOSIS — Z90710 Acquired absence of both cervix and uterus: Secondary | ICD-10-CM | POA: Insufficient documentation

## 2020-12-02 DIAGNOSIS — Z853 Personal history of malignant neoplasm of breast: Secondary | ICD-10-CM | POA: Insufficient documentation

## 2020-12-02 DIAGNOSIS — Z923 Personal history of irradiation: Secondary | ICD-10-CM | POA: Diagnosis not present

## 2020-12-02 DIAGNOSIS — D0512 Intraductal carcinoma in situ of left breast: Secondary | ICD-10-CM | POA: Diagnosis not present

## 2020-12-02 DIAGNOSIS — F17210 Nicotine dependence, cigarettes, uncomplicated: Secondary | ICD-10-CM | POA: Insufficient documentation

## 2020-12-02 LAB — CBC WITH DIFFERENTIAL/PLATELET
Abs Immature Granulocytes: 0.03 10*3/uL (ref 0.00–0.07)
Basophils Absolute: 0 10*3/uL (ref 0.0–0.1)
Basophils Relative: 1 %
Eosinophils Absolute: 0.2 10*3/uL (ref 0.0–0.5)
Eosinophils Relative: 3 %
HCT: 39.6 % (ref 36.0–46.0)
Hemoglobin: 13.1 g/dL (ref 12.0–15.0)
Immature Granulocytes: 0 %
Lymphocytes Relative: 29 %
Lymphs Abs: 2.1 10*3/uL (ref 0.7–4.0)
MCH: 30.7 pg (ref 26.0–34.0)
MCHC: 33.1 g/dL (ref 30.0–36.0)
MCV: 92.7 fL (ref 80.0–100.0)
Monocytes Absolute: 0.7 10*3/uL (ref 0.1–1.0)
Monocytes Relative: 9 %
Neutro Abs: 4.2 10*3/uL (ref 1.7–7.7)
Neutrophils Relative %: 58 %
Platelets: 209 10*3/uL (ref 150–400)
RBC: 4.27 MIL/uL (ref 3.87–5.11)
RDW: 13.1 % (ref 11.5–15.5)
WBC: 7.2 10*3/uL (ref 4.0–10.5)
nRBC: 0 % (ref 0.0–0.2)

## 2020-12-02 LAB — COMPREHENSIVE METABOLIC PANEL
ALT: 24 U/L (ref 0–44)
AST: 21 U/L (ref 15–41)
Albumin: 4.5 g/dL (ref 3.5–5.0)
Alkaline Phosphatase: 149 U/L — ABNORMAL HIGH (ref 38–126)
Anion gap: 10 (ref 5–15)
BUN: 33 mg/dL — ABNORMAL HIGH (ref 8–23)
CO2: 28 mmol/L (ref 22–32)
Calcium: 9.9 mg/dL (ref 8.9–10.3)
Chloride: 99 mmol/L (ref 98–111)
Creatinine, Ser: 1.1 mg/dL — ABNORMAL HIGH (ref 0.44–1.00)
GFR, Estimated: 57 mL/min — ABNORMAL LOW (ref >60)
Glucose, Bld: 104 mg/dL — ABNORMAL HIGH (ref 70–99)
Potassium: 3.7 mmol/L (ref 3.5–5.1)
Sodium: 137 mmol/L (ref 135–145)
Total Bilirubin: 0.7 mg/dL (ref 0.3–1.2)
Total Protein: 7.7 g/dL (ref 6.5–8.1)

## 2020-12-02 LAB — VITAMIN D 25 HYDROXY (VIT D DEFICIENCY, FRACTURES): Vit D, 25-Hydroxy: 34.03 ng/mL (ref 30–100)

## 2020-12-02 NOTE — Progress Notes (Signed)
[name], [place] [address]   CLINIC:  Medical Oncology/Hematology  PCP:  Rosita Fire, MD [place] / [place] [id number] [phone]   REASON FOR VISIT:  Follow-up for left breast high-grade DCIS.  CURRENT THERAPY: Continue hormone blocker therapy, anastrozole  BRIEF ONCOLOGIC HISTORY:  Oncology History  Invasive ductal carcinoma of left breast ([facility])  09/24/2013 Initial Diagnosis   Invasive ductal carcinoma of left breast   11/12/2013 Surgery   Left breast lumpectomy by Dr. Arnoldo Morale- High grade invasive ductal carcinoma less than 0.1 cm from posterior and medial margins, 0/1 lymph nodes, triple negative   12/31/2013 - 04/20/2014 Chemotherapy   TAC every 21 days.  Adriamycin dropped following cycle 3 for a decreased EF compared to baseline.   05/12/2014 - 07/03/2014 Radiation Therapy   XRT     CANCER STAGING: Cancer Staging Invasive ductal carcinoma of left breast (HCC) Staging form: Breast, AJCC 7th Edition - Clinical: Stage IA (T1c, N0, cM0) - Signed by Baird Cancer, PA-C on 01/06/2014   INTERVAL HISTORY:  [name], a 64 y.o. female, returns for routine follow-up for her left breast high-grade DCIS. Ahliyah was last seen on 05/11/2020.   She has been doing well since her last visit.  She has tried to be compliant with her vitamin D levels although she reports she has not been taking them over the last week or so.  She denies any pain.  Her appetite is 100% energy levels are 75%.  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  She denies any new concerns with her breasts.  She has occasional pain near her scars.  She began taking anastrozole as prescribed.   REVIEW OF SYSTEMS:  Review of Systems  Constitutional: Positive for fatigue. Negative for appetite change, chills, diaphoresis and fever.  HENT:   Negative for mouth sores, sore throat and trouble swallowing.   Eyes: Negative for eye problems.   Respiratory: Negative for cough, shortness of breath and wheezing.   Cardiovascular: Negative for chest pain, leg swelling and palpitations.  Gastrointestinal: Negative for abdominal pain, constipation, diarrhea, nausea and vomiting.  Genitourinary: Negative for bladder incontinence, dysuria and frequency.   Musculoskeletal: Negative for arthralgias, back pain and myalgias.  Skin: Negative for rash.  Neurological: Positive for numbness (At lumpectomy site). Negative for dizziness, extremity weakness and headaches.  Hematological: Does not bruise/bleed easily.  Psychiatric/Behavioral: Negative for depression and sleep disturbance. The patient is not nervous/anxious.     PAST MEDICAL/SURGICAL HISTORY:  Past Medical History:  Diagnosis Date  . Cancer ([facility]) 11/12/2013   left breast cancer  . 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  Outpatient MUGA scan performed on 02/25/2014 showed a drop in ejection fraction from 72% to 59% and for that reason doxorubicin was discontinued from her chemotherapy regimen starting on cycle 4.   . Chronic sinusitis   . Diabetes mellitus without complication (HCC)    diet controlled  . Hives 05/2014  . Hypertension   . Invasive ductal carcinoma of left breast ([facility]) 12/11/2013  . Personal history of chemotherapy   . Personal history of radiation therapy   . Sarcoidosis    diagnosed in the 1990s. doesn't recall treatment  . Tobacco abuse 10/21/2014   Past Surgical History:  Procedure Laterality Date  . ABDOMINAL HYSTERECTOMY    . COLONOSCOPY  01/09/2012   Dr. Oneida Alar: diverticulosis, hyperplastic polyps, hemorrhoids. next TCS in 10 years.   Marland Kitchen  DILATION AND CURETTAGE OF UTERUS    . ECTOPIC PREGNANCY SURGERY    . GANGLION CYST EXCISION    . INGUINAL HERNIA REPAIR Left   . left leg surgery     17 yrs of age  . MASTECTOMY MODIFIED RADICAL Left 02/23/2020   Procedure: MASTECTOMY MODIFIED RADICAL;  Surgeon: Aviva Signs, MD;   Location: [facility];  Service: General;  Laterality: Left;  . PARTIAL MASTECTOMY WITH NEEDLE LOCALIZATION AND AXILLARY SENTINEL LYMPH NODE BX Left 11/12/2013   Procedure: PARTIAL MASTECTOMY WITH NEEDLE LOCALIZATION AND AXILLARY SENTINEL LYMPH NODE BX;  Surgeon: Jamesetta So, MD;  Location: [facility];  Service: General;  Laterality: Left;  . PORT-A-CATH REMOVAL Right 12/20/2016   Procedure: MINOR REMOVAL PORT-A-CATH;  Surgeon: Aviva Signs, MD;  Location: [facility];  Service: General;  Laterality: Right;  . PORTACATH PLACEMENT Right 12/29/2013   Procedure: INSERTION PORT-A-CATH;  Surgeon: Jamesetta So, MD;  Location: [facility];  Service: General;  Laterality: Right;    SOCIAL HISTORY:  Social History   Socioeconomic History  . Marital status: Divorced    Spouse name: Not on file  . Number of children: Not on file  . Years of education: Not on file  . Highest education level: Not on file  Occupational History  . Not on file  Tobacco Use  . Smoking status: Current Every Day Smoker    Packs/day: 0.25    Types: Cigarettes  . Smokeless tobacco: Never Used  Substance and Sexual Activity  . Alcohol use: Yes    Comment: social once a week  . Drug use: No  . Sexual activity: Yes    Birth control/protection: Surgical  Other Topics Concern  . Not on file  Social History Narrative  . Not on file   Social Determinants of Health   Financial Resource Strain: Low Risk   . Difficulty of Paying Living Expenses: Not hard at all  Food Insecurity: No Food Insecurity  . Worried About Charity fundraiser in the Last Year: Never true  . Ran Out of Food in the Last Year: Never true  Transportation Needs: No Transportation Needs  . Lack of Transportation (Medical): No  . Lack of Transportation (Non-Medical): No  Physical Activity: Insufficiently Active  . Days of Exercise per Week: 2 days  . Minutes of Exercise per Session: 30 min  Stress: No Stress Concern Present  . Feeling of Stress : Not at all   Social Connections: Moderately Integrated  . Frequency of Communication with Friends and Family: More than three times a week  . Frequency of Social Gatherings with Friends and Family: More than three times a week  . Attends Religious Services: 1 to 4 times per year  . Active Member of Clubs or Organizations: No  . Attends Archivist Meetings: 1 to 4 times per year  . Marital Status: Divorced  Human resources officer Violence: Not At Risk  . Fear of Current or Ex-Partner: No  . Emotionally Abused: No  . Physically Abused: No  . Sexually Abused: No    FAMILY HISTORY:  Family History  Problem Relation Age of Onset  . Diabetes type II Mother   . Diabetes type II Father   . Prostate cancer Father   . Colon cancer Neg Hx   . Liver cancer Neg Hx     CURRENT MEDICATIONS:  Current Outpatient Medications  Medication Sig Dispense Refill  . amLODipine (NORVASC) 10 MG tablet Take 10 mg by mouth daily.     Marland Kitchen  anastrozole (ARIMIDEX) 1 MG tablet TAKE 1 TABLET BY MOUTH  DAILY 60 tablet 5  . atorvastatin (LIPITOR) 40 MG tablet Take 40 mg by mouth daily.    . cetirizine (ZYRTEC) 10 MG tablet Take 10 mg by mouth daily.    Marland Kitchen HYDROcodone-acetaminophen (NORCO) 5-325 MG tablet Take 1 tablet by mouth every 4 (four) hours as needed for moderate pain. 30 tablet 0  . lisinopril-hydrochlorothiazide (PRINZIDE,ZESTORETIC) 20-12.5 MG tablet Take 1 tablet by mouth daily.     . metFORMIN (GLUCOPHAGE) 850 MG tablet Take 850 mg by mouth daily with supper.      No current facility-administered medications for this visit.    ALLERGIES:  Allergies  Allergen Reactions  . Lemon Flavor     Peels tongue  . Other Itching and Swelling    Beer  . Shrimp [Shellfish Allergy]     Feels like a stick is in her throat    PHYSICAL EXAM:  Performance status (ECOG): 0 - Asymptomatic  Vitals:   12/02/20 1336  BP: 112/79  Pulse: 79  Resp: 16  Temp: 97.7 F (36.5 C)  SpO2: 100%   Wt Readings from Last 3  Encounters:  12/02/20 148 lb 3.2 oz (67.2 kg)  05/11/20 146 lb 9.6 oz (66.5 kg)  04/08/20 147 lb (66.7 kg)   Physical Exam Constitutional:      General: She is not in acute distress.    Appearance: Normal appearance. She is normal weight. She is not ill-appearing.  HENT:     Mouth/Throat:     Mouth: Mucous membranes are moist.     Pharynx: No oropharyngeal exudate or posterior oropharyngeal erythema.  Eyes:     Extraocular Movements: Extraocular movements intact.     Pupils: Pupils are equal, round, and reactive to light.  Cardiovascular:     Rate and Rhythm: Normal rate and regular rhythm.     Pulses: Normal pulses.     Heart sounds: Normal heart sounds. No murmur heard. No friction rub. No gallop.   Pulmonary:     Effort: Pulmonary effort is normal.     Breath sounds: Normal breath sounds. No wheezing, rhonchi or rales.  Abdominal:     Palpations: There is no mass.     Tenderness: There is no abdominal tenderness. There is no guarding.  Musculoskeletal:        General: No swelling or tenderness.     Right lower leg: No edema.     Left lower leg: No edema.  Skin:    Findings: No bruising or erythema.  Neurological:     Mental Status: She is alert and oriented to person, place, and time.     Sensory: No sensory deficit.  Psychiatric:        Mood and Affect: Mood normal.        Behavior: Behavior normal.        Thought Content: Thought content normal.        Judgment: Judgment normal.   Left mastectomy site is within normal limits.  Right breast has no palpable masses.  LABORATORY DATA:  I have reviewed the labs as listed.  CBC Latest Ref Rng & Units 12/02/2020 05/04/2020 02/24/2020  WBC 4.0 - 10.5 K/uL 7.2 5.3 11.5(H)  Hemoglobin 12.0 - 15.0 g/dL 13.1 13.4 12.4  Hematocrit 36.0 - 46.0 % 39.6 40.8 36.9  Platelets 150 - 400 K/uL 209 214 199   CMP Latest Ref Rng & Units 05/04/2020 02/24/2020 02/20/2020  Glucose 70 - 99  mg/dL 114(H) 153(H) 158(H)  BUN 8 - 23 mg/dL 20 23 23    Creatinine 0.44 - 1.00 mg/dL 0.99 1.00 1.01(H)  Sodium 135 - 145 mmol/L 137 136 138  Potassium 3.5 - 5.1 mmol/L 3.6 4.0 3.5  Chloride 98 - 111 mmol/L 101 103 102  CO2 22 - 32 mmol/L 26 25 26   Calcium 8.9 - 10.3 mg/dL 9.7 8.7(L) 9.3  Total Protein 6.5 - 8.1 g/dL 7.9 - 7.4  Total Bilirubin 0.3 - 1.2 mg/dL 0.8 - 0.6  Alkaline Phos 38 - 126 U/L 182(H) - 161(H)  AST 15 - 41 U/L 26 - 20  ALT 0 - 44 U/L 29 - 23    DIAGNOSTIC IMAGING:  I have independently reviewed the scans and discussed with the patient.  ASSESSMENT & PLAN:  .  High-grade left breast DCIS, ER/PR 2% positive: -Left mastectomy on 02/23/2020 with pathology showing 0.5 cm high-grade DCIS with central necrosis.  Margins negative.  ER/PR 2% positive.  pTis PMX. -Anastrozole started on 03/11/2020. -She denies any worsening of hot flashes.  No musculoskeletal symptoms.   -DEXA scan dated 03/22/2020 which shows T score 0.0. -Vitamin D was low at 18.8 during her last visit.  Reports intermittently taking her vitamin D supplements.  Vitamin D level from today is pending.   -Continue vitamin D to 2000 units daily. -She will be due for a mammogram of her right breast in January 2022. -Orders placed.  Disposition: -RTC for telephone visit after mammogram to discuss results. -RTC in 6 months for follow-up with repeat labs (CBC, CMP, vitamin D level) and physical exam.  No problem-specific Assessment & Plan notes found for this encounter.   Greater than 50% was spent in counseling and coordination of care with this patient including but not limited to discussion of the relevant topics above (See A&P) including, but not limited to diagnosis and management of acute and chronic medical conditions.   Orders placed this encounter:  No orders of the defined types were placed in this encounter.  Faythe Casa, NP 12/02/2020 3:58 PM [place] [phone]

## 2021-01-05 ENCOUNTER — Other Ambulatory Visit (HOSPITAL_COMMUNITY): Payer: Self-pay | Admitting: Oncology

## 2021-01-05 DIAGNOSIS — Z1231 Encounter for screening mammogram for malignant neoplasm of breast: Secondary | ICD-10-CM

## 2021-01-11 ENCOUNTER — Ambulatory Visit (HOSPITAL_COMMUNITY): Payer: 59

## 2021-01-11 ENCOUNTER — Encounter (HOSPITAL_COMMUNITY): Payer: 59

## 2021-01-13 ENCOUNTER — Other Ambulatory Visit: Payer: Self-pay

## 2021-01-13 ENCOUNTER — Inpatient Hospital Stay (HOSPITAL_COMMUNITY): Payer: 59 | Admitting: Oncology

## 2021-01-13 NOTE — Progress Notes (Unsigned)
[address], [place] [address]   CLINIC:  Medical Oncology/Hematology  PCP:  Rosita Fire, MD [place] / [place] [id number] [phone]   REASON FOR VISIT:  Follow-up for left breast high-grade DCIS.  CURRENT THERAPY: Continue hormone blocker therapy, anastrozole  BRIEF ONCOLOGIC HISTORY:  Oncology History  Invasive ductal carcinoma of left breast ([place])  09/24/2013 Initial Diagnosis   Invasive ductal carcinoma of left breast   11/12/2013 Surgery   Left breast lumpectomy by Dr. Arnoldo Morale- High grade invasive ductal carcinoma less than 0.1 cm from posterior and medial margins, 0/1 lymph nodes, triple negative   12/31/2013 - 04/20/2014 Chemotherapy   TAC every 21 days.  Adriamycin dropped following cycle 3 for a decreased EF compared to baseline.   05/12/2014 - 07/03/2014 Radiation Therapy   XRT     CANCER STAGING: Cancer Staging Invasive ductal carcinoma of left breast (HCC) Staging form: Breast, AJCC 7th Edition - Clinical: Stage IA (T1c, N0, cM0) - Signed by Baird Cancer, PA-C on 01/06/2014   INTERVAL HISTORY:  [name], a 65 y.o. female, returns for routine follow-up for her left breast high-grade DCIS. Sally was last seen on 05/11/2020.   She has been doing well since her last visit.  She has tried to be compliant with her vitamin D levels although she reports she has not been taking them over the last week or so.  She denies any pain.  Her appetite is 100% energy levels are 75%.  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  She denies any new concerns with her breasts.  She has occasional pain near her scars.  She began taking anastrozole as prescribed.   REVIEW OF SYSTEMS:  Review of Systems  Constitutional: Positive for fatigue. Negative for appetite change, chills, diaphoresis and fever.  HENT:   Negative for mouth sores, sore throat and trouble swallowing.   Eyes: Negative for eye problems.   Respiratory: Negative for cough, shortness of breath and wheezing.   Cardiovascular: Negative for chest pain, leg swelling and palpitations.  Gastrointestinal: Negative for abdominal pain, constipation, diarrhea, nausea and vomiting.  Genitourinary: Negative for bladder incontinence, dysuria and frequency.   Musculoskeletal: Negative for arthralgias, back pain and myalgias.  Skin: Negative for rash.  Neurological: Positive for numbness (At lumpectomy site). Negative for dizziness, extremity weakness and headaches.  Hematological: Does not bruise/bleed easily.  Psychiatric/Behavioral: Negative for depression and sleep disturbance. The patient is not nervous/anxious.     PAST MEDICAL/SURGICAL HISTORY:  Past Medical History:  Diagnosis Date  . Cancer ([place]) 11/12/2013   left breast cancer  . 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  Outpatient MUGA scan performed on 02/25/2014 showed a drop in ejection fraction from 72% to 59% and for that reason doxorubicin was discontinued from her chemotherapy regimen starting on cycle 4.   . Chronic sinusitis   . Diabetes mellitus without complication (HCC)    diet controlled  . Hives 05/2014  . Hypertension   . Invasive ductal carcinoma of left breast ([facility]) 12/11/2013  . Personal history of chemotherapy   . Personal history of radiation therapy   . Sarcoidosis    diagnosed in the 1990s. doesn't recall treatment  . Tobacco abuse 10/21/2014   Past Surgical History:  Procedure Laterality Date  . ABDOMINAL HYSTERECTOMY    . COLONOSCOPY  01/09/2012   Dr. Oneida Alar: diverticulosis, hyperplastic polyps, hemorrhoids. next TCS in 10 years.   Marland Kitchen  DILATION AND CURETTAGE OF UTERUS    . ECTOPIC PREGNANCY SURGERY    . GANGLION CYST EXCISION    . INGUINAL HERNIA REPAIR Left   . left leg surgery     17 yrs of age  . MASTECTOMY MODIFIED RADICAL Left 02/23/2020   Procedure: MASTECTOMY MODIFIED RADICAL;  Surgeon: Aviva Signs, MD;   Location: [facility];  Service: General;  Laterality: Left;  . PARTIAL MASTECTOMY WITH NEEDLE LOCALIZATION AND AXILLARY SENTINEL LYMPH NODE BX Left 11/12/2013   Procedure: PARTIAL MASTECTOMY WITH NEEDLE LOCALIZATION AND AXILLARY SENTINEL LYMPH NODE BX;  Surgeon: Jamesetta So, MD;  Location: [facility];  Service: General;  Laterality: Left;  . PORT-A-CATH REMOVAL Right 12/20/2016   Procedure: MINOR REMOVAL PORT-A-CATH;  Surgeon: Aviva Signs, MD;  Location: [facility];  Service: General;  Laterality: Right;  . PORTACATH PLACEMENT Right 12/29/2013   Procedure: INSERTION PORT-A-CATH;  Surgeon: Jamesetta So, MD;  Location: [facility];  Service: General;  Laterality: Right;    SOCIAL HISTORY:  Social History   Socioeconomic History  . Marital status: Divorced    Spouse name: Not on file  . Number of children: Not on file  . Years of education: Not on file  . Highest education level: Not on file  Occupational History  . Not on file  Tobacco Use  . Smoking status: Current Every Day Smoker    Packs/day: 0.25    Types: Cigarettes  . Smokeless tobacco: Never Used  Substance and Sexual Activity  . Alcohol use: Yes    Comment: social once a week  . Drug use: No  . Sexual activity: Yes    Birth control/protection: Surgical  Other Topics Concern  . Not on file  Social History Narrative  . Not on file   Social Determinants of Health   Financial Resource Strain: Low Risk   . Difficulty of Paying Living Expenses: Not hard at all  Food Insecurity: No Food Insecurity  . Worried About Charity fundraiser in the Last Year: Never true  . Ran Out of Food in the Last Year: Never true  Transportation Needs: No Transportation Needs  . Lack of Transportation (Medical): No  . Lack of Transportation (Non-Medical): No  Physical Activity: Insufficiently Active  . Days of Exercise per Week: 2 days  . Minutes of Exercise per Session: 30 min  Stress: No Stress Concern Present  . Feeling of Stress : Not at all   Social Connections: Moderately Integrated  . Frequency of Communication with Friends and Family: More than three times a week  . Frequency of Social Gatherings with Friends and Family: More than three times a week  . Attends Religious Services: 1 to 4 times per year  . Active Member of Clubs or Organizations: No  . Attends Archivist Meetings: 1 to 4 times per year  . Marital Status: Divorced  Human resources officer Violence: Not At Risk  . Fear of Current or Ex-Partner: No  . Emotionally Abused: No  . Physically Abused: No  . Sexually Abused: No    FAMILY HISTORY:  Family History  Problem Relation Age of Onset  . Diabetes type II Mother   . Diabetes type II Father   . Prostate cancer Father   . Colon cancer Neg Hx   . Liver cancer Neg Hx     CURRENT MEDICATIONS:  Current Outpatient Medications  Medication Sig Dispense Refill  . amLODipine (NORVASC) 10 MG tablet Take 10 mg by mouth daily.     Marland Kitchen  anastrozole (ARIMIDEX) 1 MG tablet TAKE 1 TABLET BY MOUTH  DAILY 60 tablet 5  . atorvastatin (LIPITOR) 40 MG tablet Take 40 mg by mouth daily.    . cetirizine (ZYRTEC) 10 MG tablet Take 10 mg by mouth daily.    Marland Kitchen HYDROcodone-acetaminophen (NORCO) 5-325 MG tablet Take 1 tablet by mouth every 4 (four) hours as needed for moderate pain. 30 tablet 0  . lisinopril-hydrochlorothiazide (PRINZIDE,ZESTORETIC) 20-12.5 MG tablet Take 1 tablet by mouth daily.     . metFORMIN (GLUCOPHAGE) 850 MG tablet Take 850 mg by mouth daily with supper.      No current facility-administered medications for this visit.    ALLERGIES:  Allergies  Allergen Reactions  . Lemon Flavor     Peels tongue  . Other Itching and Swelling    Beer  . Shrimp [Shellfish Allergy]     Feels like a stick is in her throat    PHYSICAL EXAM:  Performance status (ECOG): 0 - Asymptomatic  There were no vitals filed for this visit. Wt Readings from Last 3 Encounters:  12/02/20 148 lb 3.2 oz (67.2 kg)  05/11/20 146 lb  9.6 oz (66.5 kg)  04/08/20 147 lb (66.7 kg)   Physical Exam Constitutional:      General: She is not in acute distress.    Appearance: Normal appearance. She is normal weight. She is not ill-appearing.  HENT:     Mouth/Throat:     Mouth: Mucous membranes are moist.     Pharynx: No oropharyngeal exudate or posterior oropharyngeal erythema.  Eyes:     Extraocular Movements: Extraocular movements intact.     Pupils: Pupils are equal, round, and reactive to light.  Cardiovascular:     Rate and Rhythm: Normal rate and regular rhythm.     Pulses: Normal pulses.     Heart sounds: Normal heart sounds. No murmur heard. No friction rub. No gallop.   Pulmonary:     Effort: Pulmonary effort is normal.     Breath sounds: Normal breath sounds. No wheezing, rhonchi or rales.  Abdominal:     Palpations: There is no mass.     Tenderness: There is no abdominal tenderness. There is no guarding.  Musculoskeletal:        General: No swelling or tenderness.     Right lower leg: No edema.     Left lower leg: No edema.  Skin:    Findings: No bruising or erythema.  Neurological:     Mental Status: She is alert and oriented to person, place, and time.     Sensory: No sensory deficit.  Psychiatric:        Mood and Affect: Mood normal.        Behavior: Behavior normal.        Thought Content: Thought content normal.        Judgment: Judgment normal.   Left mastectomy site is within normal limits.  Right breast has no palpable masses.  LABORATORY DATA:  I have reviewed the labs as listed.  CBC Latest Ref Rng & Units 12/02/2020 05/04/2020 02/24/2020  WBC 4.0 - 10.5 K/uL 7.2 5.3 11.5(H)  Hemoglobin 12.0 - 15.0 g/dL 13.1 13.4 12.4  Hematocrit 36.0 - 46.0 % 39.6 40.8 36.9  Platelets 150 - 400 K/uL 209 214 199   CMP Latest Ref Rng & Units 12/02/2020 05/04/2020 02/24/2020  Glucose 70 - 99 mg/dL 104(H) 114(H) 153(H)  BUN 8 - 23 mg/dL 33(H) 20 23  Creatinine 0.44 -  1.00 mg/dL 1.10(H) 0.99 1.00  Sodium 135 -  145 mmol/L 137 137 136  Potassium 3.5 - 5.1 mmol/L 3.7 3.6 4.0  Chloride 98 - 111 mmol/L 99 101 103  CO2 22 - 32 mmol/L 28 26 25   Calcium 8.9 - 10.3 mg/dL 9.9 9.7 8.7(L)  Total Protein 6.5 - 8.1 g/dL 7.7 7.9 -  Total Bilirubin 0.3 - 1.2 mg/dL 0.7 0.8 -  Alkaline Phos 38 - 126 U/L 149(H) 182(H) -  AST 15 - 41 U/L 21 26 -  ALT 0 - 44 U/L 24 29 -    DIAGNOSTIC IMAGING:  I have independently reviewed the scans and discussed with the patient.  ASSESSMENT & PLAN:  .  High-grade left breast DCIS, ER/PR 2% positive: -Left mastectomy on 02/23/2020 with pathology showing 0.5 cm high-grade DCIS with central necrosis.  Margins negative.  ER/PR 2% positive.  pTis PMX. -Anastrozole started on 03/11/2020. -She denies any worsening of hot flashes.  No musculoskeletal symptoms.   -DEXA scan dated 03/22/2020 which shows T score 0.0. -Vitamin D was low at 18.8 during her last visit.  Reports intermittently taking her vitamin D supplements.  Vitamin D level from today is pending.   -Continue vitamin D to 2000 units daily. -She will be due for a mammogram of her right breast in January 2022. -Orders placed.  Disposition:  -RTC for telephone visit after mammogram to discuss results. -RTC in 6 months for follow-up with repeat labs (CBC, CMP, vitamin D level) and physical exam.  No problem-specific Assessment & Plan notes found for this encounter.   Greater than 50% was spent in counseling and coordination of care with this patient including but not limited to discussion of the relevant topics above (See A&P) including, but not limited to diagnosis and management of acute and chronic medical conditions.   Orders placed this encounter:  No orders of the defined types were placed in this encounter.  Faythe Casa, NP 01/13/2021 8:58 AM [place] [phone]

## 2021-01-14 ENCOUNTER — Encounter (HOSPITAL_COMMUNITY): Payer: Self-pay

## 2021-01-14 ENCOUNTER — Ambulatory Visit (HOSPITAL_COMMUNITY)
Admission: RE | Admit: 2021-01-14 | Discharge: 2021-01-14 | Disposition: A | Discharge status: Home / Self Care | Payer: 59 | Source: Ambulatory Visit | Attending: Oncology | Admitting: Oncology

## 2021-01-14 DIAGNOSIS — Z1231 Encounter for screening mammogram for malignant neoplasm of breast: Secondary | ICD-10-CM | POA: Diagnosis not present

## 2021-01-20 ENCOUNTER — Other Ambulatory Visit: Payer: Self-pay

## 2021-01-20 ENCOUNTER — Inpatient Hospital Stay (HOSPITAL_COMMUNITY): Payer: 59 | Attending: Hematology and Oncology | Admitting: Oncology

## 2021-01-20 DIAGNOSIS — D0512 Intraductal carcinoma in situ of left breast: Secondary | ICD-10-CM | POA: Diagnosis not present

## 2021-01-20 NOTE — Progress Notes (Signed)
[address], [place] [address]   CLINIC:  Medical Oncology/Hematology  PCP:  Rosita Fire, MD [place] / [address] [place] [address] [phone]   REASON FOR VISIT:  Follow-up for left breast high-grade DCIS.  CURRENT THERAPY: Continue hormone blocker therapy, anastrozole  BRIEF ONCOLOGIC HISTORY:  Oncology History  Invasive ductal carcinoma of left breast ([facility] [facility])  09/24/2013 Initial Diagnosis   Invasive ductal carcinoma of left breast   11/12/2013 Surgery   Left breast lumpectomy by Dr. Arnoldo Morale- High grade invasive ductal carcinoma less than 0.1 cm from posterior and medial margins, 0/1 lymph nodes, triple negative   12/31/2013 - 04/20/2014 Chemotherapy   TAC every 21 days.  Adriamycin dropped following cycle 3 for a decreased EF compared to baseline.   05/12/2014 - 07/03/2014 Radiation Therapy   XRT     CANCER STAGING: Cancer Staging Invasive ductal carcinoma of left breast ([place]) Staging form: Breast, AJCC 7th Edition - Clinical: Stage IA (T1c, N0, cM0) - Signed by Baird Cancer, PA-C on 01/06/2014  I connected with Sally [name] on 01/20/21 at  9:30 AM EST by telephone visit and verified that I am speaking with the correct person using two identifiers.   I discussed the limitations, risks, security and privacy concerns of performing an evaluation and management service by telemedicine and the availability of in-person appointments. I also discussed with the patient that there may be a patient responsible charge related to this service. The patient expressed understanding and agreed to proceed.   Other persons participating in the visit and their role in the encounter: None  Patient's location: Home Provider's location: Clinic   INTERVAL HISTORY:  Sally [name], a 65 y.o. female, returns for routine follow-up for her left breast high-grade DCIS. Sally [name] was last seen on 12/02/2020.  Sally [name] has been doing well since  her last visit.  Sally [name] is here for results of her mammogram.  Sally [name] is now regularly taking her calcium and vitamin D supplements. Sally [name] denies any pain.  Her appetite is 100% energy levels are 75%.  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  Sally [name] denies any new concerns with her breasts.  Sally [name] has occasional pain near her scars.  Sally [name] began taking anastrozole as prescribed.   REVIEW OF SYSTEMS:  Review of Systems  Constitutional: Positive for fatigue. Negative for appetite change, chills, diaphoresis and fever.  HENT:   Negative for mouth sores, sore throat and trouble swallowing.   Eyes: Negative for eye problems.  Respiratory: Negative for cough, shortness of breath and wheezing.   Cardiovascular: Negative for chest pain, leg swelling and palpitations.  Gastrointestinal: Negative for abdominal pain, constipation, diarrhea, nausea and vomiting.  Genitourinary: Negative for bladder incontinence, dysuria and frequency.   Musculoskeletal: Negative for arthralgias, back pain and myalgias.  Skin: Negative for rash.  Neurological: Positive for numbness (At lumpectomy site). Negative for dizziness, extremity weakness and headaches.  Hematological: Does not bruise/bleed easily.  Psychiatric/Behavioral: Negative for depression and sleep disturbance. The patient is not nervous/anxious.     PAST MEDICAL/SURGICAL HISTORY:  Past Medical History:  Diagnosis Date  . Cancer ([facility]) 11/12/2013   left breast cancer  . Chemotherapy induced cardiomyopathy ([facility]) 03/28/2014   Adriamycin induced.  Outpatient MUGA scan performed on 02/25/2014 showed a drop in ejection fraction from 72% to 59% and for that reason doxorubicin was discontinued from her chemotherapy regimen starting on cycle 4.   . Chronic sinusitis   .  Diabetes mellitus without complication (HCC)    diet controlled  . Hives 05/2014  . Hypertension   . Invasive ductal carcinoma of left breast ([facility]) 12/11/2013  . Personal history of chemotherapy   . Personal  history of radiation therapy   . Sarcoidosis    diagnosed in the 1990s. doesn't recall treatment  . Tobacco abuse 10/21/2014   Past Surgical History:  Procedure Laterality Date  . ABDOMINAL HYSTERECTOMY    . COLONOSCOPY  01/09/2012   Dr. Oneida Alar: diverticulosis, hyperplastic polyps, hemorrhoids. next TCS in 10 years.   Marland Kitchen DILATION AND CURETTAGE OF UTERUS    . ECTOPIC PREGNANCY SURGERY    . GANGLION CYST EXCISION    . INGUINAL HERNIA REPAIR Left   . left leg surgery     17 yrs of age  . MASTECTOMY MODIFIED RADICAL Left 02/23/2020   Procedure: MASTECTOMY MODIFIED RADICAL;  Surgeon: Aviva Signs, MD;  Location: [facility];  Service: General;  Laterality: Left;  . PARTIAL MASTECTOMY WITH NEEDLE LOCALIZATION AND AXILLARY SENTINEL LYMPH NODE BX Left 11/12/2013   Procedure: PARTIAL MASTECTOMY WITH NEEDLE LOCALIZATION AND AXILLARY SENTINEL LYMPH NODE BX;  Surgeon: Jamesetta So, MD;  Location: [facility];  Service: General;  Laterality: Left;  . PORT-A-CATH REMOVAL Right 12/20/2016   Procedure: MINOR REMOVAL PORT-A-CATH;  Surgeon: Aviva Signs, MD;  Location: [facility];  Service: General;  Laterality: Right;  . PORTACATH PLACEMENT Right 12/29/2013   Procedure: INSERTION PORT-A-CATH;  Surgeon: Jamesetta So, MD;  Location: [facility];  Service: General;  Laterality: Right;    SOCIAL HISTORY:  Social History   Socioeconomic History  . Marital status: Divorced    Spouse name: Not on file  . Number of children: Not on file  . Years of education: Not on file  . Highest education level: Not on file  Occupational History  . Not on file  Tobacco Use  . Smoking status: Current Every Day Smoker    Packs/day: 0.25    Types: Cigarettes  . Smokeless tobacco: Never Used  Substance and Sexual Activity  . Alcohol use: Yes    Comment: social once a week  . Drug use: No  . Sexual activity: Yes    Birth control/protection: Surgical  Other Topics Concern  . Not on file  Social History Narrative  . Not on file    Social Determinants of Health   Financial Resource Strain: Low Risk   . Difficulty of Paying Living Expenses: Not hard at all  Food Insecurity: No Food Insecurity  . Worried About Charity fundraiser in the Last Year: Never true  . Ran Out of Food in the Last Year: Never true  Transportation Needs: No Transportation Needs  . Lack of Transportation (Medical): No  . Lack of Transportation (Non-Medical): No  Physical Activity: Insufficiently Active  . Days of Exercise per Week: 2 days  . Minutes of Exercise per Session: 30 min  Stress: No Stress Concern Present  . Feeling of Stress : Not at all  Social Connections: Moderately Integrated  . Frequency of Communication with Friends and Family: More than three times a week  . Frequency of Social Gatherings with Friends and Family: More than three times a week  . Attends Religious Services: 1 to 4 times per year  . Active Member of Clubs or Organizations: No  . Attends Archivist Meetings: 1 to 4 times per year  . Marital Status: Divorced  Human resources officer Violence: Not At Risk  .  Fear of Current or Ex-Partner: No  . Emotionally Abused: No  . Physically Abused: No  . Sexually Abused: No    FAMILY HISTORY:  Family History  Problem Relation Age of Onset  . Diabetes type II Mother   . Diabetes type II Father   . Prostate cancer Father   . Colon cancer Neg Hx   . Liver cancer Neg Hx     CURRENT MEDICATIONS:  Current Outpatient Medications  Medication Sig Dispense Refill  . amLODipine (NORVASC) 10 MG tablet Take 10 mg by mouth daily.     Marland Kitchen anastrozole (ARIMIDEX) 1 MG tablet TAKE 1 TABLET BY MOUTH  DAILY 60 tablet 5  . atorvastatin (LIPITOR) 40 MG tablet Take 40 mg by mouth daily.    . cetirizine (ZYRTEC) 10 MG tablet Take 10 mg by mouth daily.    Marland Kitchen HYDROcodone-acetaminophen (NORCO) 5-325 MG tablet Take 1 tablet by mouth every 4 (four) hours as needed for moderate pain. 30 tablet 0  . lisinopril-hydrochlorothiazide  (PRINZIDE,ZESTORETIC) 20-12.5 MG tablet Take 1 tablet by mouth daily.     . metFORMIN (GLUCOPHAGE) 850 MG tablet Take 850 mg by mouth daily with supper.      No current facility-administered medications for this visit.    ALLERGIES:  Allergies  Allergen Reactions  . Lemon Flavor     Peels tongue  . Other Itching and Swelling    Beer  . Shrimp [Shellfish Allergy]     Feels like a stick is in her throat    PHYSICAL EXAM:  Performance status (ECOG): 0 - Asymptomatic  There were no vitals filed for this visit. Wt Readings from Last 3 Encounters:  12/02/20 148 lb 3.2 oz (67.2 kg)  05/11/20 146 lb 9.6 oz (66.5 kg)  04/08/20 147 lb (66.7 kg)   Physical Exam Constitutional:      General: Sally [name] is not in acute distress.    Appearance: Normal appearance. Sally [name] is normal weight. Sally [name] is not ill-appearing.  HENT:     Mouth/Throat:     Mouth: Mucous membranes are moist.     Pharynx: No oropharyngeal exudate or posterior oropharyngeal erythema.  Eyes:     Extraocular Movements: Extraocular movements intact.     Pupils: Pupils are equal, round, and reactive to light.  Cardiovascular:     Rate and Rhythm: Normal rate and regular rhythm.     Pulses: Normal pulses.     Heart sounds: Normal heart sounds. No murmur heard. No friction rub. No gallop.   Pulmonary:     Effort: Pulmonary effort is normal.     Breath sounds: Normal breath sounds. No wheezing, rhonchi or rales.  Abdominal:     Palpations: There is no mass.     Tenderness: There is no abdominal tenderness. There is no guarding.  Musculoskeletal:        General: No swelling or tenderness.     Right lower leg: No edema.     Left lower leg: No edema.  Skin:    Findings: No bruising or erythema.  Neurological:     Mental Status: Sally [name] is alert and oriented to person, place, and time.     Sensory: No sensory deficit.  Psychiatric:        Mood and Affect: Mood normal.        Behavior: Behavior normal.        Thought Content:  Thought content normal.        Judgment: Judgment normal.   Left mastectomy  site is within normal limits.  Right breast has no palpable masses.  LABORATORY DATA:  I have reviewed the labs as listed.  CBC Latest Ref Rng & Units 12/02/2020 05/04/2020 02/24/2020  WBC 4.0 - 10.5 K/uL 7.2 5.3 11.5(H)  Hemoglobin 12.0 - 15.0 g/dL 13.1 13.4 12.4  Hematocrit 36.0 - 46.0 % 39.6 40.8 36.9  Platelets 150 - 400 K/uL 209 214 199   CMP Latest Ref Rng & Units 12/02/2020 05/04/2020 02/24/2020  Glucose 70 - 99 mg/dL 104(H) 114(H) 153(H)  BUN 8 - 23 mg/dL 33(H) 20 23  Creatinine 0.44 - 1.00 mg/dL 1.10(H) 0.99 1.00  Sodium 135 - 145 mmol/L 137 137 136  Potassium 3.5 - 5.1 mmol/L 3.7 3.6 4.0  Chloride 98 - 111 mmol/L 99 101 103  CO2 22 - 32 mmol/L 28 26 25   Calcium 8.9 - 10.3 mg/dL 9.9 9.7 8.7(L)  Total Protein 6.5 - 8.1 g/dL 7.7 7.9 -  Total Bilirubin 0.3 - 1.2 mg/dL 0.7 0.8 -  Alkaline Phos 38 - 126 U/L 149(H) 182(H) -  AST 15 - 41 U/L 21 26 -  ALT 0 - 44 U/L 24 29 -    DIAGNOSTIC IMAGING:  I have independently reviewed the scans and discussed with the patient.  ASSESSMENT & PLAN:  1. High-grade left breast DCIS, ER/PR 2% positive: -Left mastectomy on 02/23/2020 with pathology showing 0.5 cm high-grade DCIS with central necrosis.  Margins negative.  ER/PR 2% positive.  pTis PMX. -Anastrozole started on 03/11/2020. -Sally [name] denies any worsening of hot flashes.  No musculoskeletal symptoms.   -DEXA scan dated 03/22/2020 which shows T score 0.0. -Vitamin D was low at 18.8 during her last visit.  Reports intermittently taking her vitamin D supplements.  Vitamin D level from 12/02/20 improved to 34.03. -Continue vitamin D to 2000 units daily. -Mammogram from 01/14/21 shows BI-RADS category 1-Negative. -Repeat screening mammogram in 1 year.  Disposition:  -RTC in 6 months for follow-up with repeat labs (CBC, CMP, vitamin D level) and physical exam.  No problem-specific Assessment & Plan notes found for this  encounter.  I provided 15 minutes of non face-to-face telephone visit time during this encounter, and > 50% was spent counseling as documented under my assessment & plan.  Orders placed this encounter:  No orders of the defined types were placed in this encounter.  Faythe Casa, NP 01/20/2021 9:36 AM [place] [phone]

## 2021-03-19 ENCOUNTER — Other Ambulatory Visit (HOSPITAL_COMMUNITY): Payer: Self-pay | Admitting: Hematology

## 2021-03-19 DIAGNOSIS — D0512 Intraductal carcinoma in situ of left breast: Secondary | ICD-10-CM

## 2021-05-13 IMAGING — MG DIGITAL DIAGNOSTIC BILAT W/ TOMO W/ CAD
8 of 12 series · 8 of 32 positions shown · non-contrast
Comparison: Previous exam(s).

CLINICAL DATA: 63-year-old patient presents for diagnostic
mammogram. She has a history of left breast cancer status has a
personal of therapy and chemotherapy. She is asymptomatic.

EXAM:
DIGITAL DIAGNOSTIC BILATERAL MAMMOGRAM WITH CAD AND TOMO

[L MLO]
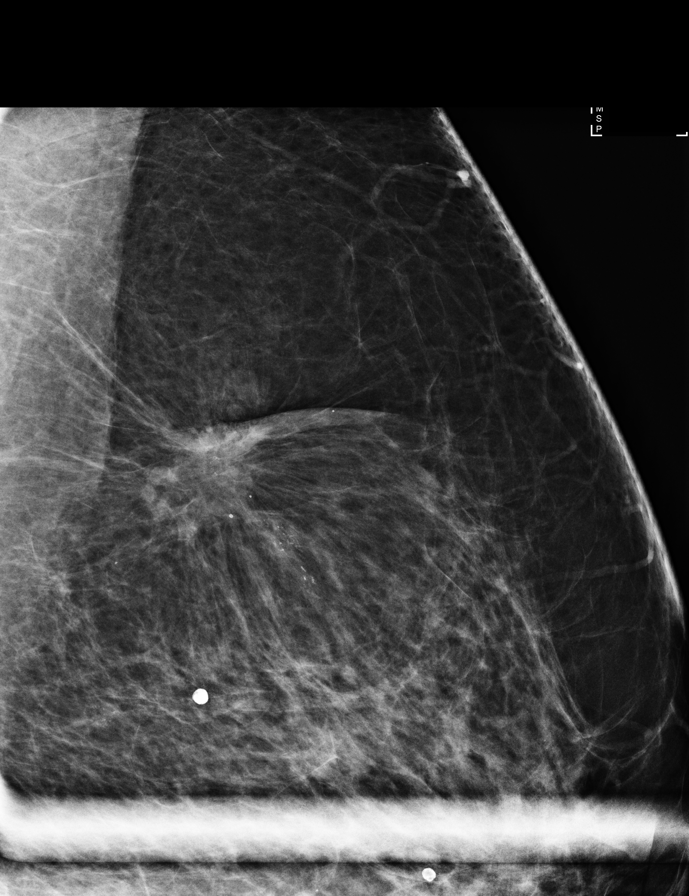

[L CC]
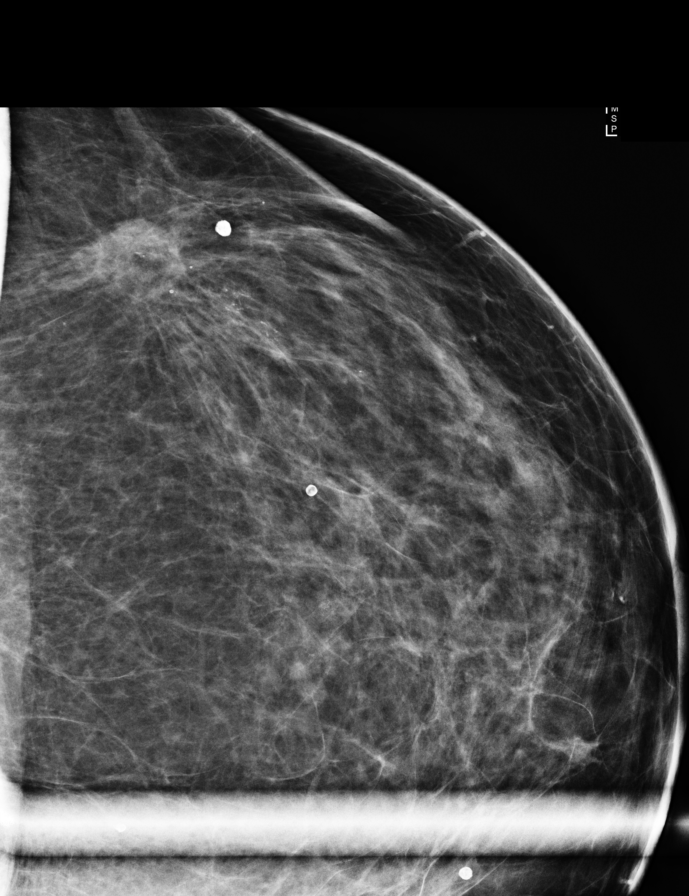

[R MLO synth-2D]
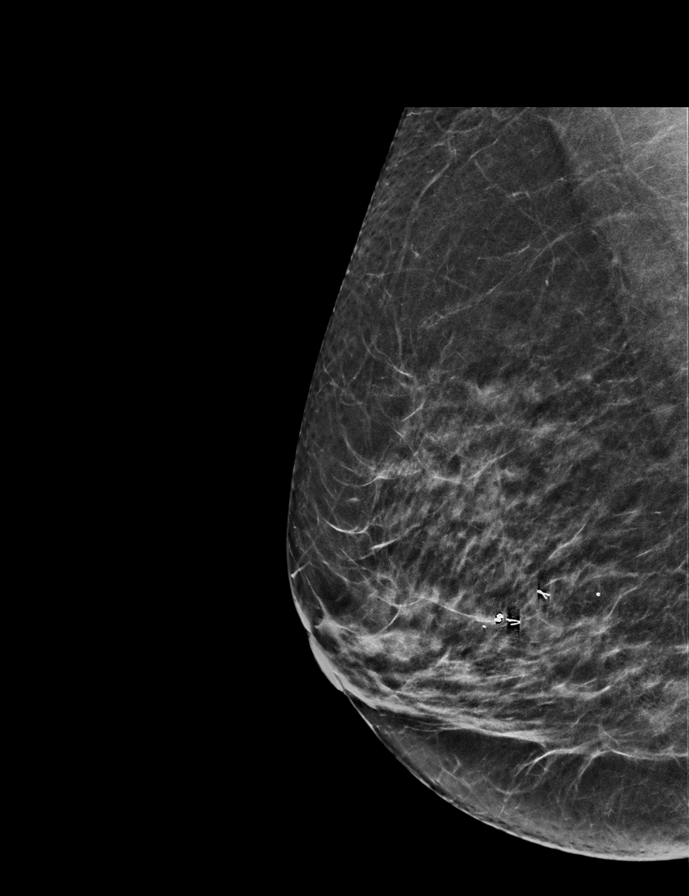

[L ML synth-2D]
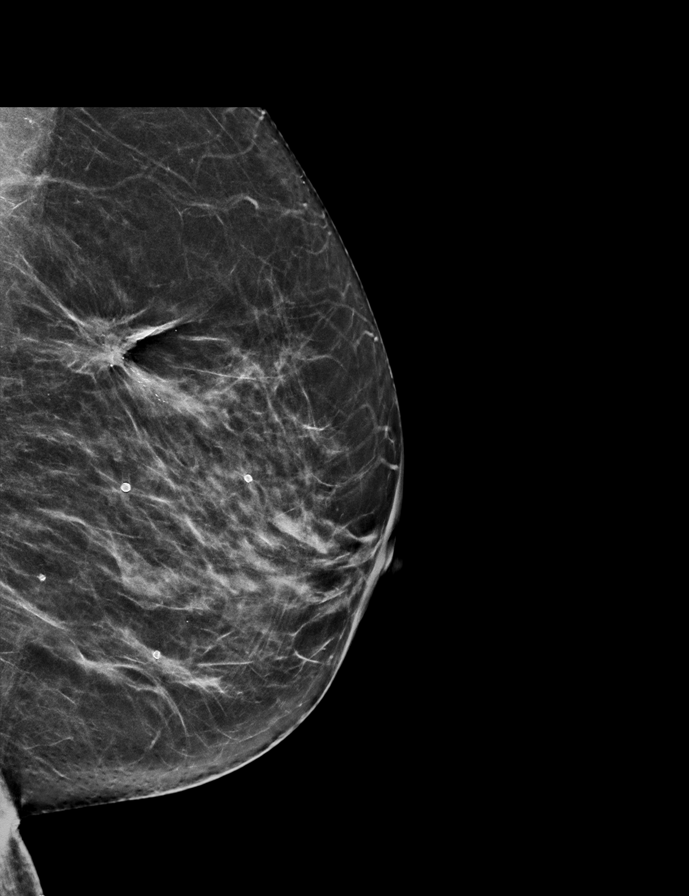

[R CC synth-2D]
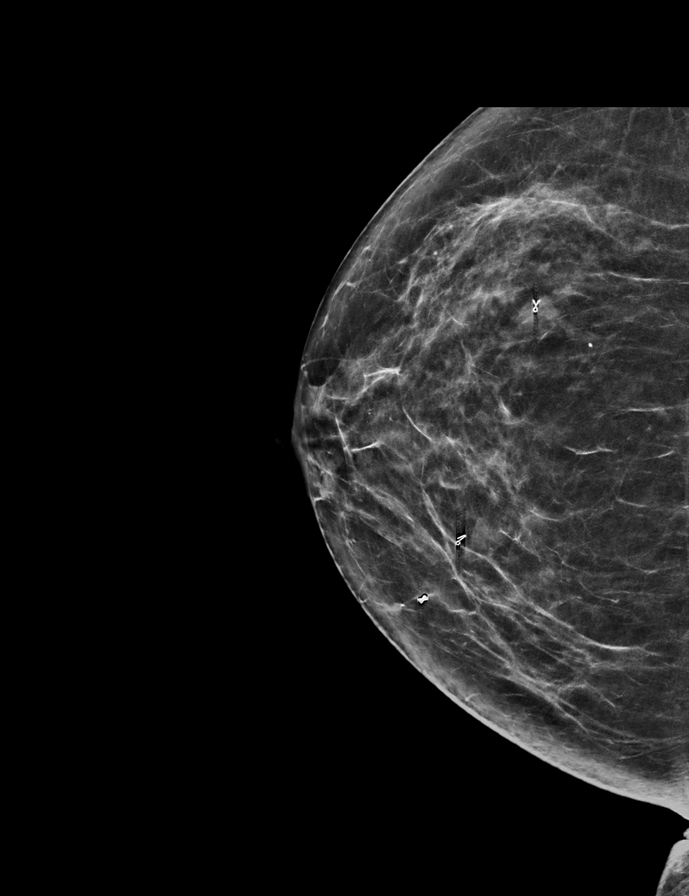

[L CC synth-2D]
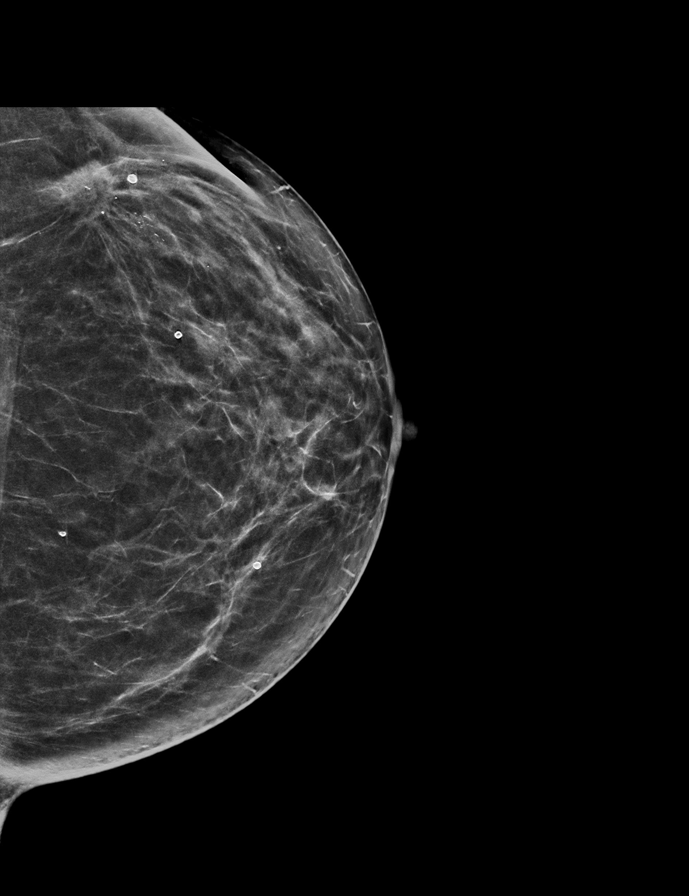

[L MLO synth-2D]
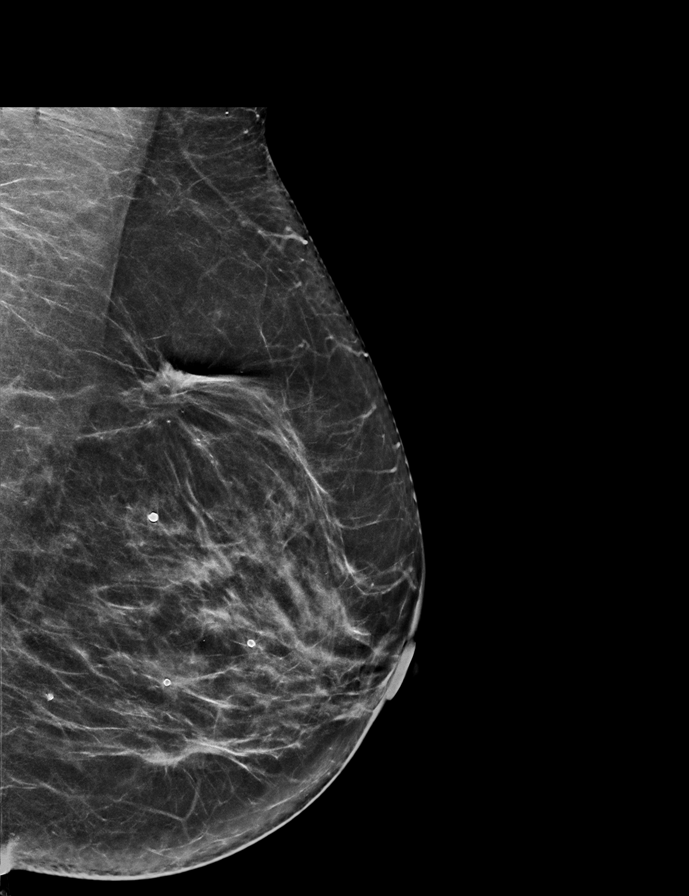

[L ML tomo · tomo slice 31/62.0]
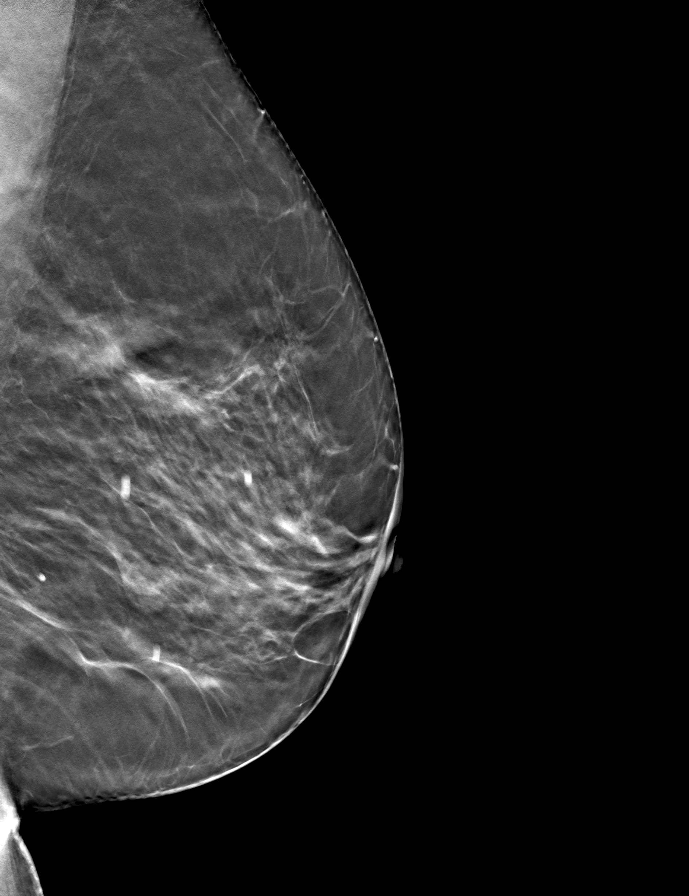

[8 of 32 positions shown; findings below may reference images not displayed]

ACR Breast Density Category b: There are scattered areas of
fibroglandular density.
FINDINGS: Lumpectomy changes in the upper-outer quadrant of the breast,
posterior third. There are new linearly-oriented heterogeneous
calcifications immediately anterior to and extending inferiorly from
the lumpectomy site. These calcifications span approximately 1.1 x
1.1 x 0.7 cm. There is no associated mass. The remainder of the left
breast is negative. There are postoperative changes in the left
axilla.

Two biopsy clips are present in the right breast. No mass,
suspicious microcalcification, or distortion is identified in the
right breast to suggest malignancy.

Mammographic images were processed with CAD.
IMPRESSION: Heterogeneous linearly oriented calcifications immediately anterior
to the lumpectomy site in the upper-outer quadrant of the left
breast. Ductal carcinoma in situ cannot be excluded.

No evidence of malignancy in the right breast.

RECOMMENDATION:
Stereotactic biopsy of the left breast calcifications is recommended
at the [REDACTED].

I have discussed the findings and recommendations with the patient.
If applicable, a reminder letter will be sent to the patient
regarding the next appointment.

BI-RADS CATEGORY  4: Suspicious.

## 2021-05-27 ENCOUNTER — Other Ambulatory Visit (HOSPITAL_COMMUNITY): Payer: Self-pay | Admitting: *Deleted

## 2021-05-27 DIAGNOSIS — Z780 Asymptomatic menopausal state: Secondary | ICD-10-CM

## 2021-05-27 DIAGNOSIS — D0512 Intraductal carcinoma in situ of left breast: Secondary | ICD-10-CM

## 2021-05-30 ENCOUNTER — Inpatient Hospital Stay (HOSPITAL_COMMUNITY): Payer: 59

## 2021-06-06 ENCOUNTER — Ambulatory Visit (HOSPITAL_COMMUNITY): Payer: 59 | Admitting: Hematology

## 2021-07-01 ENCOUNTER — Other Ambulatory Visit: Payer: Self-pay

## 2021-07-01 ENCOUNTER — Inpatient Hospital Stay (HOSPITAL_COMMUNITY): Payer: 59 | Attending: Hematology

## 2021-07-01 DIAGNOSIS — D0512 Intraductal carcinoma in situ of left breast: Secondary | ICD-10-CM | POA: Insufficient documentation

## 2021-07-01 DIAGNOSIS — Z780 Asymptomatic menopausal state: Secondary | ICD-10-CM

## 2021-07-01 DIAGNOSIS — E559 Vitamin D deficiency, unspecified: Secondary | ICD-10-CM | POA: Insufficient documentation

## 2021-07-01 LAB — CBC WITH DIFFERENTIAL/PLATELET
Abs Immature Granulocytes: 0.02 10*3/uL (ref 0.00–0.07)
Basophils Absolute: 0.1 10*3/uL (ref 0.0–0.1)
Basophils Relative: 1 %
Eosinophils Absolute: 0.2 10*3/uL (ref 0.0–0.5)
Eosinophils Relative: 2 %
HCT: 42.2 % (ref 36.0–46.0)
Hemoglobin: 13.9 g/dL (ref 12.0–15.0)
Immature Granulocytes: 0 %
Lymphocytes Relative: 24 %
Lymphs Abs: 2.2 10*3/uL (ref 0.7–4.0)
MCH: 31.2 pg (ref 26.0–34.0)
MCHC: 32.9 g/dL (ref 30.0–36.0)
MCV: 94.8 fL (ref 80.0–100.0)
Monocytes Absolute: 0.8 10*3/uL (ref 0.1–1.0)
Monocytes Relative: 8 %
Neutro Abs: 5.9 10*3/uL (ref 1.7–7.7)
Neutrophils Relative %: 65 %
Platelets: 214 10*3/uL (ref 150–400)
RBC: 4.45 MIL/uL (ref 3.87–5.11)
RDW: 13.1 % (ref 11.5–15.5)
WBC: 9.2 10*3/uL (ref 4.0–10.5)
nRBC: 0 % (ref 0.0–0.2)

## 2021-07-01 LAB — COMPREHENSIVE METABOLIC PANEL
ALT: 23 U/L (ref 0–44)
AST: 19 U/L (ref 15–41)
Albumin: 4.6 g/dL (ref 3.5–5.0)
Alkaline Phosphatase: 167 U/L — ABNORMAL HIGH (ref 38–126)
Anion gap: 9 (ref 5–15)
BUN: 24 mg/dL — ABNORMAL HIGH (ref 8–23)
CO2: 29 mmol/L (ref 22–32)
Calcium: 9.6 mg/dL (ref 8.9–10.3)
Chloride: 100 mmol/L (ref 98–111)
Creatinine, Ser: 1.02 mg/dL — ABNORMAL HIGH (ref 0.44–1.00)
GFR, Estimated: >60 mL/min (ref >60)
Glucose, Bld: 101 mg/dL — ABNORMAL HIGH (ref 70–99)
Potassium: 3.9 mmol/L (ref 3.5–5.1)
Sodium: 138 mmol/L (ref 135–145)
Total Bilirubin: 0.5 mg/dL (ref 0.3–1.2)
Total Protein: 8 g/dL (ref 6.5–8.1)

## 2021-07-01 LAB — VITAMIN D 25 HYDROXY (VIT D DEFICIENCY, FRACTURES): Vit D, 25-Hydroxy: 40.35 ng/mL (ref 30–100)

## 2021-07-07 ENCOUNTER — Inpatient Hospital Stay (HOSPITAL_BASED_OUTPATIENT_CLINIC_OR_DEPARTMENT_OTHER): Payer: 59 | Admitting: Physician Assistant

## 2021-07-07 ENCOUNTER — Other Ambulatory Visit: Payer: Self-pay

## 2021-07-07 VITALS — BP 128/96 | HR 77 | Resp 16 | Wt 144.6 lb

## 2021-07-07 DIAGNOSIS — D0512 Intraductal carcinoma in situ of left breast: Secondary | ICD-10-CM

## 2021-07-07 NOTE — Progress Notes (Signed)
[facility] [address] [place] [address]   VIRTUAL VISIT  CLINIC:  Medical Oncology/Hematology  PCP:  Rosita Fire, MD [facility] / [place] [id number] [phone]   REASON FOR VISIT:  Follow-up for left breast high-grade DCIS.  CURRENT THERAPY: Continue hormone blocker therapy, anastrozole  BRIEF ONCOLOGIC HISTORY:  Oncology History  Invasive ductal carcinoma of left breast ([facility])  09/24/2013 Initial Diagnosis   Invasive ductal carcinoma of left breast    11/12/2013 Surgery   Left breast lumpectomy by Dr. Arnoldo Morale- High grade invasive ductal carcinoma less than 0.1 cm from posterior and medial margins, 0/1 lymph nodes, triple negative    12/31/2013 - 04/20/2014 Chemotherapy   TAC every 21 days.  Adriamycin dropped following cycle 3 for a decreased EF compared to baseline.    05/12/2014 - 07/03/2014 Radiation Therapy   XRT      CANCER STAGING: Cancer Staging Invasive ductal carcinoma of left breast (HCC) Staging form: Breast, AJCC 7th Edition - Clinical: Stage IA (T1c, N0, cM0) - Signed by Baird Cancer, PA-C on 01/06/2014   INTERVAL HISTORY:  [name], a 65 y.o. female, returns for routine follow-up for her left breast high-grade DCIS. Sally [name] was last seen on 01/20/2021. She is unaccompanied for this visit. She reports that her energy levels are stable. She has intermittent episodes of fatigue but she continues to complete her ADLs on her own. She has a good appetite without any noticeable weight changes. She denies any nausea, vomiting or abdominal pain. She has regular bowel movements without any diarrhea or constipation. Patient denies any fevers, chills, night sweats, shortness of breath, chest pain or cough. She denies any new concerns with her breasts.   She continues to take anastrozole as prescribed.   REVIEW OF SYSTEMS:  Review of Systems  Constitutional:  Positive for fatigue. Negative for appetite  change, chills, diaphoresis and fever.  HENT:   Negative for mouth sores, sore throat and trouble swallowing.   Eyes:  Negative for eye problems.  Respiratory:  Negative for cough, shortness of breath and wheezing.   Cardiovascular:  Negative for chest pain, leg swelling and palpitations.  Gastrointestinal:  Negative for abdominal pain, constipation, diarrhea, nausea and vomiting.  Genitourinary:  Negative for bladder incontinence, dysuria and frequency.   Musculoskeletal:  Negative for arthralgias, back pain and myalgias.  Skin:  Negative for rash.  Neurological:  Positive for numbness (At lumpectomy site). Negative for dizziness, extremity weakness and headaches.  Hematological:  Does not bruise/bleed easily.  Psychiatric/Behavioral:  Negative for depression and sleep disturbance. The patient is not nervous/anxious.    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  Outpatient MUGA scan performed on 02/25/2014 showed a drop in ejection fraction from 72% to 59% and for that reason doxorubicin was discontinued from her chemotherapy regimen starting on cycle 4.    Chronic sinusitis    Diabetes mellitus without complication ([facility])    diet controlled   Hives 05/2014   Hypertension    Invasive ductal carcinoma of left breast ([facility]) 12/11/2013   Personal history of chemotherapy    Personal history of radiation therapy    Sarcoidosis    diagnosed in the 1990s. doesn't recall treatment   Tobacco abuse 10/21/2014   Past Surgical History:  Procedure Laterality Date   ABDOMINAL HYSTERECTOMY  COLONOSCOPY  01/09/2012   Dr. Oneida Alar: diverticulosis, hyperplastic polyps, hemorrhoids. next TCS in 10 years.    DILATION AND CURETTAGE OF UTERUS     ECTOPIC PREGNANCY SURGERY     GANGLION CYST EXCISION     INGUINAL HERNIA REPAIR Left    left leg surgery      17 yrs of age   58 MODIFIED RADICAL Left 02/23/2020   Procedure: MASTECTOMY MODIFIED RADICAL;  Surgeon: Aviva Signs, MD;  Location: [facility];  Service: General;  Laterality: Left;   PARTIAL MASTECTOMY WITH NEEDLE LOCALIZATION AND AXILLARY SENTINEL LYMPH NODE BX Left 11/12/2013   Procedure: PARTIAL MASTECTOMY WITH NEEDLE LOCALIZATION AND AXILLARY SENTINEL LYMPH NODE BX;  Surgeon: Jamesetta So, MD;  Location: [facility];  Service: General;  Laterality: Left;   PORT-A-CATH REMOVAL Right 12/20/2016   Procedure: MINOR REMOVAL PORT-A-CATH;  Surgeon: Aviva Signs, MD;  Location: [facility];  Service: General;  Laterality: Right;   PORTACATH PLACEMENT Right 12/29/2013   Procedure: INSERTION PORT-A-CATH;  Surgeon: Jamesetta So, MD;  Location: [facility];  Service: General;  Laterality: Right;    SOCIAL HISTORY:  Social History   Socioeconomic History   Marital status: Divorced    Spouse name: Not on file   Number of children: Not on file   Years of education: Not on file   Highest education level: Not on file  Occupational History   Not on file  Tobacco Use   Smoking status: Every Day    Packs/day: 0.25    Types: Cigarettes   Smokeless tobacco: Never  Substance and Sexual Activity   Alcohol use: Yes    Comment: social once a week   Drug use: No   Sexual activity: Yes    Birth control/protection: Surgical  Other Topics Concern   Not on file  Social History Narrative   Not on file   Social Determinants of Health   Financial Resource Strain: Low Risk    Difficulty of Paying Living Expenses: Not hard at all  Food Insecurity: No Food Insecurity   Worried About Charity fundraiser in the Last Year: Never true   Arboriculturist in the Last Year: Never true  Transportation Needs: No Transportation Needs   Lack of Transportation (Medical): No   Lack of Transportation (Non-Medical): No  Physical Activity: Insufficiently Active   Days of Exercise per Week: 2 days   Minutes of Exercise per  Session: 30 min  Stress: No Stress Concern Present   Feeling of Stress : Not at all  Social Connections: Moderately Integrated   Frequency of Communication with Friends and Family: More than three times a week   Frequency of Social Gatherings with Friends and Family: More than three times a week   Attends Religious Services: 1 to 4 times per year   Active Member of [employer] or Organizations: No   Attends Music therapist: 1 to 4 times per year   Marital Status: Divorced  Human resources officer Violence: Not At Risk   Fear of Current or Ex-Partner: No   Emotionally Abused: No   Physically Abused: No   Sexually Abused: No    FAMILY HISTORY:  Family History  Problem Relation Age of Onset   Diabetes type II Mother    Diabetes type II Father    Prostate cancer Father    Colon cancer Neg Hx    Liver cancer Neg Hx     CURRENT MEDICATIONS:  Current Outpatient Medications  Medication  Sig Dispense Refill   amLODipine (NORVASC) 10 MG tablet Take 10 mg by mouth daily.      anastrozole (ARIMIDEX) 1 MG tablet TAKE 1 TABLET BY MOUTH  DAILY 90 tablet 3   atorvastatin (LIPITOR) 40 MG tablet Take 40 mg by mouth daily.     cetirizine (ZYRTEC) 10 MG tablet Take 10 mg by mouth daily.     HYDROcodone-acetaminophen (NORCO) 5-325 MG tablet Take 1 tablet by mouth every 4 (four) hours as needed for moderate pain. 30 tablet 0   lisinopril-hydrochlorothiazide (PRINZIDE,ZESTORETIC) 20-12.5 MG tablet Take 1 tablet by mouth daily.      metFORMIN (GLUCOPHAGE) 850 MG tablet Take 850 mg by mouth daily with supper.      fluorometholone (FML) 0.1 % ophthalmic suspension SMARTSIG:In Eye(s) (Patient not taking: Reported on 07/07/2021)     No current facility-administered medications for this visit.    ALLERGIES:  Allergies  Allergen Reactions   Lemon Flavor     Peels tongue   Other Itching and Swelling    Beer   Shrimp [Shellfish Allergy]     Feels like a stick is in her throat   Physical exam not  performed due to virtual visit.   Vitals:   07/07/21 1214  BP: (!) 128/96  Pulse: 77  Resp: 16  SpO2: 100%   Wt Readings from Last 3 Encounters:  07/07/21 144 lb 10 oz (65.6 kg)  12/02/20 148 lb 3.2 oz (67.2 kg)  05/11/20 146 lb 9.6 oz (66.5 kg)   LABORATORY DATA:  I have reviewed the labs as listed.  CBC Latest Ref Rng & Units 07/01/2021 12/02/2020 05/04/2020  WBC 4.0 - 10.5 K/uL 9.2 7.2 5.3  Hemoglobin 12.0 - 15.0 g/dL 13.9 13.1 13.4  Hematocrit 36.0 - 46.0 % 42.2 39.6 40.8  Platelets 150 - 400 K/uL 214 209 214   CMP Latest Ref Rng & Units 07/01/2021 12/02/2020 05/04/2020  Glucose 70 - 99 mg/dL 101(H) 104(H) 114(H)  BUN 8 - 23 mg/dL 24(H) 33(H) 20  Creatinine 0.44 - 1.00 mg/dL 1.02(H) 1.10(H) 0.99  Sodium 135 - 145 mmol/L 138 137 137  Potassium 3.5 - 5.1 mmol/L 3.9 3.7 3.6  Chloride 98 - 111 mmol/L 100 99 101  CO2 22 - 32 mmol/L 29 28 26   Calcium 8.9 - 10.3 mg/dL 9.6 9.9 9.7  Total Protein 6.5 - 8.1 g/dL 8.0 7.7 7.9  Total Bilirubin 0.3 - 1.2 mg/dL 0.5 0.7 0.8  Alkaline Phos 38 - 126 U/L 167(H) 149(H) 182(H)  AST 15 - 41 U/L 19 21 26   ALT 0 - 44 U/L 23 24 29     DIAGNOSTIC IMAGING:  No images were reviewed during this visit.   ASSESSMENT:  1.  High-grade left breast DCIS, ER/PR 2% positive: -Left mastectomy on 02/23/2020 with pathology showing 0.5 cm high-grade DCIS with central necrosis.  Margins negative.  ER/PR 2% positive.  pTis PMX. -Anastrozole started on 03/11/2020. -DEXA scan dated 03/22/2020 which shows T score 0.0.   2. Vitamin D Deficiency: -Currently on vitamin D 2000 units daily.  PLAN: 1.High-grade left breast DCIS, ER/PR 2% positive: -Labs from 07/01/2021 reviewed. No intervention required.  -Patient will continue on anastrozole as prescribed.  --RTC in 6 months for follow-up with repeat mammogram, labs (CBC, CMP, vitamin D level) and office visit.  2. Vitamin D Deficiency: --Vitamin D is within normal limits from 07/01/2021.  --Recommend to continue current  dose of vitamin D 2000 units daily.    Patient expressed understanding  and satisfaction with the plan provided.   I have spent a total of 25 minutes minutes of non-face-to-face time, preparing to see the patient, obtaining and/or reviewing separately obtained history, counseling and educating the patient, ordering tests, documenting clinical information in the electronic health record and care coordination.   Lincoln Brigham PA-C Hematology and [place]

## 2021-07-07 NOTE — Patient Instructions (Addendum)
[facility] at [facility] Discharge Instructions  You were seen today by Irene Thayil, PA.   Please follow up as scheduled.   Thank you for choosing [facility] at [facility] to provide your oncology and hematology care.  To afford each patient quality time with our provider, please arrive at least 15 minutes before your scheduled appointment time.   If you have a lab appointment with the Cancer Center please come in thru the Main Entrance and check in at the main information desk.  You need to re-schedule your appointment should you arrive 10 or more minutes late.  We strive to give you quality time with our providers, and arriving late affects you and other patients whose appointments are after yours.  Also, if you no show three or more times for appointments you may be dismissed from the clinic at the providers discretion.     Again, thank you for choosing [facility].  Our hope is that these requests will decrease the amount of time that you wait before being seen by our physicians.       _____________________________________________________________  Should you have questions after your visit to [facility], please contact our office at [phone] and follow the prompts.  Our office hours are 8:00 a.m. and 4:30 p.m. Monday - Friday.  Please note that voicemails left after 4:00 p.m. may not be returned until the following business day.  We are closed weekends and major holidays.  You do have access to a nurse 24-7, just call the main number to the clinic [phone] and do not press any options, hold on the line and a nurse will answer the phone.    For prescription refill requests, have your pharmacy contact our office and allow 72 hours.    Due to Covid, you will need to wear a mask upon entering the hospital. If you do not have a mask, a mask will be given to you at the Main Entrance upon arrival. For doctor  visits, patients may have 1 support person age 18 or older with them. For treatment visits, patients can not have anyone with them due to social distancing guidelines and our immunocompromised population.     

## 2021-07-07 NOTE — Progress Notes (Signed)
Patient is taking the anastrozole as prescribed and denies any side effects.

## 2021-12-28 ENCOUNTER — Encounter: Payer: Self-pay | Admitting: *Deleted

## 2022-01-16 ENCOUNTER — Inpatient Hospital Stay (HOSPITAL_COMMUNITY): Payer: 59 | Attending: Hematology

## 2022-01-16 ENCOUNTER — Other Ambulatory Visit: Payer: Self-pay

## 2022-01-16 ENCOUNTER — Ambulatory Visit (HOSPITAL_COMMUNITY)
Admission: RE | Admit: 2022-01-16 | Discharge: 2022-01-16 | Disposition: A | Discharge status: Home / Self Care | Payer: 59 | Source: Ambulatory Visit | Attending: Physician Assistant | Admitting: Physician Assistant

## 2022-01-16 DIAGNOSIS — Z170 Estrogen receptor positive status [ER+]: Secondary | ICD-10-CM | POA: Insufficient documentation

## 2022-01-16 DIAGNOSIS — E559 Vitamin D deficiency, unspecified: Secondary | ICD-10-CM | POA: Diagnosis not present

## 2022-01-16 DIAGNOSIS — Z9221 Personal history of antineoplastic chemotherapy: Secondary | ICD-10-CM | POA: Insufficient documentation

## 2022-01-16 DIAGNOSIS — Z9012 Acquired absence of left breast and nipple: Secondary | ICD-10-CM | POA: Diagnosis not present

## 2022-01-16 DIAGNOSIS — Z923 Personal history of irradiation: Secondary | ICD-10-CM | POA: Insufficient documentation

## 2022-01-16 DIAGNOSIS — Z79811 Long term (current) use of aromatase inhibitors: Secondary | ICD-10-CM | POA: Insufficient documentation

## 2022-01-16 DIAGNOSIS — D0512 Intraductal carcinoma in situ of left breast: Secondary | ICD-10-CM | POA: Diagnosis not present

## 2022-01-16 DIAGNOSIS — K5900 Constipation, unspecified: Secondary | ICD-10-CM | POA: Diagnosis not present

## 2022-01-16 DIAGNOSIS — Z1231 Encounter for screening mammogram for malignant neoplasm of breast: Secondary | ICD-10-CM | POA: Insufficient documentation

## 2022-01-16 LAB — CBC WITH DIFFERENTIAL/PLATELET
Abs Immature Granulocytes: 0.02 10*3/uL (ref 0.00–0.07)
Basophils Absolute: 0.1 10*3/uL (ref 0.0–0.1)
Basophils Relative: 1 %
Eosinophils Absolute: 0.2 10*3/uL (ref 0.0–0.5)
Eosinophils Relative: 3 %
HCT: 41.7 % (ref 36.0–46.0)
Hemoglobin: 13.9 g/dL (ref 12.0–15.0)
Immature Granulocytes: 0 %
Lymphocytes Relative: 28 %
Lymphs Abs: 2.2 10*3/uL (ref 0.7–4.0)
MCH: 31 pg (ref 26.0–34.0)
MCHC: 33.3 g/dL (ref 30.0–36.0)
MCV: 92.9 fL (ref 80.0–100.0)
Monocytes Absolute: 0.7 10*3/uL (ref 0.1–1.0)
Monocytes Relative: 8 %
Neutro Abs: 4.8 10*3/uL (ref 1.7–7.7)
Neutrophils Relative %: 60 %
Platelets: 229 10*3/uL (ref 150–400)
RBC: 4.49 MIL/uL (ref 3.87–5.11)
RDW: 13.2 % (ref 11.5–15.5)
WBC: 7.9 10*3/uL (ref 4.0–10.5)
nRBC: 0 % (ref 0.0–0.2)

## 2022-01-16 LAB — COMPREHENSIVE METABOLIC PANEL
ALT: 25 U/L (ref 0–44)
AST: 22 U/L (ref 15–41)
Albumin: 4.4 g/dL (ref 3.5–5.0)
Alkaline Phosphatase: 179 U/L — ABNORMAL HIGH (ref 38–126)
Anion gap: 8 (ref 5–15)
BUN: 17 mg/dL (ref 8–23)
CO2: 27 mmol/L (ref 22–32)
Calcium: 9.4 mg/dL (ref 8.9–10.3)
Chloride: 100 mmol/L (ref 98–111)
Creatinine, Ser: 0.91 mg/dL (ref 0.44–1.00)
GFR, Estimated: >60 mL/min (ref >60)
Glucose, Bld: 125 mg/dL — ABNORMAL HIGH (ref 70–99)
Potassium: 3.4 mmol/L — ABNORMAL LOW (ref 3.5–5.1)
Sodium: 135 mmol/L (ref 135–145)
Total Bilirubin: 0.6 mg/dL (ref 0.3–1.2)
Total Protein: 7.8 g/dL (ref 6.5–8.1)

## 2022-01-16 LAB — VITAMIN D 25 HYDROXY (VIT D DEFICIENCY, FRACTURES): Vit D, 25-Hydroxy: 21.96 ng/mL — ABNORMAL LOW (ref 30–100)

## 2022-01-23 ENCOUNTER — Other Ambulatory Visit: Payer: Self-pay

## 2022-01-23 ENCOUNTER — Inpatient Hospital Stay (HOSPITAL_BASED_OUTPATIENT_CLINIC_OR_DEPARTMENT_OTHER): Payer: 59 | Admitting: Hematology

## 2022-01-23 VITALS — BP 128/84 | HR 94 | Temp 97.9°F | Resp 16 | Ht 61.0 in | Wt 147.3 lb

## 2022-01-23 DIAGNOSIS — D0512 Intraductal carcinoma in situ of left breast: Secondary | ICD-10-CM | POA: Diagnosis not present

## 2022-01-23 NOTE — Patient Instructions (Signed)
[facility] at [facility] Discharge Instructions   You were seen and examined today by Dr. Delton Coombes.  He reviewed your lab work which is normal/stable with the exception of your Vitamin D level.  It is low.  You should resume taking Vitamin D supplements daily.  Your mammogram done last week was normal.  Return as scheduled in 6 months for lab work and office visit.    Thank you for choosing [place] at [facility] to provide your oncology and hematology care.  To afford each patient quality time with our provider, please arrive at least 15 minutes before your scheduled appointment time.   If you have a lab appointment with the [facility] please come in thru the Main Entrance and check in at the main information desk.  You need to re-schedule your appointment should you arrive 10 or more minutes late.  We strive to give you quality time with our providers, and arriving late affects you and other patients whose appointments are after yours.  Also, if you no show three or more times for appointments you may be dismissed from the clinic at the providers discretion.     Again, thank you for choosing [facility].  Our hope is that these requests will decrease the amount of time that you wait before being seen by our physicians.       _____________________________________________________________  Should you have questions after your visit to [facility], please contact our office at [phone] and follow the prompts.  Our office hours are 8:00 a.m. and 4:30 p.m. Monday - Friday.  Please note that voicemails left after 4:00 p.m. may not be returned until the following business day.  We are closed weekends and major holidays.  You do have access to a nurse 24-7, just call the main number to the clinic [phone] and do not press any options, hold on the line and a nurse will answer the phone.    For prescription  refill requests, have your pharmacy contact our office and allow 72 hours.    Due to Covid, you will need to wear a mask upon entering the hospital. If you do not have a mask, a mask will be given to you at the Main Entrance upon arrival. For doctor visits, patients may have 1 support person age 64 or older with them. For treatment visits, patients can not have anyone with them due to social distancing guidelines and our immunocompromised population.

## 2022-01-23 NOTE — Progress Notes (Signed)
[facility] [address] [place] [address]   Patient Care Team: Carrolyn Meiers, MD as PCP - General (Internal Medicine) Aviva Signs, MD as Consulting Physician (General Surgery) Derek Jack, MD as Medical Oncologist (Medical Oncology)  SUMMARY OF ONCOLOGIC HISTORY: Oncology History  Invasive ductal carcinoma of left breast ([place])  09/24/2013 Initial Diagnosis   Invasive ductal carcinoma of left breast   11/12/2013 Surgery   Left breast lumpectomy by Dr. Arnoldo Morale- High grade invasive ductal carcinoma less than 0.1 cm from posterior and medial margins, 0/1 lymph nodes, triple negative   12/31/2013 - 04/20/2014 Chemotherapy   TAC every 21 days.  Adriamycin dropped following cycle 3 for a decreased EF compared to baseline.   05/12/2014 - 07/03/2014 Radiation Therapy   XRT     CHIEF COMPLIANT: Follow-up for left breast high-grade DCIS   INTERVAL HISTORY: [name] is a 66 y.o. female here today for follow up of her left breast high-grade DCIS. Her last visit was on 12/02/2020.   Today she reports feeling good. She denies aches, pains, and hot flashes. She stopped taking vitamin D and calcium 6 months ago.   REVIEW OF SYSTEMS:   Review of Systems  Constitutional:  Negative for appetite change and fatigue.  All other systems reviewed and are negative.  I have reviewed the past medical history, past surgical history, social history and family history with the patient and they are unchanged from previous note.   ALLERGIES:   is allergic to lemon flavor, other, and shrimp [shellfish allergy].   MEDICATIONS:  Current Outpatient Medications  Medication Sig Dispense Refill   amLODipine (NORVASC) 10 MG tablet Take 10 mg by mouth daily.      anastrozole (ARIMIDEX) 1 MG tablet TAKE 1 TABLET BY MOUTH  DAILY 90 tablet 3   atorvastatin (LIPITOR) 40 MG tablet Take 40 mg by mouth daily.     cetirizine (ZYRTEC) 10 MG tablet Take 10 mg by  mouth daily.     fluorometholone (FML) 0.1 % ophthalmic suspension      lisinopril-hydrochlorothiazide (PRINZIDE,ZESTORETIC) 20-12.5 MG tablet Take 1 tablet by mouth daily.      metFORMIN (GLUCOPHAGE) 850 MG tablet Take 850 mg by mouth daily with supper.      No current facility-administered medications for this visit.     PHYSICAL EXAMINATION: Performance status (ECOG): 0 - Asymptomatic  Vitals:   01/23/22 1121  BP: 128/84  Pulse: 94  Resp: 16  Temp: 97.9 F (36.6 C)  SpO2: 98%   Wt Readings from Last 3 Encounters:  01/23/22 147 lb 4.3 oz (66.8 kg)  07/07/21 144 lb 10 oz (65.6 kg)  12/02/20 148 lb 3.2 oz (67.2 kg)   Physical Exam Vitals reviewed.  Constitutional:      Appearance: Normal appearance.  Cardiovascular:     Rate and Rhythm: Normal rate and regular rhythm.     Pulses: Normal pulses.     Heart sounds: Normal heart sounds.  Pulmonary:     Effort: Pulmonary effort is normal.     Breath sounds: Normal breath sounds.  Chest:  Breasts:    Right: No swelling, bleeding, inverted nipple, mass, nipple discharge, skin change or tenderness.     Left: Absent. No swelling, bleeding, mass, skin change (mastetomy site within normal limits) or tenderness.  Abdominal:     Palpations: Abdomen is soft. There is no hepatomegaly, splenomegaly or mass.     Tenderness: There is no abdominal tenderness.  Lymphadenopathy:     Upper Body:     Right upper body: No supraclavicular, axillary or pectoral adenopathy.     Left upper body: No supraclavicular, axillary or pectoral adenopathy.  Neurological:     General: No focal deficit present.     Mental Status: She is alert and oriented to person, place, and time.  Psychiatric:        Mood and Affect: Mood normal.        Behavior: Behavior normal.    Breast Exam Chaperone: Thana Ates     LABORATORY DATA:  I have reviewed the data as listed CMP Latest Ref Rng & Units 01/16/2022 07/01/2021 12/02/2020  Glucose 70 - 99 mg/dL  125(H) 101(H) 104(H)  BUN 8 - 23 mg/dL 17 24(H) 33(H)  Creatinine 0.44 - 1.00 mg/dL 0.91 1.02(H) 1.10(H)  Sodium 135 - 145 mmol/L 135 138 137  Potassium 3.5 - 5.1 mmol/L 3.4(L) 3.9 3.7  Chloride 98 - 111 mmol/L 100 100 99  CO2 22 - 32 mmol/L 27 29 28   Calcium 8.9 - 10.3 mg/dL 9.4 9.6 9.9  Total Protein 6.5 - 8.1 g/dL 7.8 8.0 7.7  Total Bilirubin 0.3 - 1.2 mg/dL 0.6 0.5 0.7  Alkaline Phos 38 - 126 U/L 179(H) 167(H) 149(H)  AST 15 - 41 U/L 22 19 21   ALT 0 - 44 U/L 25 23 24    No results found for: URK270 Lab Results  Component Value Date   WBC 7.9 01/16/2022   HGB 13.9 01/16/2022   HCT 41.7 01/16/2022   MCV 92.9 01/16/2022   PLT 229 01/16/2022   NEUTROABS 4.8 01/16/2022    ASSESSMENT:  1.  High-grade left breast DCIS, ER/PR 2% positive: -Left mastectomy on 02/23/2020 with pathology showing 0.5 cm high-grade DCIS with central necrosis.  Margins negative.  ER/PR 2% positive.  pTis PMX. -Anastrozole started on 03/11/2020. -She denies any worsening of hot flashes.  No musculoskeletal symptoms.  She has developed some constipation since the start of anastrozole. -We reviewed her DEXA scan dated 03/22/2020 which shows T score 0.0. -Vitamin D is low at 18.8.  She is taking vitamin D 1000 units daily. -I have recommended increasing vitamin D to 2000 units daily. -We will see her back in 6 months for follow-up with repeat labs and physical exam.   2.  Left triple negative breast cancer: -Status post lumpectomy on 11/12/2013. -Status post 6 cycles of TAC from 12/31/2013 through 04/20/2014. -XRT from 05/02/2014 through 07/03/2014.   PLAN:  1.  High-grade left breast DCIS, ER/PR 2% positive: - Physical examination today did not reveal any palpable nodules at the left mastectomy site.  Right breast has no palpable masses. - Right breast mammogram on 01/16/2022 reviewed by me shows BI-RADS Category 1. - Labs reviewed by me shows normal LFTs and a persistently elevated alkaline phosphatase which is  stable.  CBC was normal. - Continue anastrozole daily which is being tolerated well. - RTC 6 months with repeat labs.  2.  Low vitamin D levels: - She has stopped taking calcium and vitamin D at last visit.  Vitamin D level today is 21. - She was told to restart taking calcium and vitamin D twice daily.  We will check levels at next visit.       Breast Cancer therapy associated bone loss: I have recommended calcium, Vitamin D and weight bearing exercises.  Orders placed this encounter:  No orders of the defined types were placed in this encounter.   The  patient has a good understanding of the overall plan. She agrees with it. She will call with any problems that may develop before the next visit here.  Derek Jack, MD [place] [phone]   I, Thana Ates, am acting as a scribe for Dr. Derek Jack.  I, Derek Jack MD, have reviewed the above documentation for accuracy and completeness, and I agree with the above.

## 2022-01-24 ENCOUNTER — Other Ambulatory Visit (HOSPITAL_COMMUNITY): Payer: Self-pay | Admitting: *Deleted

## 2022-01-24 DIAGNOSIS — D0512 Intraductal carcinoma in situ of left breast: Secondary | ICD-10-CM

## 2022-02-16 ENCOUNTER — Other Ambulatory Visit (HOSPITAL_COMMUNITY): Payer: Self-pay | Admitting: Hematology

## 2022-02-16 DIAGNOSIS — D0512 Intraductal carcinoma in situ of left breast: Secondary | ICD-10-CM

## 2022-07-17 ENCOUNTER — Inpatient Hospital Stay (HOSPITAL_COMMUNITY): Payer: 59

## 2022-07-19 ENCOUNTER — Inpatient Hospital Stay (HOSPITAL_COMMUNITY): Payer: 59 | Attending: Hematology

## 2022-07-19 DIAGNOSIS — Z79811 Long term (current) use of aromatase inhibitors: Secondary | ICD-10-CM | POA: Diagnosis not present

## 2022-07-19 DIAGNOSIS — Z170 Estrogen receptor positive status [ER+]: Secondary | ICD-10-CM | POA: Insufficient documentation

## 2022-07-19 DIAGNOSIS — D0512 Intraductal carcinoma in situ of left breast: Secondary | ICD-10-CM | POA: Diagnosis present

## 2022-07-19 DIAGNOSIS — E559 Vitamin D deficiency, unspecified: Secondary | ICD-10-CM | POA: Insufficient documentation

## 2022-07-19 DIAGNOSIS — Z9012 Acquired absence of left breast and nipple: Secondary | ICD-10-CM | POA: Diagnosis not present

## 2022-07-19 LAB — CBC WITH DIFFERENTIAL/PLATELET
Abs Immature Granulocytes: 0.02 10*3/uL (ref 0.00–0.07)
Basophils Absolute: 0.1 10*3/uL (ref 0.0–0.1)
Basophils Relative: 1 %
Eosinophils Absolute: 0.3 10*3/uL (ref 0.0–0.5)
Eosinophils Relative: 4 %
HCT: 38.6 % (ref 36.0–46.0)
Hemoglobin: 13.1 g/dL (ref 12.0–15.0)
Immature Granulocytes: 0 %
Lymphocytes Relative: 27 %
Lymphs Abs: 2.1 10*3/uL (ref 0.7–4.0)
MCH: 30.8 pg (ref 26.0–34.0)
MCHC: 33.9 g/dL (ref 30.0–36.0)
MCV: 90.6 fL (ref 80.0–100.0)
Monocytes Absolute: 0.7 10*3/uL (ref 0.1–1.0)
Monocytes Relative: 9 %
Neutro Abs: 4.7 10*3/uL (ref 1.7–7.7)
Neutrophils Relative %: 59 %
Platelets: 214 10*3/uL (ref 150–400)
RBC: 4.26 MIL/uL (ref 3.87–5.11)
RDW: 13.1 % (ref 11.5–15.5)
WBC: 7.8 10*3/uL (ref 4.0–10.5)
nRBC: 0 % (ref 0.0–0.2)

## 2022-07-19 LAB — COMPREHENSIVE METABOLIC PANEL
ALT: 32 U/L (ref 0–44)
AST: 28 U/L (ref 15–41)
Albumin: 4.2 g/dL (ref 3.5–5.0)
Alkaline Phosphatase: 174 U/L — ABNORMAL HIGH (ref 38–126)
Anion gap: 7 (ref 5–15)
BUN: 20 mg/dL (ref 8–23)
CO2: 28 mmol/L (ref 22–32)
Calcium: 9.6 mg/dL (ref 8.9–10.3)
Chloride: 103 mmol/L (ref 98–111)
Creatinine, Ser: 1.02 mg/dL — ABNORMAL HIGH (ref 0.44–1.00)
GFR, Estimated: >60 mL/min (ref >60)
Glucose, Bld: 124 mg/dL — ABNORMAL HIGH (ref 70–99)
Potassium: 3.6 mmol/L (ref 3.5–5.1)
Sodium: 138 mmol/L (ref 135–145)
Total Bilirubin: 0.5 mg/dL (ref 0.3–1.2)
Total Protein: 7.1 g/dL (ref 6.5–8.1)

## 2022-07-19 LAB — VITAMIN D 25 HYDROXY (VIT D DEFICIENCY, FRACTURES): Vit D, 25-Hydroxy: 31.3 ng/mL (ref 30–100)

## 2022-07-24 ENCOUNTER — Inpatient Hospital Stay (HOSPITAL_BASED_OUTPATIENT_CLINIC_OR_DEPARTMENT_OTHER): Payer: 59 | Admitting: Hematology

## 2022-07-24 VITALS — BP 113/76 | HR 89 | Temp 98.1°F | Resp 16 | Ht 61.0 in | Wt 147.7 lb

## 2022-07-24 DIAGNOSIS — D0512 Intraductal carcinoma in situ of left breast: Secondary | ICD-10-CM | POA: Diagnosis not present

## 2022-07-24 NOTE — Progress Notes (Signed)
Patient is taking Anastrozole as prescribed.  She has not missed any doses and reports no side effects at this time.   

## 2022-07-24 NOTE — Patient Instructions (Addendum)
[place] at [facility] Discharge Instructions  You were seen and examined today by Dr. Delton Coombes.  Dr. Delton Coombes discussed your most recent lab work and everything looks good.  Repeat mammogram and Dexa scan January 2024.  Follow-up as scheduled in 6 months.    Thank you for choosing [place] at [facility] to provide your oncology and hematology care.  To afford each patient quality time with our provider, please arrive at least 15 minutes before your scheduled appointment time.   If you have a lab appointment with the [place] please come in thru the Main Entrance and check in at the main information desk.  You need to re-schedule your appointment should you arrive 10 or more minutes late.  We strive to give you quality time with our providers, and arriving late affects you and other patients whose appointments are after yours.  Also, if you no show three or more times for appointments you may be dismissed from the clinic at the providers discretion.     Again, thank you for choosing [facility].  Our hope is that these requests will decrease the amount of time that you wait before being seen by our physicians.       _____________________________________________________________  Should you have questions after your visit to [facility], please contact our office at [phone] and follow the prompts.  Our office hours are 8:00 a.m. and 4:30 p.m. Monday - Friday.  Please note that voicemails left after 4:00 p.m. may not be returned until the following business day.  We are closed weekends and major holidays.  You do have access to a nurse 24-7, just call the main number to the clinic [phone] and do not press any options, hold on the line and a nurse will answer the phone.    For prescription refill requests, have your pharmacy contact our office and allow 72 hours.

## 2022-07-24 NOTE — Progress Notes (Signed)
Sally [name] [address], [place] [address]   Patient Care Team: Sally Meiers, MD as PCP - General (Internal Medicine) Sally Signs, MD as Consulting Physician (General Surgery) Sally Jack, MD as Medical Oncologist (Medical Oncology)  SUMMARY OF ONCOLOGIC HISTORY: Oncology History  Invasive ductal carcinoma of left breast ([place])  09/24/2013 Initial Diagnosis   Invasive ductal carcinoma of left breast   11/12/2013 Surgery   Left breast lumpectomy by Dr. Arnoldo Morale- High grade invasive ductal carcinoma less than 0.1 cm from posterior and medial margins, 0/1 lymph nodes, triple negative   12/31/2013 - 04/20/2014 Chemotherapy   TAC every 21 days.  Adriamycin dropped following cycle 3 for a decreased EF compared to baseline.   05/12/2014 - 07/03/2014 Radiation Therapy   XRT     CHIEF COMPLIANT: Follow-up for left breast high-grade DCIS   INTERVAL HISTORY: [name] is a 66 y.o. female here today for follow up of her left breast high-grade DCIS. Her last visit was on 01/23/2022.   Today she reports feeling good. She is taking anastrozole and is tolerating it well. She reports occasional discomfort under her left breast. She resumed taking vitamin D and calcium.   REVIEW OF SYSTEMS:   Review of Systems  Constitutional:  Negative for appetite change and fatigue.  All other systems reviewed and are negative.   I have reviewed the past medical history, past surgical history, social history and family history with the patient and they are unchanged from previous note.   ALLERGIES:   is allergic to lemon flavor, other, and shrimp [shellfish allergy].   MEDICATIONS:  Current Outpatient Medications  Medication Sig Dispense Refill   amLODipine (NORVASC) 10 MG tablet Take 10 mg by mouth daily.      anastrozole (ARIMIDEX) 1 MG tablet TAKE 1 TABLET BY MOUTH  DAILY 90 tablet 3   atorvastatin (LIPITOR) 40 MG tablet Take 40 mg by mouth  daily.     cetirizine (ZYRTEC) 10 MG tablet Take 10 mg by mouth daily.     Cholecalciferol (VITAMIN D) 125 MCG (5000 UT) CAPS With calcium     fluorometholone (FML) 0.1 % ophthalmic suspension      lisinopril-hydrochlorothiazide (PRINZIDE,ZESTORETIC) 20-12.5 MG tablet Take 1 tablet by mouth daily.      metFORMIN (GLUCOPHAGE) 850 MG tablet Take 850 mg by mouth daily with supper.      No current facility-administered medications for this visit.     PHYSICAL EXAMINATION: Performance status (ECOG): 0 - Asymptomatic  Vitals:   07/24/22 1413  BP: 113/76  Pulse: 89  Resp: 16  Temp: 98.1 F (36.7 C)  SpO2: 97%   Wt Readings from Last 3 Encounters:  07/24/22 147 lb 11.3 oz (67 kg)  01/23/22 147 lb 4.3 oz (66.8 kg)  07/07/21 144 lb 10 oz (65.6 kg)   Physical Exam Vitals reviewed.  Constitutional:      Appearance: Normal appearance.  Cardiovascular:     Rate and Rhythm: Normal rate and regular rhythm.     Pulses: Normal pulses.     Heart sounds: Normal heart sounds.  Pulmonary:     Effort: Pulmonary effort is normal.     Breath sounds: Normal breath sounds.  Chest:  Breasts:    Left: Absent. No mass, skin change (mastectomy site WNL) or tenderness.  Lymphadenopathy:     Upper Body:     Left upper body: No axillary adenopathy.  Neurological:     General: No  focal deficit present.     Mental Status: She is alert and oriented to person, place, and time.  Psychiatric:        Mood and Affect: Mood normal.        Behavior: Behavior normal.     Breast Exam Chaperone: Sally [name]     LABORATORY DATA:  I have reviewed the data as listed    Latest Ref Rng & Units 07/19/2022    2:00 PM 01/16/2022    8:22 AM 07/01/2021    8:12 AM  CMP  Glucose 70 - 99 mg/dL 124  125  101   BUN 8 - 23 mg/dL '20  17  24   '$ Creatinine 0.44 - 1.00 mg/dL 1.02  0.91  1.02   Sodium 135 - 145 mmol/L 138  135  138   Potassium 3.5 - 5.1 mmol/L 3.6  3.4  3.9   Chloride 98 - 111 mmol/L 103  100  100    CO2 22 - 32 mmol/L '28  27  29   '$ Calcium 8.9 - 10.3 mg/dL 9.6  9.4  9.6   Total Protein 6.5 - 8.1 g/dL 7.1  7.8  8.0   Total Bilirubin 0.3 - 1.2 mg/dL 0.5  0.6  0.5   Alkaline Phos 38 - 126 U/L 174  179  167   AST 15 - 41 U/L '28  22  19   '$ ALT 0 - 44 U/L 32  25  23    No results found for: "CAN153" Lab Results  Component Value Date   WBC 7.8 07/19/2022   HGB 13.1 07/19/2022   HCT 38.6 07/19/2022   MCV 90.6 07/19/2022   PLT 214 07/19/2022   NEUTROABS 4.7 07/19/2022    ASSESSMENT:  1.  High-grade left breast DCIS, ER/PR 2% positive: -Left mastectomy on 02/23/2020 with pathology showing 0.5 cm high-grade DCIS with central necrosis.  Margins negative.  ER/PR 2% positive.  pTis PMX. -Anastrozole started on 03/11/2020.   2.  Left triple negative breast cancer: -Status post lumpectomy on 11/12/2013. -Status post 6 cycles of TAC from 12/31/2013 through 04/20/2014. -XRT from 05/02/2014 through 07/03/2014.   PLAN:     1. High-grade left breast DCIS, ER/PR 2% positive: - She is tolerating anastrozole reasonably well.  Occasional hot flashes present. - She reported some discomfort in the left mastectomy site.  Examination did not reveal any palpable masses or nodules.  No palpable adenopathy. - Recommend continuing anastrozole.  RTC 6 months.  We will arrange for mammogram of the right breast prior to next visit after 01/16/2022.   2.  Low vitamin D levels: - Continue calcium and vitamin D supplements.  Vitamin D level is normal at 31. - Plan to repeat DEXA scan prior to next visit.  Breast Cancer therapy associated bone loss: I have recommended calcium, Vitamin D and weight bearing exercises.  Orders placed this encounter:  No orders of the defined types were placed in this encounter.   The patient has a good understanding of the overall plan. She agrees with it. She will call with any problems that may develop before the next visit here.  Sally Jack, MD [place] [phone]   I, Sally [name], am acting as a scribe for Dr. Derek [name].  I, Sally Jack MD, have reviewed the above documentation for accuracy and completeness, and I agree with the above.

## 2023-01-18 ENCOUNTER — Ambulatory Visit (HOSPITAL_COMMUNITY)
Admission: RE | Admit: 2023-01-18 | Discharge: 2023-01-18 | Disposition: A | Discharge status: Home / Self Care | Payer: 59 | Source: Ambulatory Visit | Attending: Hematology | Admitting: Hematology

## 2023-01-18 ENCOUNTER — Inpatient Hospital Stay: Payer: 59 | Attending: Hematology

## 2023-01-18 DIAGNOSIS — Z170 Estrogen receptor positive status [ER+]: Secondary | ICD-10-CM | POA: Diagnosis not present

## 2023-01-18 DIAGNOSIS — D0512 Intraductal carcinoma in situ of left breast: Secondary | ICD-10-CM | POA: Insufficient documentation

## 2023-01-18 DIAGNOSIS — M8580 Other specified disorders of bone density and structure, unspecified site: Secondary | ICD-10-CM | POA: Insufficient documentation

## 2023-01-18 DIAGNOSIS — Z853 Personal history of malignant neoplasm of breast: Secondary | ICD-10-CM | POA: Insufficient documentation

## 2023-01-18 DIAGNOSIS — Z1231 Encounter for screening mammogram for malignant neoplasm of breast: Secondary | ICD-10-CM | POA: Insufficient documentation

## 2023-01-18 DIAGNOSIS — E559 Vitamin D deficiency, unspecified: Secondary | ICD-10-CM | POA: Diagnosis not present

## 2023-01-18 DIAGNOSIS — Z9012 Acquired absence of left breast and nipple: Secondary | ICD-10-CM | POA: Insufficient documentation

## 2023-01-18 LAB — COMPREHENSIVE METABOLIC PANEL
ALT: 28 U/L (ref 0–44)
AST: 23 U/L (ref 15–41)
Albumin: 3.9 g/dL (ref 3.5–5.0)
Alkaline Phosphatase: 182 U/L — ABNORMAL HIGH (ref 38–126)
Anion gap: 9 (ref 5–15)
BUN: 20 mg/dL (ref 8–23)
CO2: 27 mmol/L (ref 22–32)
Calcium: 9.1 mg/dL (ref 8.9–10.3)
Chloride: 102 mmol/L (ref 98–111)
Creatinine, Ser: 0.98 mg/dL (ref 0.44–1.00)
GFR, Estimated: >60 mL/min (ref >60)
Glucose, Bld: 140 mg/dL — ABNORMAL HIGH (ref 70–99)
Potassium: 3.7 mmol/L (ref 3.5–5.1)
Sodium: 138 mmol/L (ref 135–145)
Total Bilirubin: 0.5 mg/dL (ref 0.3–1.2)
Total Protein: 7.3 g/dL (ref 6.5–8.1)

## 2023-01-18 LAB — CBC WITH DIFFERENTIAL/PLATELET
Abs Immature Granulocytes: 0.02 10*3/uL (ref 0.00–0.07)
Basophils Absolute: 0 10*3/uL (ref 0.0–0.1)
Basophils Relative: 1 %
Eosinophils Absolute: 0.3 10*3/uL (ref 0.0–0.5)
Eosinophils Relative: 5 %
HCT: 40.4 % (ref 36.0–46.0)
Hemoglobin: 13.4 g/dL (ref 12.0–15.0)
Immature Granulocytes: 0 %
Lymphocytes Relative: 30 %
Lymphs Abs: 1.8 10*3/uL (ref 0.7–4.0)
MCH: 30.5 pg (ref 26.0–34.0)
MCHC: 33.2 g/dL (ref 30.0–36.0)
MCV: 91.8 fL (ref 80.0–100.0)
Monocytes Absolute: 0.6 10*3/uL (ref 0.1–1.0)
Monocytes Relative: 10 %
Neutro Abs: 3.2 10*3/uL (ref 1.7–7.7)
Neutrophils Relative %: 54 %
Platelets: 194 10*3/uL (ref 150–400)
RBC: 4.4 MIL/uL (ref 3.87–5.11)
RDW: 12.9 % (ref 11.5–15.5)
WBC: 6 10*3/uL (ref 4.0–10.5)
nRBC: 0 % (ref 0.0–0.2)

## 2023-01-19 LAB — MISC LABCORP TEST (SEND OUT): Labcorp test code: 81950

## 2023-01-23 ENCOUNTER — Inpatient Hospital Stay (HOSPITAL_BASED_OUTPATIENT_CLINIC_OR_DEPARTMENT_OTHER): Payer: 59 | Admitting: Hematology

## 2023-01-23 ENCOUNTER — Encounter: Payer: Self-pay | Admitting: Hematology

## 2023-01-23 VITALS — BP 119/77 | HR 94 | Temp 97.3°F | Resp 18 | Ht 61.0 in | Wt 148.4 lb

## 2023-01-23 DIAGNOSIS — D0512 Intraductal carcinoma in situ of left breast: Secondary | ICD-10-CM | POA: Diagnosis not present

## 2023-01-23 NOTE — Progress Notes (Signed)
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 Arnoldo Morale- High grade invasive ductal carcinoma less than 0.1 cm from posterior and medial margins, 0/1 lymph nodes, triple negative   12/31/2013 - 04/20/2014 Chemotherapy   TAC every 21 days.  Adriamycin dropped following cycle 3 for a decreased EF compared to baseline.   05/12/2014 - 07/03/2014 Radiation Therapy   XRT    CURRENT THERAPY: Anastrozole '1mg'$  daily started in March 2021   CHIEF COMPLIANT: Follow-up for left breast high-grade DCIS  INTERVAL HISTORY: [name] is a 67 y.o. female here today for follow up of her left breast high-grade DCIS. Her last visit was on 07/24/22. We discussed her mammogram, bone density scans, and lab work today.  Today, she states that she is doing well overall. Her appetite level is at 100%. Her energy level is at 100%. She is compliant with the Anastrozole '1mg'$  daily and is tolerating this well. She denies any hot flashes or pain.  She takes Vitamin D 5000 units daily. She states that since starting Vitamin D her arthritic pains have improved as well.   REVIEW OF SYSTEMS:   Review of Systems  Constitutional:  Negative for chills, fatigue and fever.  HENT:   Negative for lump/mass, mouth sores, nosebleeds, sore throat and trouble swallowing.   Eyes:  Negative for eye problems.  Respiratory:  Negative for cough.   Cardiovascular:  Negative for chest pain, leg swelling and palpitations.  Gastrointestinal:  Negative for abdominal pain,  constipation, diarrhea, nausea and vomiting.  Endocrine: Negative for hot flashes.  Genitourinary:  Negative for bladder incontinence, difficulty urinating, dysuria, frequency, hematuria and nocturia.   Musculoskeletal:  Negative for arthralgias, back pain, flank pain, myalgias and neck pain.  Skin:  Negative for itching and rash.  Neurological:  Negative for dizziness, headaches and numbness.  Hematological:  Does not bruise/bleed easily.  Psychiatric/Behavioral:  Negative for depression, sleep disturbance and suicidal ideas. The patient is not nervous/anxious.   All other systems reviewed and are negative.   I have reviewed the past medical history, past surgical history, social history and family history with the patient and they are unchanged from previous note.   ALLERGIES:   is allergic to lemon flavor, other, and shrimp [shellfish allergy].   MEDICATIONS:  Current Outpatient Medications  Medication Sig Dispense Refill   amLODipine (NORVASC) 10 MG tablet Take 10 mg by mouth daily.      anastrozole (ARIMIDEX) 1 MG tablet TAKE 1 TABLET BY MOUTH  DAILY 90 tablet 3   atorvastatin (LIPITOR) 40 MG tablet Take 40 mg by mouth daily.     cetirizine (ZYRTEC) 10 MG tablet Take 10 mg by mouth daily.     Cholecalciferol (VITAMIN D) 125 MCG (5000 UT) CAPS With calcium     fluorometholone (FML) 0.1 % ophthalmic suspension      lisinopril-hydrochlorothiazide (PRINZIDE,ZESTORETIC) 20-12.5 MG tablet Take 1 tablet by mouth daily.      metFORMIN (GLUCOPHAGE) 850 MG tablet Take 850 mg by mouth daily with supper.      No current facility-administered  medications for this visit.     PHYSICAL EXAMINATION: Performance status (ECOG): 0 - Asymptomatic  Vitals:   01/23/23 1354  BP: 119/77  Pulse: 94  Resp: 18  Temp: (!) 97.3 F (36.3 C)  SpO2: 99%   Wt Readings from Last 3 Encounters:  01/23/23 67.3 kg (148 lb 6.4 oz)  07/24/22 67 kg (147 lb 11.3 oz)  01/23/22 66.8 kg (147 lb 4.3 oz)    Physical Exam Vitals reviewed. Exam conducted with a chaperone present.  Constitutional:      Appearance: Normal appearance.  Cardiovascular:     Rate and Rhythm: Normal rate and regular rhythm.     Pulses: Normal pulses.     Heart sounds: Normal heart sounds.  Pulmonary:     Effort: Pulmonary effort is normal.     Breath sounds: Normal breath sounds.  Chest:  Breasts:    Right: Normal.     Left: Absent. No swelling, mass, skin change or tenderness.     Comments: Left mastectomy site is WNL Abdominal:     Palpations: Abdomen is soft. There is no hepatomegaly, splenomegaly or mass.     Tenderness: There is no abdominal tenderness.  Lymphadenopathy:     Cervical: No cervical adenopathy.     Right cervical: No superficial cervical adenopathy.    Left cervical: No superficial cervical adenopathy.     Upper Body:     Right upper body: No supraclavicular, axillary or pectoral adenopathy.     Left upper body: No supraclavicular, axillary or pectoral adenopathy.  Neurological:     General: No focal deficit present.     Mental Status: She is alert and oriented to person, place, and time.  Psychiatric:        Mood and Affect: Mood normal.        Behavior: Behavior normal.     Breast Exam Chaperone: Thana Ates     LABORATORY DATA:  I have reviewed the data as listed    Latest Ref Rng & Units 01/18/2023   10:17 AM 07/19/2022    2:00 PM 01/16/2022    8:22 AM  CMP  Glucose 70 - 99 mg/dL 140  124  125   BUN 8 - 23 mg/dL '20  20  17   '$ Creatinine 0.44 - 1.00 mg/dL 0.98  1.02  0.91   Sodium 135 - 145 mmol/L 138  138  135   Potassium 3.5 - 5.1 mmol/L 3.7  3.6  3.4   Chloride 98 - 111 mmol/L 102  103  100   CO2 22 - 32 mmol/L '27  28  27   '$ Calcium 8.9 - 10.3 mg/dL 9.1  9.6  9.4   Total Protein 6.5 - 8.1 g/dL 7.3  7.1  7.8   Total Bilirubin 0.3 - 1.2 mg/dL 0.5  0.5  0.6   Alkaline Phos 38 - 126 U/L 182  174  179   AST 15 - 41 U/L '23  28  22   '$ ALT 0 - 44 U/L 28  32  25    No  results found for: "CAN153" Lab Results  Component Value Date   WBC 6.0 01/18/2023   HGB 13.4 01/18/2023   HCT 40.4 01/18/2023   MCV 91.8 01/18/2023   PLT 194 01/18/2023   NEUTROABS 3.2 01/18/2023    ASSESSMENT:  1.  High-grade left breast DCIS, ER/PR 2% positive: -Left mastectomy on 02/23/2020 with pathology showing 0.5 cm high-grade DCIS with central necrosis.  Margins negative.  ER/PR 2% positive.  pTis PMX. -Anastrozole started on 03/11/2020.   2.  Left triple negative breast cancer: -Status post lumpectomy on 11/12/2013. -Status post 6 cycles of TAC from 12/31/2013 through 04/20/2014. -XRT from 05/02/2014 through 07/03/2014.   PLAN:     1. High-grade left breast DCIS, ER/PR 2% positive: - She is tolerating anastrozole reasonably well with occasional hot flashes. - Left mastectomy site is within normal limits. - Right breast mammogram on 01/18/2023 was BI-RADS Category 1. - RTC 6 months for follow-up with repeat labs and exam.   2.  Low vitamin D levels: - DEXA scan (01/18/2023): Reviewed by me shows T-score 0.1, normal. - Vitamin D level is 46.  She is taking vitamin D 5000 units daily. - Continue calcium and vitamin D supplements.  Breast Cancer therapy associated bone loss: I have recommended calcium, Vitamin D and weight bearing exercises.  Orders placed this encounter:  Orders Placed This Encounter  Procedures   CBC with Differential/Platelet   Comprehensive metabolic panel    The patient has a good understanding of the overall plan. She agrees with it. She will call with any problems that may develop before the next visit here.   I,Alexis Herring,acting as a Education administrator for [employer], MD.,have documented all relevant documentation on the behalf of Derek Jack, MD,as directed by  Derek Jack, MD while in the presence of Derek Jack, MD.  I, Derek Jack MD, have reviewed the above documentation for accuracy and completeness, and I  agree with the above.    Derek Jack, MD [place] [phone]

## 2023-01-23 NOTE — Patient Instructions (Signed)
Sally [name]  Discharge Instructions  You were seen and examined today by Dr. Delton Coombes.  Dr. Delton Coombes discussed your most recent lab work which revealed that everything looks good and stable.  Follow-up as scheduled in 6 months with labs.    Thank you for choosing [facility] to provide your oncology and hematology care.   To afford each patient quality time with our provider, please arrive at least 15 minutes before your scheduled appointment time. You may need to reschedule your appointment if you arrive late (10 or more minutes). Arriving late affects you and other patients whose appointments are after yours.  Also, if you miss three or more appointments without notifying the office, you may be dismissed from the clinic at the provider's discretion.    Again, thank you for choosing [facility].  Our hope is that these requests will decrease the amount of time that you wait before being seen by our physicians.   If you have a lab appointment with the [facility] [place] please come in thru the Main Entrance and check in at the main information desk.           _____________________________________________________________  Should you have questions after your visit to [facility], please contact our office at [phone] and follow the prompts.  Our office hours are 8:00 a.m. to 4:30 p.m. Monday - Thursday and 8:00 a.m. to 2:30 p.m. Friday.  Please note that voicemails left after 4:00 p.m. may not be returned until the following business day.  We are closed weekends and all major holidays.  You do have access to a nurse 24-7, just call the main number to the clinic [phone] and do not press any options, hold on the line and a nurse will answer the phone.    For prescription refill requests, have your pharmacy contact our office and allow 72 hours.    Masks are optional in the cancer centers. If you would  like for your care team to wear a mask while they are taking care of you, please let them know. You may have one support person who is at least 65 years old accompany you for your appointments.

## 2023-07-17 ENCOUNTER — Inpatient Hospital Stay: Payer: 59

## 2023-07-18 ENCOUNTER — Inpatient Hospital Stay: Payer: 59 | Attending: Hematology

## 2023-07-18 DIAGNOSIS — Z9012 Acquired absence of left breast and nipple: Secondary | ICD-10-CM | POA: Insufficient documentation

## 2023-07-18 DIAGNOSIS — E559 Vitamin D deficiency, unspecified: Secondary | ICD-10-CM | POA: Diagnosis not present

## 2023-07-18 DIAGNOSIS — Z853 Personal history of malignant neoplasm of breast: Secondary | ICD-10-CM | POA: Diagnosis not present

## 2023-07-18 DIAGNOSIS — D0512 Intraductal carcinoma in situ of left breast: Secondary | ICD-10-CM

## 2023-07-18 DIAGNOSIS — Z79811 Long term (current) use of aromatase inhibitors: Secondary | ICD-10-CM | POA: Diagnosis not present

## 2023-07-18 DIAGNOSIS — Z9221 Personal history of antineoplastic chemotherapy: Secondary | ICD-10-CM | POA: Insufficient documentation

## 2023-07-18 DIAGNOSIS — Z923 Personal history of irradiation: Secondary | ICD-10-CM | POA: Insufficient documentation

## 2023-07-18 DIAGNOSIS — I890 Lymphedema, not elsewhere classified: Secondary | ICD-10-CM | POA: Diagnosis not present

## 2023-07-18 LAB — CBC WITH DIFFERENTIAL/PLATELET
Abs Immature Granulocytes: 0.02 10*3/uL (ref 0.00–0.07)
Basophils Absolute: 0.1 10*3/uL (ref 0.0–0.1)
Basophils Relative: 1 %
Eosinophils Absolute: 0.3 10*3/uL (ref 0.0–0.5)
Eosinophils Relative: 4 %
HCT: 38.4 % (ref 36.0–46.0)
Hemoglobin: 13 g/dL (ref 12.0–15.0)
Immature Granulocytes: 0 %
Lymphocytes Relative: 24 %
Lymphs Abs: 1.6 10*3/uL (ref 0.7–4.0)
MCH: 30.7 pg (ref 26.0–34.0)
MCHC: 33.9 g/dL (ref 30.0–36.0)
MCV: 90.6 fL (ref 80.0–100.0)
Monocytes Absolute: 0.8 10*3/uL (ref 0.1–1.0)
Monocytes Relative: 11 %
Neutro Abs: 4.1 10*3/uL (ref 1.7–7.7)
Neutrophils Relative %: 60 %
Platelets: 215 10*3/uL (ref 150–400)
RBC: 4.24 MIL/uL (ref 3.87–5.11)
RDW: 13 % (ref 11.5–15.5)
WBC: 6.8 10*3/uL (ref 4.0–10.5)
nRBC: 0 % (ref 0.0–0.2)

## 2023-07-18 LAB — COMPREHENSIVE METABOLIC PANEL
ALT: 26 U/L (ref 0–44)
AST: 20 U/L (ref 15–41)
Albumin: 4.3 g/dL (ref 3.5–5.0)
Alkaline Phosphatase: 159 U/L — ABNORMAL HIGH (ref 38–126)
Anion gap: 9 (ref 5–15)
BUN: 20 mg/dL (ref 8–23)
CO2: 27 mmol/L (ref 22–32)
Calcium: 9.5 mg/dL (ref 8.9–10.3)
Chloride: 100 mmol/L (ref 98–111)
Creatinine, Ser: 1.09 mg/dL — ABNORMAL HIGH (ref 0.44–1.00)
GFR, Estimated: 57 mL/min — ABNORMAL LOW (ref >60)
Glucose, Bld: 131 mg/dL — ABNORMAL HIGH (ref 70–99)
Potassium: 3.6 mmol/L (ref 3.5–5.1)
Sodium: 136 mmol/L (ref 135–145)
Total Bilirubin: 0.7 mg/dL (ref 0.3–1.2)
Total Protein: 7.6 g/dL (ref 6.5–8.1)

## 2023-07-24 ENCOUNTER — Inpatient Hospital Stay: Payer: 59 | Admitting: Oncology

## 2023-07-24 ENCOUNTER — Inpatient Hospital Stay (HOSPITAL_BASED_OUTPATIENT_CLINIC_OR_DEPARTMENT_OTHER): Payer: 59 | Admitting: Oncology

## 2023-07-24 DIAGNOSIS — D0512 Intraductal carcinoma in situ of left breast: Secondary | ICD-10-CM

## 2023-07-24 NOTE — Progress Notes (Signed)
[facility] [address], [place] [address]   Patient Care Team: Benetta Spar, MD as PCP - General (Internal Medicine) Franky Macho, MD as Consulting Physician (General Surgery) Doreatha Massed, MD as Medical Oncologist (Medical Oncology)  SUMMARY OF ONCOLOGIC HISTORY: Oncology History  Invasive ductal carcinoma of left breast (HCC)  09/24/2013 Initial Diagnosis   Invasive ductal carcinoma of left breast   11/12/2013 Surgery   Left breast lumpectomy by Dr. Lovell Sheehan- High grade invasive ductal carcinoma less than 0.1 cm from posterior and medial margins, 0/1 lymph nodes, triple negative   12/31/2013 - 04/20/2014 Chemotherapy   TAC every 21 days.  Adriamycin dropped following cycle 3 for a decreased EF compared to baseline.   05/12/2014 - 07/03/2014 Radiation Therapy   XRT    CURRENT THERAPY: Anastrozole 1mg  daily started in March 2021  CHIEF COMPLIANT: Follow-up for left breast high-grade DCIS  INTERVAL HISTORY: [name] is a 67 y.o. female here today for follow up of her left breast high-grade DCIS. Her last visit was on 01/24/22.   Overall doing well.  States she has noticed firmness to her left chest wall where her left breast was.  States she feels like it was more "saggy" and now feels more firm over the past few months.  No pain but slightly uncomfortable with tight fitted bras.  No pain or swelling in her left arm.  She continues to be compliant with her anastrozole. Has not noticed any recent hot flashes.  She takes Vitamin D 5000 units daily. She states that since starting Vitamin D her arthritic pains have improved as well.    REVIEW OF SYSTEMS:   Review of Systems  Constitutional:  Negative for fatigue.  Gastrointestinal:  Negative for diarrhea, nausea and vomiting.  Skin:        Left chest wall fullness  Neurological:  Negative for dizziness.  Hematological:  Negative for adenopathy.    I have reviewed the past  medical history, past surgical history, social history and family history with the patient and they are unchanged from previous note.   ALLERGIES:   is allergic to lemon flavor, other, and shrimp [shellfish allergy].   MEDICATIONS:  Current Outpatient Medications  Medication Sig Dispense Refill   amLODipine (NORVASC) 10 MG tablet Take 10 mg by mouth daily.      anastrozole (ARIMIDEX) 1 MG tablet TAKE 1 TABLET BY MOUTH  DAILY 90 tablet 3   atorvastatin (LIPITOR) 40 MG tablet Take 40 mg by mouth daily.     cetirizine (ZYRTEC) 10 MG tablet Take 10 mg by mouth daily.     Cholecalciferol (VITAMIN D) 125 MCG (5000 UT) CAPS With calcium     fluorometholone (FML) 0.1 % ophthalmic suspension      lisinopril-hydrochlorothiazide (PRINZIDE,ZESTORETIC) 20-12.5 MG tablet Take 1 tablet by mouth daily.      metFORMIN (GLUCOPHAGE) 850 MG tablet Take 850 mg by mouth daily with supper.      No current facility-administered medications for this visit.     PHYSICAL EXAMINATION: Performance status (ECOG): 0 - Asymptomatic  There were no vitals filed for this visit.  Wt Readings from Last 3 Encounters:  01/23/23 148 lb 6.4 oz (67.3 kg)  07/24/22 147 lb 11.3 oz (67 kg)  01/23/22 147 lb 4.3 oz (66.8 kg)   Physical Exam Vitals reviewed. Exam conducted with a chaperone present.  Constitutional:      Appearance: Normal appearance.  Cardiovascular:  Rate and Rhythm: Normal rate and regular rhythm.     Pulses: Normal pulses.     Heart sounds: Normal heart sounds.  Pulmonary:     Effort: Pulmonary effort is normal.     Breath sounds: Normal breath sounds.  Chest:  Breasts:    Right: Normal.     Left: Absent. No swelling, mass, skin change or tenderness.     Comments: Left mastectomy site is WNL. Possible fullness below the mastectomy scar. No axillary lymphadenopathy.  No edema to left arm.  Full range of motion. Abdominal:     Palpations: Abdomen is soft. There is no hepatomegaly, splenomegaly  or mass.     Tenderness: There is no abdominal tenderness.  Lymphadenopathy:     Cervical: No cervical adenopathy.     Right cervical: No superficial cervical adenopathy.    Left cervical: No superficial cervical adenopathy.     Upper Body:     Right upper body: No supraclavicular, axillary or pectoral adenopathy.     Left upper body: No supraclavicular, axillary or pectoral adenopathy.  Neurological:     General: No focal deficit present.     Mental Status: She is alert and oriented to person, place, and time.  Psychiatric:        Mood and Affect: Mood normal.        Behavior: Behavior normal.     Breast Exam Chaperone:    LABORATORY DATA:  I have reviewed the data as listed    Latest Ref Rng & Units 07/18/2023    2:48 PM 01/18/2023   10:17 AM 07/19/2022    2:00 PM  CMP  Glucose 70 - 99 mg/dL 034  742  595   BUN 8 - 23 mg/dL 20  20  20    Creatinine 0.44 - 1.00 mg/dL 6.38  7.56  4.33   Sodium 135 - 145 mmol/L 136  138  138   Potassium 3.5 - 5.1 mmol/L 3.6  3.7  3.6   Chloride 98 - 111 mmol/L 100  102  103   CO2 22 - 32 mmol/L 27  27  28    Calcium 8.9 - 10.3 mg/dL 9.5  9.1  9.6   Total Protein 6.5 - 8.1 g/dL 7.6  7.3  7.1   Total Bilirubin 0.3 - 1.2 mg/dL 0.7  0.5  0.5   Alkaline Phos 38 - 126 U/L 159  182  174   AST 15 - 41 U/L 20  23  28    ALT 0 - 44 U/L 26  28  32    No results found for: "CAN153" Lab Results  Component Value Date   WBC 6.8 07/18/2023   HGB 13.0 07/18/2023   HCT 38.4 07/18/2023   MCV 90.6 07/18/2023   PLT 215 07/18/2023   NEUTROABS 4.1 07/18/2023    ASSESSMENT:  1.  High-grade left breast DCIS, ER/PR 2% positive: -Left mastectomy on 02/23/2020 with pathology showing 0.5 cm high-grade DCIS with central necrosis.  Margins negative.  ER/PR 2% positive.  pTis PMX. -Anastrozole started on 03/11/2020.   2.  Left triple negative breast cancer: -Status post lumpectomy on 11/12/2013. -Status post 6 cycles of TAC from 12/31/2013 through 04/20/2014. -XRT  from 05/02/2014 through 07/03/2014.   PLAN:     1. High-grade left breast DCIS, ER/PR 2% positive: - She is tolerating anastrozole reasonably well with occasional hot flashes. - Left mastectomy site is within normal limits. - Right breast mammogram on 01/18/2023 was BI-RADS Category 1. -  RTC 6 months for follow-up with repeat labs, mammogram and exam.  2.  Low vitamin D levels: - DEXA scan (01/18/2023): Reviewed by me shows T-score 0.1, normal. - Vitamin D level is 46.  She is taking vitamin D 5000 units daily. - Continue calcium and vitamin D supplements.  3.  Lymphedema: -Discussed firmness she is feeling below the scar is likely secondary to lymphedema. -Recommend referral to lymphedema clinic.  -Orders placed she should expect a phone call in the next few days.  Breast Cancer therapy associated bone loss: I have recommended calcium, Vitamin D and weight bearing exercises.  PLAN SUMMARY: >> Mammogram in 6 months.  >> Referral to lymphedema clinic. >> RTC in 6months after mammogram for labs, f/u and review of mammogram.     Orders placed this encounter:  Orders Placed This Encounter  Procedures   MM 3D SCREENING MAMMOGRAM UNILATERAL RIGHT BREAST    The patient has a good understanding of the overall plan. She agrees with it. She will call with any problems that may develop before the next visit here.  I spent 25 minutes dedicated to the care of this patient (face-to-face and non-face-to-face) on the date of the encounter to include what is described in the assessment and plan.  Durenda Hurt, NP 07/24/2023 11:22 AM

## 2023-11-27 ENCOUNTER — Other Ambulatory Visit (HOSPITAL_COMMUNITY): Payer: Self-pay | Admitting: Internal Medicine

## 2023-11-27 DIAGNOSIS — F17200 Nicotine dependence, unspecified, uncomplicated: Secondary | ICD-10-CM

## 2023-12-15 ENCOUNTER — Ambulatory Visit (HOSPITAL_COMMUNITY)
Admission: RE | Admit: 2023-12-15 | Discharge: 2023-12-15 | Disposition: A | Discharge status: Home / Self Care | Payer: 59 | Source: Ambulatory Visit | Attending: Internal Medicine | Admitting: Internal Medicine

## 2023-12-15 DIAGNOSIS — F17200 Nicotine dependence, unspecified, uncomplicated: Secondary | ICD-10-CM | POA: Insufficient documentation

## 2024-01-03 ENCOUNTER — Encounter: Payer: Self-pay | Admitting: Adult Health

## 2024-01-03 ENCOUNTER — Ambulatory Visit: Payer: 59 | Admitting: Adult Health

## 2024-01-03 VITALS — BP 126/82 | HR 95 | Ht 61.0 in | Wt 149.0 lb

## 2024-01-03 DIAGNOSIS — I10 Essential (primary) hypertension: Secondary | ICD-10-CM | POA: Diagnosis not present

## 2024-01-03 DIAGNOSIS — Z1331 Encounter for screening for depression: Secondary | ICD-10-CM

## 2024-01-03 DIAGNOSIS — Z90710 Acquired absence of both cervix and uterus: Secondary | ICD-10-CM

## 2024-01-03 DIAGNOSIS — Z4689 Encounter for fitting and adjustment of other specified devices: Secondary | ICD-10-CM | POA: Diagnosis not present

## 2024-01-03 DIAGNOSIS — N8111 Cystocele, midline: Secondary | ICD-10-CM

## 2024-01-03 NOTE — Progress Notes (Addendum)
  Subjective:     Patient ID: Sally [name], female   DOB: 1956-11-09, 68 y.o.   MRN: [id number]  HPI Sally [name] is a 68 year old black female,divorced, sp hysterectomy, in complaining of feeling a bubble in her vagina for about 2 months. She is still working.  PCP is Dr Carlette  Review of Systems +feels bubble in vagina for about 2 months Denies any problems with urination or bowels May have sex occasionally Reviewed past medical,surgical, social and family history. Reviewed medications and allergies.     Objective:   Physical Exam BP 126/82 (BP Location: Right Arm, Patient Position: Sitting, Cuff Size: Normal)   Pulse 95   Ht 5' 1 (1.549 m)   Wt 149 lb (67.6 kg)   BMI 28.15 kg/m     Skin warm and dry.Pelvic: external genitalia is normal in appearance no lesions, vagina: pale, +cystocele, Dr Jayne in and fitted her with #4 Milex ring pessary with support, urethra has small caruncle, cervix and uterus are absent, adnexa: no masses or tenderness noted. Bladder is non tender and no masses felt. On rectal exam no masses or rectocele. AA is 1 Fall risk is low    01/03/2024   11:33 AM 12/02/2020    1:38 PM 12/22/2014   11:13 AM  Depression screen PHQ 2/9  Decreased Interest 0 0 0  Down, Depressed, Hopeless 0 0 0  PHQ - 2 Score 0 0 0  Altered sleeping 1    Tired, decreased energy 0    Change in appetite 0    Feeling bad or failure about yourself  0    Trouble concentrating 0    Moving slowly or fidgety/restless 0    Suicidal thoughts 0    PHQ-9 Score 1         01/03/2024   11:33 AM  GAD 7 : Generalized Anxiety Score  Nervous, Anxious, on Edge 0  Control/stop worrying 0  Worry too much - different things 0  Trouble relaxing 0  Restless 0  Easily annoyed or irritable 0  Afraid - awful might happen 0  Total GAD 7 Score 0    Upstream - 01/03/24 1146       Pregnancy Intention Screening   Does the patient want to become pregnant in the next year? N/A    Does the patient's  partner want to become pregnant in the next year? N/A    Would the patient like to discuss contraceptive options today? N/A      Contraception Wrap Up   Current Method Abstinence;Female Sterilization   hyst   End Method Abstinence;Female Sterilization   hyst   Contraception Counseling Provided No            Examination chaperoned by Clarita Salt LPN    Assessment:     1. Cystocele, midline (Primary) Review Medical Explainer #4  2. S/P hysterectomy  3. Encounter for fitting and adjustment of pessary Fitted with #4 ring pessary with support     4. Hypertension Take meds and follow up with PCP Plan:     Follow up in 4 weeks for pessary maintenance with me

## 2024-01-18 ENCOUNTER — Other Ambulatory Visit: Payer: Self-pay

## 2024-01-18 DIAGNOSIS — D0512 Intraductal carcinoma in situ of left breast: Secondary | ICD-10-CM

## 2024-01-21 ENCOUNTER — Ambulatory Visit (HOSPITAL_COMMUNITY)
Admission: RE | Admit: 2024-01-21 | Discharge: 2024-01-21 | Disposition: A | Discharge status: Home / Self Care | Payer: 59 | Source: Ambulatory Visit | Attending: Oncology | Admitting: Oncology

## 2024-01-21 ENCOUNTER — Inpatient Hospital Stay: Payer: 59 | Attending: Hematology

## 2024-01-21 DIAGNOSIS — E559 Vitamin D deficiency, unspecified: Secondary | ICD-10-CM | POA: Diagnosis not present

## 2024-01-21 DIAGNOSIS — Z9012 Acquired absence of left breast and nipple: Secondary | ICD-10-CM | POA: Insufficient documentation

## 2024-01-21 DIAGNOSIS — D0512 Intraductal carcinoma in situ of left breast: Secondary | ICD-10-CM | POA: Diagnosis present

## 2024-01-21 DIAGNOSIS — Z1231 Encounter for screening mammogram for malignant neoplasm of breast: Secondary | ICD-10-CM | POA: Diagnosis present

## 2024-01-21 DIAGNOSIS — Z853 Personal history of malignant neoplasm of breast: Secondary | ICD-10-CM | POA: Insufficient documentation

## 2024-01-21 LAB — CBC WITH DIFFERENTIAL/PLATELET
Abs Immature Granulocytes: 0.03 10*3/uL (ref 0.00–0.07)
Basophils Absolute: 0.1 10*3/uL (ref 0.0–0.1)
Basophils Relative: 1 %
Eosinophils Absolute: 0.2 10*3/uL (ref 0.0–0.5)
Eosinophils Relative: 3 %
HCT: 41.3 % (ref 36.0–46.0)
Hemoglobin: 13.5 g/dL (ref 12.0–15.0)
Immature Granulocytes: 0 %
Lymphocytes Relative: 29 %
Lymphs Abs: 2.5 10*3/uL (ref 0.7–4.0)
MCH: 30 pg (ref 26.0–34.0)
MCHC: 32.7 g/dL (ref 30.0–36.0)
MCV: 91.8 fL (ref 80.0–100.0)
Monocytes Absolute: 0.7 10*3/uL (ref 0.1–1.0)
Monocytes Relative: 9 %
Neutro Abs: 5 10*3/uL (ref 1.7–7.7)
Neutrophils Relative %: 58 %
Platelets: 237 10*3/uL (ref 150–400)
RBC: 4.5 MIL/uL (ref 3.87–5.11)
RDW: 12.9 % (ref 11.5–15.5)
WBC: 8.4 10*3/uL (ref 4.0–10.5)
nRBC: 0 % (ref 0.0–0.2)

## 2024-01-21 LAB — COMPREHENSIVE METABOLIC PANEL
ALT: 26 U/L (ref 0–44)
AST: 20 U/L (ref 15–41)
Albumin: 4.3 g/dL (ref 3.5–5.0)
Alkaline Phosphatase: 171 U/L — ABNORMAL HIGH (ref 38–126)
Anion gap: 11 (ref 5–15)
BUN: 24 mg/dL — ABNORMAL HIGH (ref 8–23)
CO2: 25 mmol/L (ref 22–32)
Calcium: 9.9 mg/dL (ref 8.9–10.3)
Chloride: 99 mmol/L (ref 98–111)
Creatinine, Ser: 0.97 mg/dL (ref 0.44–1.00)
GFR, Estimated: >60 mL/min (ref >60)
Glucose, Bld: 115 mg/dL — ABNORMAL HIGH (ref 70–99)
Potassium: 3.6 mmol/L (ref 3.5–5.1)
Sodium: 135 mmol/L (ref 135–145)
Total Bilirubin: 0.7 mg/dL (ref 0.0–1.2)
Total Protein: 7.7 g/dL (ref 6.5–8.1)

## 2024-01-21 LAB — VITAMIN D 25 HYDROXY (VIT D DEFICIENCY, FRACTURES): Vit D, 25-Hydroxy: 48.52 ng/mL (ref 30–100)

## 2024-01-31 ENCOUNTER — Ambulatory Visit: Payer: 59 | Admitting: Adult Health

## 2024-01-31 ENCOUNTER — Inpatient Hospital Stay: Payer: 59 | Attending: Oncology | Admitting: Oncology

## 2024-01-31 VITALS — BP 97/63 | HR 88 | Temp 96.2°F | Resp 18 | Wt 149.9 lb

## 2024-01-31 DIAGNOSIS — E559 Vitamin D deficiency, unspecified: Secondary | ICD-10-CM | POA: Diagnosis not present

## 2024-01-31 DIAGNOSIS — D0512 Intraductal carcinoma in situ of left breast: Secondary | ICD-10-CM

## 2024-01-31 DIAGNOSIS — Z170 Estrogen receptor positive status [ER+]: Secondary | ICD-10-CM | POA: Insufficient documentation

## 2024-01-31 NOTE — Progress Notes (Signed)
 [facility] [address], [place] [address]   Patient Care Team: Carlette Benita Area, MD as PCP - General (Internal Medicine) Mavis Anes, MD as Consulting Physician (General Surgery) Rogers Hai, MD as Medical Oncologist (Medical Oncology)  SUMMARY OF ONCOLOGIC HISTORY: Oncology History  Invasive ductal carcinoma of left breast (HCC)  09/24/2013 Initial Diagnosis   Invasive ductal carcinoma of left breast   11/12/2013 Surgery   Left breast lumpectomy by Dr. Mavis- High grade invasive ductal carcinoma less than 0.1 cm from posterior and medial margins, 0/1 lymph nodes, triple negative   12/31/2013 - 04/20/2014 Chemotherapy   TAC every 21 days.  Adriamycin  dropped following cycle 3 for a decreased EF compared to baseline.   05/12/2014 - 07/03/2014 Radiation Therapy   XRT    CURRENT THERAPY: Anastrozole  1mg  daily started in March 2021  CHIEF COMPLIANT: Follow-up for left breast high-grade DCIS  INTERVAL HISTORY: Sally [name] is a 68 y.o. female here today for follow up of her left breast high-grade DCIS. Her last visit was on 07/24/2023.  Since her last visit, she denies any hospitalizations, surgeries or changes to her health.  She met with GYN on 01/03/2024 for cystocele and was fitted with Milex ring pessary with support.  She had a mammogram on 01/21/2024 which was read as BI-RADS Category 1 negative.  Screening mammogram in 1 year.  Overall doing well.  Reports previously having left chest wall fullness which has essentially resolved.  She feels like it might have been her prosthetic bra all pushing up against her scar tissue.  No pain or swelling in her left arm.  She continues to be compliant with her anastrozole .  No hot flashes.   She takes Vitamin D  5000 units daily. She states that since starting Vitamin D  her arthritic pains have improved as well.    REVIEW OF SYSTEMS:   Review of Systems  Constitutional:  Negative for  fatigue.  Endocrine: Negative for hot flashes.  Musculoskeletal:  Negative for arthralgias.  Neurological:  Negative for headaches.  Hematological:  Negative for adenopathy.    I have reviewed the past medical history, past surgical history, social history and family history with the patient and they are unchanged from previous note.   ALLERGIES:   is allergic to lemon flavoring agent (non-screening), other, and shrimp [shellfish allergy].   MEDICATIONS:  Current Outpatient Medications  Medication Sig Dispense Refill   albuterol (VENTOLIN HFA) 108 (90 Base) MCG/ACT inhaler Inhale into the lungs.     amLODipine  (NORVASC ) 10 MG tablet Take 10 mg by mouth daily.      anastrozole  (ARIMIDEX ) 1 MG tablet TAKE 1 TABLET BY MOUTH  DAILY 90 tablet 3   atorvastatin  (LIPITOR) 40 MG tablet Take 40 mg by mouth daily.     BREO ELLIPTA 100-25 MCG/ACT AEPB Inhale 1 puff into the lungs daily.     Cholecalciferol (VITAMIN D ) 125 MCG (5000 UT) CAPS With calcium      fluorometholone (FML) 0.1 % ophthalmic suspension      lisinopril -hydrochlorothiazide  (PRINZIDE ,ZESTORETIC ) 20-12.5 MG tablet Take 1 tablet by mouth daily.      metFORMIN  (GLUCOPHAGE ) 850 MG tablet Take 850 mg by mouth 2 (two) times daily with a meal.     metFORMIN  (GLUCOPHAGE ) 1000 MG tablet Take 1,000 mg by mouth 2 (two) times daily. (Patient not taking: Reported on 01/31/2024)     No current facility-administered medications for this visit.     PHYSICAL EXAMINATION: Performance  status (ECOG): 0 - Asymptomatic  Vitals:   01/31/24 1016  BP: 97/63  Pulse: 88  Resp: 18  Temp: (!) 96.2 F (35.7 C)  SpO2: 99%    Wt Readings from Last 3 Encounters:  01/31/24 149 lb 14.6 oz (68 kg)  01/03/24 149 lb (67.6 kg)  01/23/23 148 lb 6.4 oz (67.3 kg)   Physical Exam Constitutional:      Appearance: Normal appearance.  Cardiovascular:     Rate and Rhythm: Normal rate and regular rhythm.  Pulmonary:     Effort: Pulmonary effort is  normal.     Breath sounds: Normal breath sounds.  Abdominal:     General: Bowel sounds are normal.     Palpations: Abdomen is soft.  Musculoskeletal:        General: No swelling. Normal range of motion.  Neurological:     Mental Status: She is alert and oriented to person, place, and time. Mental status is at baseline.     Breast Exam Chaperone:    LABORATORY DATA:  I have reviewed the data as listed    Latest Ref Rng & Units 01/21/2024    8:57 AM 07/18/2023    2:48 PM 01/18/2023   10:17 AM  CMP  Glucose 70 - 99 mg/dL 884  868  859   BUN 8 - 23 mg/dL 24  20  20    Creatinine 0.44 - 1.00 mg/dL 9.02  8.90  9.01   Sodium 135 - 145 mmol/L 135  136  138   Potassium 3.5 - 5.1 mmol/L 3.6  3.6  3.7   Chloride 98 - 111 mmol/L 99  100  102   CO2 22 - 32 mmol/L 25  27  27    Calcium  8.9 - 10.3 mg/dL 9.9  9.5  9.1   Total Protein 6.5 - 8.1 g/dL 7.7  7.6  7.3   Total Bilirubin 0.0 - 1.2 mg/dL 0.7  0.7  0.5   Alkaline Phos 38 - 126 U/L 171  159  182   AST 15 - 41 U/L 20  20  23    ALT 0 - 44 U/L 26  26  28     No results found for: [id number] Lab Results  Component Value Date   WBC 8.4 01/21/2024   HGB 13.5 01/21/2024   HCT 41.3 01/21/2024   MCV 91.8 01/21/2024   PLT 237 01/21/2024   NEUTROABS 5.0 01/21/2024    ASSESSMENT:  1.  High-grade left breast DCIS, ER/PR 2% positive: -Left mastectomy on 02/23/2020 with pathology showing 0.5 cm high-grade DCIS with central necrosis.  Margins negative.  ER/PR 2% positive.  pTis PMX. -Anastrozole  started on 03/11/2020.   2.  Left triple negative breast cancer: -Status post lumpectomy on 11/12/2013. -Status post 6 cycles of TAC from 12/31/2013 through 04/20/2014. -XRT from 05/02/2014 through 07/03/2014.   PLAN:     1. High-grade left breast DCIS, ER/PR 2% positive: - She is tolerating anastrozole  reasonably well with occasional hot flashes. - Right breast mammogram on 01/21/24 was read as BI-RADS Category 1 negative. -She will complete 5 years of  anastrozole  on March 2026. Np BCI testing given non invasive BC.  - RTC 6 months for follow-up with repeat labs and exam.   2.  Low vitamin D  levels: - DEXA scan (01/18/2023): Reviewed by me shows T-score 0.1, normal. - Vitamin D  level is 48.52.  She is taking vitamin D  5000 units daily. - Continue calcium  and vitamin D  supplements.  Breast Cancer  therapy associated bone loss: I have recommended calcium , Vitamin D  and weight bearing exercises.  PLAN SUMMARY: >> RTC in 6 months with labs and see provider.    Orders placed this encounter:  Orders Placed This Encounter  Procedures   CBC with Differential/Platelet   Comprehensive metabolic panel   VITAMIN D  25 Hydroxy (Vit-D Deficiency, Fractures)   The patient has a good understanding of the overall plan. She agrees with it. She will call with any problems that may develop before the next visit here.  I spent 25 minutes dedicated to the care of this patient (face-to-face and non-face-to-face) on the date of the encounter to include what is described in the assessment and plan.  Delon Hope, NP 01/31/2024 10:53 AM

## 2024-02-05 ENCOUNTER — Ambulatory Visit: Payer: 59 | Admitting: Adult Health

## 2024-06-19 ENCOUNTER — Encounter (INDEPENDENT_AMBULATORY_CARE_PROVIDER_SITE_OTHER): Payer: Self-pay | Admitting: *Deleted

## 2024-07-24 ENCOUNTER — Inpatient Hospital Stay: Payer: 59

## 2024-07-25 ENCOUNTER — Inpatient Hospital Stay: Attending: Oncology

## 2024-07-25 DIAGNOSIS — Z79811 Long term (current) use of aromatase inhibitors: Secondary | ICD-10-CM | POA: Insufficient documentation

## 2024-07-25 DIAGNOSIS — Z170 Estrogen receptor positive status [ER+]: Secondary | ICD-10-CM | POA: Insufficient documentation

## 2024-07-25 DIAGNOSIS — Z9012 Acquired absence of left breast and nipple: Secondary | ICD-10-CM | POA: Insufficient documentation

## 2024-07-25 DIAGNOSIS — E559 Vitamin D deficiency, unspecified: Secondary | ICD-10-CM | POA: Diagnosis not present

## 2024-07-25 DIAGNOSIS — D0512 Intraductal carcinoma in situ of left breast: Secondary | ICD-10-CM | POA: Insufficient documentation

## 2024-07-25 LAB — COMPREHENSIVE METABOLIC PANEL WITH GFR
ALT: 30 U/L (ref 0–44)
AST: 22 U/L (ref 15–41)
Albumin: 4.3 g/dL (ref 3.5–5.0)
Alkaline Phosphatase: 197 U/L — ABNORMAL HIGH (ref 38–126)
Anion gap: 13 (ref 5–15)
BUN: 23 mg/dL (ref 8–23)
CO2: 26 mmol/L (ref 22–32)
Calcium: 10.1 mg/dL (ref 8.9–10.3)
Chloride: 99 mmol/L (ref 98–111)
Creatinine, Ser: 0.93 mg/dL (ref 0.44–1.00)
GFR, Estimated: >60 mL/min (ref >60)
Glucose, Bld: 115 mg/dL — ABNORMAL HIGH (ref 70–99)
Potassium: 3.9 mmol/L (ref 3.5–5.1)
Sodium: 138 mmol/L (ref 135–145)
Total Bilirubin: 0.6 mg/dL (ref 0.0–1.2)
Total Protein: 7.6 g/dL (ref 6.5–8.1)

## 2024-07-25 LAB — CBC WITH DIFFERENTIAL/PLATELET
Abs Immature Granulocytes: 0.01 K/uL (ref 0.00–0.07)
Basophils Absolute: 0.1 K/uL (ref 0.0–0.1)
Basophils Relative: 1 %
Eosinophils Absolute: 0.3 K/uL (ref 0.0–0.5)
Eosinophils Relative: 3 %
HCT: 40.7 % (ref 36.0–46.0)
Hemoglobin: 14 g/dL (ref 12.0–15.0)
Immature Granulocytes: 0 %
Lymphocytes Relative: 30 %
Lymphs Abs: 2.5 K/uL (ref 0.7–4.0)
MCH: 31.5 pg (ref 26.0–34.0)
MCHC: 34.4 g/dL (ref 30.0–36.0)
MCV: 91.5 fL (ref 80.0–100.0)
Monocytes Absolute: 0.7 K/uL (ref 0.1–1.0)
Monocytes Relative: 9 %
Neutro Abs: 4.7 K/uL (ref 1.7–7.7)
Neutrophils Relative %: 57 %
Platelets: 210 K/uL (ref 150–400)
RBC: 4.45 MIL/uL (ref 3.87–5.11)
RDW: 13.2 % (ref 11.5–15.5)
WBC: 8.2 K/uL (ref 4.0–10.5)
nRBC: 0 % (ref 0.0–0.2)

## 2024-07-25 LAB — VITAMIN D 25 HYDROXY (VIT D DEFICIENCY, FRACTURES): Vit D, 25-Hydroxy: 42.45 ng/mL (ref 30–100)

## 2024-07-31 ENCOUNTER — Inpatient Hospital Stay: Payer: 59 | Admitting: Oncology

## 2024-08-01 ENCOUNTER — Ambulatory Visit: Admitting: Oncology

## 2024-08-08 ENCOUNTER — Inpatient Hospital Stay (HOSPITAL_BASED_OUTPATIENT_CLINIC_OR_DEPARTMENT_OTHER): Admitting: Oncology

## 2024-08-08 VITALS — BP 118/78 | HR 85 | Temp 97.8°F | Resp 18 | Wt 149.9 lb

## 2024-08-08 DIAGNOSIS — E559 Vitamin D deficiency, unspecified: Secondary | ICD-10-CM | POA: Insufficient documentation

## 2024-08-08 DIAGNOSIS — D0512 Intraductal carcinoma in situ of left breast: Secondary | ICD-10-CM | POA: Diagnosis not present

## 2024-08-08 NOTE — Assessment & Plan Note (Addendum)
-   DEXA scan (01/18/2023): Reviewed by me shows T-score 0.1, normal. - Vitamin D  level is 42.45.  She is taking vitamin D  5000 units daily. - Continue calcium  and vitamin D  supplements.

## 2024-08-08 NOTE — Assessment & Plan Note (Addendum)
-   She is tolerating anastrozole  reasonably well with occasional hot flashes. - Right breast mammogram on 01/21/24 was read as BI-RADS Category 1 negative.  Repeat mammogram annually. -She will complete 5 years of anastrozole  on March 2026. No BCI testing given non invasive BC.  - RTC 6 months for follow-up with repeat labs and exam.

## 2024-08-08 NOTE — Progress Notes (Signed)
   [name] OFFICE PROGRESS NOTE  Sally [name], Sally [name] Area, MD  ASSESSMENT & PLAN:  Assessment & Plan Breast neoplasm, Tis (DCIS), left - She is tolerating anastrozole  reasonably well with occasional hot flashes. - Right breast mammogram on 01/21/24 was read as BI-RADS Category 1 negative.  Repeat mammogram annually. -She will complete 5 years of anastrozole  on March 2026. No BCI testing given non invasive BC.  - RTC 6 months for follow-up with repeat labs and exam.    Vitamin D  deficiency - DEXA scan (01/18/2023): Reviewed by me shows T-score 0.1, normal. - Vitamin D  level is 42.45.  She is taking vitamin D  5000 units daily. - Continue calcium  and vitamin D  supplements.    No orders of the defined types were placed in this encounter.   INTERVAL HISTORY: Patient is here for follow-up for breast cancer.  She denies any new lumps or bumps or concerns for recurrence.  Denies any surgeries, hospitalizations or changes to her baseline health since her last visit.  She was last seen on 01/31/2024.  She continues her anastrozole .  Denies any unwanted side effects.  She continues to take vitamin D  supplements  SUMMARY OF HEMATOLOGIC HISTORY: -Left mastectomy on 02/23/2020 with pathology showing 0.5 cm high-grade DCIS with central necrosis.  Margins negative.  ER/PR 2% positive.  pTis PMX. -Anastrozole  started on 03/11/2020.   2.  Left triple negative breast cancer: -Status post lumpectomy on 11/12/2013. -Status post 6 cycles of TAC from 12/31/2013 through 04/20/2014. -XRT from 05/02/2014 through 07/03/2014.    No results found for: CBC  Vitals:   08/08/24 1052  BP: 118/78  Pulse: 85  Resp: 18  Temp: 97.8 F (36.6 C)  SpO2: 100%   Review of Systems  All other systems reviewed and are negative.   Physical Exam Constitutional:      Appearance: Normal appearance.  Cardiovascular:     Rate and Rhythm: Normal rate and regular rhythm.  Pulmonary:     Effort:  Pulmonary effort is normal.     Breath sounds: Normal breath sounds.  Chest:     Chest wall: No mass or tenderness.  Breasts:    Left: Absent.     Comments: Breast exam unremarkable Abdominal:     General: Bowel sounds are normal.     Palpations: Abdomen is soft.  Musculoskeletal:        General: No swelling. Normal range of motion.  Lymphadenopathy:     Upper Body:     Right upper body: No axillary adenopathy.     Left upper body: No axillary adenopathy.  Neurological:     Mental Status: She is alert and oriented to person, place, and time. Mental status is at baseline.     I spent 20 minutes dedicated to the care of this patient (face-to-face and non-face-to-face) on the date of the encounter to include what is described in the assessment and plan.,  Delon Hope, NP 08/08/2024 10:57 AM

## 2024-12-22 ENCOUNTER — Encounter (INDEPENDENT_AMBULATORY_CARE_PROVIDER_SITE_OTHER): Payer: Self-pay | Admitting: *Deleted

## 2025-01-25 ENCOUNTER — Encounter: Payer: Self-pay | Admitting: *Deleted

## 2025-01-26 ENCOUNTER — Ambulatory Visit (HOSPITAL_COMMUNITY)

## 2025-01-26 ENCOUNTER — Encounter (HOSPITAL_COMMUNITY): Payer: Self-pay

## 2025-01-26 ENCOUNTER — Inpatient Hospital Stay

## 2025-01-29 ENCOUNTER — Inpatient Hospital Stay

## 2025-02-03 ENCOUNTER — Inpatient Hospital Stay

## 2025-02-06 ENCOUNTER — Inpatient Hospital Stay: Admitting: Oncology
